# Patient Record
Sex: Female | Born: 1960 | Race: White | Hispanic: No | Marital: Married | Smoking: Never smoker
Health system: Southern US, Community
[De-identification: ages and names within clinical notes are randomized; demographics above are authoritative.]

## PROBLEM LIST (undated history)

## (undated) DIAGNOSIS — D329 Benign neoplasm of meninges, unspecified: Principal | ICD-10-CM

## (undated) DIAGNOSIS — C801 Malignant (primary) neoplasm, unspecified: Principal | ICD-10-CM

## (undated) DIAGNOSIS — E119 Type 2 diabetes mellitus without complications: Secondary | ICD-10-CM

## (undated) DIAGNOSIS — E785 Hyperlipidemia, unspecified: Secondary | ICD-10-CM

## (undated) DIAGNOSIS — K219 Gastro-esophageal reflux disease without esophagitis: Secondary | ICD-10-CM

## (undated) DIAGNOSIS — I10 Essential (primary) hypertension: Secondary | ICD-10-CM

## (undated) DIAGNOSIS — M7711 Lateral epicondylitis, right elbow: Secondary | ICD-10-CM

## (undated) HISTORY — PX: CHOLECYSTECTOMY: SHX55

## (undated) HISTORY — DX: Hyperlipidemia, unspecified: E78.5

## (undated) HISTORY — DX: Gastro-esophageal reflux disease without esophagitis: K21.9

## (undated) HISTORY — DX: Type 2 diabetes mellitus without complications: E11.9

## (undated) HISTORY — DX: Essential (primary) hypertension: I10

## (undated) HISTORY — PX: SHOULDER ARTHROSCOPY: SHX128

---

## 2003-03-15 ENCOUNTER — Other Ambulatory Visit: Payer: Self-pay

## 2004-06-22 ENCOUNTER — Ambulatory Visit: Payer: Self-pay

## 2004-06-26 ENCOUNTER — Ambulatory Visit: Payer: Self-pay

## 2005-07-25 ENCOUNTER — Emergency Department: Payer: Self-pay | Admitting: Emergency Medicine

## 2005-12-01 ENCOUNTER — Ambulatory Visit: Payer: Self-pay

## 2007-04-04 ENCOUNTER — Ambulatory Visit: Payer: Self-pay | Admitting: Internal Medicine

## 2007-05-30 ENCOUNTER — Ambulatory Visit: Payer: Self-pay | Admitting: Gastroenterology

## 2007-08-09 ENCOUNTER — Ambulatory Visit: Payer: Self-pay | Admitting: Internal Medicine

## 2007-09-14 ENCOUNTER — Ambulatory Visit: Payer: Self-pay | Admitting: Gastroenterology

## 2007-09-18 ENCOUNTER — Ambulatory Visit: Payer: Self-pay | Admitting: Gastroenterology

## 2008-02-12 ENCOUNTER — Ambulatory Visit: Payer: Self-pay | Admitting: Unknown Physician Specialty

## 2008-02-27 ENCOUNTER — Encounter: Payer: Self-pay | Admitting: Unknown Physician Specialty

## 2008-03-10 ENCOUNTER — Encounter: Payer: Self-pay | Admitting: Unknown Physician Specialty

## 2008-04-09 ENCOUNTER — Encounter: Payer: Self-pay | Admitting: Unknown Physician Specialty

## 2008-04-11 ENCOUNTER — Ambulatory Visit: Payer: Self-pay | Admitting: Unknown Physician Specialty

## 2008-04-19 ENCOUNTER — Ambulatory Visit: Payer: Self-pay | Admitting: Unknown Physician Specialty

## 2008-05-10 ENCOUNTER — Encounter: Payer: Self-pay | Admitting: Unknown Physician Specialty

## 2008-05-21 ENCOUNTER — Ambulatory Visit: Payer: Self-pay | Admitting: Internal Medicine

## 2008-06-10 ENCOUNTER — Encounter: Payer: Self-pay | Admitting: Unknown Physician Specialty

## 2008-09-30 ENCOUNTER — Ambulatory Visit: Payer: Self-pay | Admitting: Internal Medicine

## 2008-10-02 ENCOUNTER — Ambulatory Visit: Payer: Self-pay | Admitting: Internal Medicine

## 2008-10-23 ENCOUNTER — Ambulatory Visit: Payer: Self-pay | Admitting: Internal Medicine

## 2009-12-31 ENCOUNTER — Ambulatory Visit: Payer: Self-pay | Admitting: Internal Medicine

## 2010-05-10 HISTORY — PX: BREAST BIOPSY: SHX20

## 2010-05-13 ENCOUNTER — Ambulatory Visit: Payer: Self-pay | Admitting: Internal Medicine

## 2011-02-16 ENCOUNTER — Ambulatory Visit: Payer: Self-pay

## 2011-06-29 ENCOUNTER — Ambulatory Visit: Payer: Self-pay | Admitting: General Practice

## 2011-10-26 ENCOUNTER — Ambulatory Visit: Payer: Self-pay | Admitting: Gastroenterology

## 2012-03-03 ENCOUNTER — Ambulatory Visit: Payer: Self-pay

## 2012-04-11 ENCOUNTER — Ambulatory Visit: Payer: Self-pay | Admitting: Anesthesiology

## 2012-04-11 LAB — BASIC METABOLIC PANEL
Calcium, Total: 10.1 mg/dL (ref 8.5–10.1)
Chloride: 105 mmol/L (ref 98–107)
Co2: 26 mmol/L (ref 21–32)
Creatinine: 0.92 mg/dL (ref 0.60–1.30)
EGFR (African American): >60
EGFR (Non-African Amer.): >60
Glucose: 262 mg/dL — ABNORMAL HIGH (ref 65–99)
Potassium: 5.9 mmol/L — ABNORMAL HIGH (ref 3.5–5.1)
Sodium: 137 mmol/L (ref 136–145)

## 2012-04-11 LAB — HEMOGLOBIN: HGB: 12.4 g/dL (ref 12.0–16.0)

## 2012-04-12 ENCOUNTER — Ambulatory Visit: Payer: Self-pay | Admitting: Surgery

## 2012-05-29 ENCOUNTER — Ambulatory Visit: Payer: Self-pay

## 2013-08-09 DIAGNOSIS — E1122 Type 2 diabetes mellitus with diabetic chronic kidney disease: Secondary | ICD-10-CM | POA: Insufficient documentation

## 2013-08-09 DIAGNOSIS — E1165 Type 2 diabetes mellitus with hyperglycemia: Secondary | ICD-10-CM | POA: Insufficient documentation

## 2013-08-09 DIAGNOSIS — E875 Hyperkalemia: Secondary | ICD-10-CM | POA: Insufficient documentation

## 2014-04-17 ENCOUNTER — Encounter: Attending: Hematology & Oncology | Primary: Internal Medicine

## 2014-04-26 MED ORDER — TAMOXIFEN CITRATE 20 MG PO TABS
20 MG | ORAL_TABLET | Freq: Every day | ORAL | Status: DC
Start: 2014-04-26 — End: 2014-10-28

## 2014-05-01 ENCOUNTER — Encounter: Attending: Hematology & Oncology | Primary: Internal Medicine

## 2014-05-15 ENCOUNTER — Encounter: Attending: Hematology & Oncology | Primary: Internal Medicine

## 2014-07-17 ENCOUNTER — Ambulatory Visit
Admit: 2014-07-17 | Discharge: 2014-07-17 | Payer: MEDICAID | Attending: Hematology & Oncology | Primary: Internal Medicine

## 2014-07-17 DIAGNOSIS — C50912 Malignant neoplasm of unspecified site of left female breast: Secondary | ICD-10-CM

## 2014-07-17 NOTE — Progress Notes (Signed)
HPI  Kelly Miles is a 54 y.o.  female who comes in today for follow up. She feels well.  Apettite  and energy levels are good. No pain. No lumps.  No hot flashes. No vaginal dryness or hair thinning.    she has the following oncology history  1. Left breast cancer T2N1M0, ER/PR+, Her2 negative Kelly Miles lumpectomy.  2. Oncotype done and was low risk. She declined S1005 study and recived XRT subsequently.  3. On tamoxifen 87m daily since September 06, 2012      Review of Systems   Constitutional: Negative for fever, fatigue and unexpected weight change.   HENT: Negative for mouth sores and trouble swallowing.    Eyes: Negative for photophobia and visual disturbance.   Respiratory: Negative for cough and shortness of breath.    Cardiovascular: Negative for chest pain.   Gastrointestinal: Negative for nausea, vomiting, abdominal pain and diarrhea.   Genitourinary: Negative for dysuria and hematuria.   Musculoskeletal: Negative for myalgias and arthralgias.   Skin: Negative for rash.   Neurological: Negative for seizures, light-headedness and headaches.   Hematological: Negative for adenopathy. Does not bruise/bleed easily.   Psychiatric/Behavioral: Negative for sleep disturbance.         Family History   Problem Relation Age of Onset   . Cancer Brother      History   Substance Use Topics   . Smoking status: Former SResearch scientist (life sciences)  . Smokeless tobacco: Not on file   . Alcohol Use: Not on file       Filed Vitals:    07/17/14 1126   BP: 144/84   Pulse: 94   Temp: 98 F (36.7 C)   SpO2: 98%     Physical Exam   Constitutional: She is oriented to person, place, and time. No distress.   HENT:   Head: Normocephalic.   Mouth/Throat: No oropharyngeal exudate.   Eyes: EOM are normal. Pupils are equal, round, and reactive to light. No scleral icterus.   Neck: Neck supple.   Cardiovascular: Normal rate, regular rhythm and normal heart sounds.    Pulmonary/Chest: Breath sounds normal.   Abdominal: Soft. She exhibits no distension and no mass.  There is no tenderness. There is no rebound and no guarding.   Musculoskeletal: Normal range of motion.   Lymphadenopathy:     She has no cervical adenopathy.   Neurological: She is alert and oriented to person, place, and time. No cranial nerve deficit.   Skin: No rash noted.   Psychiatric: She has a normal mood and affect.   breast exam: no lumps.    Last mammogram 11/15: negative    Assessment/Plan:  SLiliaswas seen today for follow-up.    Diagnoses and associated orders for this visit:    Breast cancer, stage 2, left ([place]      1. she appears to be in complete remission. Will do only symptom guided imaging   2. Continue tamoxifen at above dose and schedule. Compliance assessed. Patient is taking medication at appropriate dose and schedule.  3. I have counseled her on diet and exercise.  Patient verbalizes understanding and agrees with the plan  4. Yearly mammograms. Survivorship discussed.

## 2014-08-07 ENCOUNTER — Ambulatory Visit
Admit: 2014-08-07 | Disposition: A | Discharge status: Home / Self Care | Payer: Self-pay | Attending: Internal Medicine | Admitting: Internal Medicine

## 2014-08-21 ENCOUNTER — Encounter: Attending: Surgery | Primary: Internal Medicine

## 2014-08-27 NOTE — Op Note (Signed)
PATIENT NAME:  Chelsea Rivera, Chelsea Rivera MR#:  [id number] DATE OF BIRTH:  05/24/1960  DATE OF PROCEDURE:  04/12/2012  PREOPERATIVE DIAGNOSIS: Chronic cholecystitis.   POSTOPERATIVE DIAGNOSIS: Chronic cholecystitis.   PROCEDURE: Laparoscopic cholecystectomy.   SURGEON: Rodena Goldmann, M.D.   ANESTHESIA: General.   OPERATIVE PROCEDURE: With the patient in the supine position after induction of appropriate general anesthesia, the patient's abdomen was prepped with ChloraPrep and draped with sterile towels. The patient was placed in head down, feet up position. A small infraumbilical incision was made in the standard fashion and carried down bluntly through the subcutaneous tissue. The Veress needle was used to cannulate the peritoneal cavity. CO2 was insufflated to appropriate pressure measurements. Approximately 2.5 liters of CO2 were instilled, the Veress needle was withdrawn, and an 11 mm [employer] port was inserted into the peritoneal cavity. Intraperitoneal position was confirmed. CO2 was reinsufflated. The patient was placed in the head up, feet down position and rotated slightly to the left side. A subxiphoid transverse incision was made and an 11 mm port was inserted under direct vision. Two lateral ports 5 mm in size were inserted under direct vision. The gallbladder had multiple adhesions to it. The gallbladder was retracted superiorly and laterally exposing the hepatoduodenal ligament. The cystic artery and cystic duct were identified. There was a very short cystic duct which was very small. The cystic duct was doubly clipped on both sides and divided. The cystic artery was doubly clipped and divided. The gallbladder was then dissected free from its bed and delivered using hook and cautery apparatus. Once the gallbladder was free, the gallbladder was removed through the upper abdominal port using an 11 mm grasping instrument. The area was then copiously suctioned and irrigated. The midline was  examined from the upper port in order to be sure there was no evidence of any bowel or vascular injuries; none were identified. The abdomen was then significantly irrigated and flushed. All gas and liquid was suctioned from the abdominal cavity. All ports were withdrawn without difficulty. Skin incisions were closed with 5-0 nylon     suture. The area was infiltrated with 0.25% Marcaine for postoperative pain control. Sterile dressings were applied. The patient was returned to the recovery room having tolerated the procedure well. Sponge, instrument, and needle counts were correct x2, in the Operating Room. ____________________________ Rodena Goldmann III, MD rle:slb D: 04/12/2012 08:41:11 ET T: 04/12/2012 09:15:08 ET JOB#: [id number]  cc: Micheline Maze, MD, <Dictator> Lavera Guise, MD Rodena Goldmann MD ELECTRONICALLY SIGNED 04/13/2012 17:10

## 2014-08-28 ENCOUNTER — Ambulatory Visit: Admit: 2014-08-28 | Discharge: 2014-08-28 | Payer: MEDICAID | Attending: Surgery | Primary: Internal Medicine

## 2014-08-28 DIAGNOSIS — Z853 Personal history of malignant neoplasm of breast: Secondary | ICD-10-CM

## 2014-08-28 NOTE — Progress Notes (Signed)
Subjective:     Patient: Kelly Miles is a 54 y.o. female     HPI Patient has had some pain which is unchanged,at the lumpectomy site in the UOQ of the left breast.She has had no nipple discharge and no change in self-exams.She has had no fatigue or weight loss but has had decreased ROM of both shoulders felt to be due to her fibromyalgia.She has had a benign appearing yearly mammogram in November of 2015.    Review of Systems   Constitutional: Negative for appetite change and fatigue.   HENT: Negative for voice change.    Respiratory: Negative for cough and chest tightness.    Gastrointestinal: Negative for nausea, vomiting, abdominal pain, diarrhea, constipation and abdominal distention.   Endocrine: Negative for heat intolerance.   Genitourinary: Negative for dysuria, frequency and vaginal bleeding.   Musculoskeletal: Negative for myalgias and arthralgias.   Neurological: Negative for weakness and headaches.   Hematological: Negative for adenopathy.   Psychiatric/Behavioral: Negative for suicidal ideas. The patient is not nervous/anxious.         Allergies   Allergen Reactions   ??? Influenza Vaccines      Current Outpatient Prescriptions on File Prior to Visit   Medication Sig Dispense Refill   ??? SYNTHROID 125 MCG tablet      ??? losartan (COZAAR) 100 MG tablet      ??? traMADol (ULTRAM) 50 MG tablet      ??? Multiple Vitamins-Iron (DAILY-VITE/IRON/BETA-CAROTENE) TABS      ??? vitamin D (CHOLECALCIFEROL) 1000 UNIT TABS tablet Take 1,000 Units by mouth daily     ??? tamoxifen (NOLVADEX) 20 MG tablet Take 1 tablet by mouth daily 30 tablet 5   ??? cetirizine (ZYRTEC) 10 MG tablet        No current facility-administered medications on file prior to visit.      Past Medical History   Diagnosis Date   ??? Cancer ([place])    ??? Chronic back pain    ??? Fibromyalgia    ??? Hypertension    ??? Allergic rhinitis    ??? Hypothyroidism    ??? Irritable bowel syndrome    ??? Restless legs syndrome    ??? Neuropathy       Past Surgical History   Procedure  Laterality Date   ??? Cholecystectomy     ??? Tonsillectomy and adenoidectomy     ??? Breast surgery     ??? Inner ear surgery       both sides   ??? Colonoscopy       Family History   Problem Relation Age of Onset   ??? Cancer Brother      History   Substance Use Topics   ??? Smoking status: Former Smoker   ??? Smokeless tobacco: Not on file   ??? Alcohol Use: Not on file          Objective:     BP 117/70 mmHg   Pulse 87   Ht 5\' 6"  (1.676 m)   Wt 167 lb 12.8 oz (76.114 kg)   BMI 27.10 kg/m2    Physical Exam   Constitutional: Vital signs are normal. She appears well-developed and well-nourished. She is cooperative.   HENT:   Head: Normocephalic.   Eyes: Pupils are equal, round, and reactive to light.   Neck: No tracheal deviation present. No thyromegaly present.   Cardiovascular: Normal rate and normal heart sounds.    No murmur heard.  Pulmonary/Chest: She has no wheezes. She  has no rales. She exhibits no tenderness.       The left breast is surgically smaller than the right and the retracted scar in the UOQ of the left breast is unchanged.There are no masses in either breast or axilla and no supraclavicular masses   Abdominal: She exhibits no distension, no fluid wave and no abdominal bruit. There is no hepatosplenomegaly. There is no guarding, no tenderness at McBurney's point and negative Murphy's sign. No hernia.   Musculoskeletal: She exhibits no edema.   Lymphadenopathy:     She has no cervical adenopathy.   Neurological: No cranial nerve deficit. Coordination normal.   Skin: No rash noted.   Psychiatric: She has a normal mood and affect.       Assessment      1. Personal history of breast cancer         Plan      Follow up in one year,continue with mammograms every November;continue self-exams    Roe Coombs, MD  08/28/14  11:18 AM      Health Maintenance Due   Topic Date Due   ??? HEPATITIS  C SCREENING  1960-11-21   ??? LIPIDS  09/16/1968   ??? TETANUS VACCINE ADULT (11 YEARS AND UP)  09/16/1969   ??? HIV SCREENING   09/16/1973   ??? CERVICAL CANCER SCREENING  09/17/1979   ??? DIABETES SCREENING  09/17/1998   ??? COLON CANCER SCREENING COLONOSCOPY  09/16/2008

## 2014-08-29 DIAGNOSIS — M7711 Lateral epicondylitis, right elbow: Secondary | ICD-10-CM | POA: Insufficient documentation

## 2014-08-29 DIAGNOSIS — M25521 Pain in right elbow: Secondary | ICD-10-CM | POA: Insufficient documentation

## 2014-10-28 MED ORDER — TAMOXIFEN CITRATE 20 MG PO TABS
20 | ORAL_TABLET | ORAL | 5 refills | Status: DC
Start: 2014-10-28 — End: 2015-04-28

## 2015-01-15 ENCOUNTER — Encounter: Attending: Hematology & Oncology | Primary: Internal Medicine

## 2015-03-07 ENCOUNTER — Ambulatory Visit
Admit: 2015-03-07 | Discharge: 2015-03-07 | Payer: MEDICAID | Attending: Hematology & Oncology | Primary: Internal Medicine

## 2015-03-07 DIAGNOSIS — C50912 Malignant neoplasm of unspecified site of left female breast: Secondary | ICD-10-CM

## 2015-03-07 NOTE — Progress Notes (Signed)
C/o pain in shoulders/back thinks it is worse since having radiation / not sleeping much / numb, tingling hands/legs charlie horse in legs at night

## 2015-03-07 NOTE — Progress Notes (Signed)
HPI  Kelly Miles is a 54 y.o.  female who comes in today for follow up. She feels well.  Apettite  and energy levels are good. No pain. No lumps.  No hot flashes. No vaginal dryness or hair thinning.  She is complaining of stiffness and achiness mainly in her left shoulder.    she has the following oncology history  1. Left breast cancer T2N1M0, ER/PR+, Her2 negative s/p lumpectomy.  2. Oncotype done and was low risk. She declined S1005 study and recived XRT subsequently.  3. On tamoxifen 28m daily since September 06, 2012      Review of Systems   Constitutional: Negative for fatigue, fever and unexpected weight change.   HENT: Negative for mouth sores and trouble swallowing.    Eyes: Negative for photophobia and visual disturbance.   Respiratory: Negative for cough and shortness of breath.    Cardiovascular: Negative for chest pain.   Gastrointestinal: Negative for abdominal pain, diarrhea, nausea and vomiting.   Genitourinary: Negative for dysuria and hematuria.   Musculoskeletal: Negative for arthralgias and myalgias.   Skin: Negative for rash.   Neurological: Negative for seizures, light-headedness and headaches.   Hematological: Negative for adenopathy. Does not bruise/bleed easily.   Psychiatric/Behavioral: Negative for sleep disturbance.         Family History   Problem Relation Age of Onset   . Cancer Brother      Social History   Substance Use Topics   . Smoking status: Former SResearch scientist (life sciences)  . Smokeless tobacco: Not on file   . Alcohol use Not on file       Vitals:    03/07/15 1134   BP: 134/83   Pulse: 96   Temp: 98.6 F (37 C)   SpO2: 98%     Physical Exam   Constitutional: She is oriented to person, place, and time. No distress.   HENT:   Head: Normocephalic.   Mouth/Throat: No oropharyngeal exudate.   Eyes: EOM are normal. Pupils are equal, round, and reactive to light. No scleral icterus.   Neck: Neck supple.   Cardiovascular: Normal rate, regular rhythm and normal heart sounds.    Pulmonary/Chest: Breath  sounds normal.   Abdominal: Soft. She exhibits no distension and no mass. There is no tenderness. There is no rebound and no guarding.   Musculoskeletal: Normal range of motion.   Lymphadenopathy:     She has no cervical adenopathy.   Neurological: She is alert and oriented to person, place, and time. No cranial nerve deficit.   Skin: No rash noted.   Psychiatric: She has a normal mood and affect.   breast exam: no lumps.    Last mammogram 11/15: negative    Assessment/Plan:  SJordanwas seen today for follow-up.    Diagnoses and all orders for this visit:    Breast cancer, stage 2, left ([place]  -     PT-[facility], [facility]   1. she appears to be in complete remission. Will do only symptom guided imaging     2.  Left breast cancer stage II ER/PR positive HER-2/neu negative.  Drug:  Tamoxifen 20 mg p.o. daily.  Treatment intent is curative.  Timing is adjuvant.  Planned duration is 5 for 10 years.  Response Rate: NA since adjuvant.  I have discussed some of the side efffects that include nausea, vomiting, alopecia, infection, myelosuppression,  Prognosis: Five-year disease-free survival is 80%.  NCCN guidelines were used.  She  consents to treatment. Shared decision making was performed.    3. I have counseled her on diet and exercise.  Physical therapy as above.  Patient verbalizes understanding and agrees with the plan    4. Yearly mammograms.  Next one due in November 2016.

## 2015-03-07 NOTE — Telephone Encounter (Signed)
Pt here and was given phone number to [facility] at [facility] for PT. Pt to call and set up appt. Faxed order//Form. No precert required [facility]. Lattie Haw

## 2015-04-22 ENCOUNTER — Other Ambulatory Visit: Payer: Self-pay | Admitting: Nurse Practitioner

## 2015-04-22 DIAGNOSIS — Z1231 Encounter for screening mammogram for malignant neoplasm of breast: Secondary | ICD-10-CM

## 2015-04-28 MED ORDER — TAMOXIFEN CITRATE 20 MG PO TABS
20 MG | ORAL_TABLET | ORAL | 3 refills | Status: DC
Start: 2015-04-28 — End: 2015-08-28

## 2015-08-08 ENCOUNTER — Ambulatory Visit
Admission: RE | Admit: 2015-08-08 | Discharge: 2015-08-08 | Disposition: A | Discharge status: Home / Self Care | Payer: Managed Care, Other (non HMO) | Source: Ambulatory Visit | Attending: Nurse Practitioner | Admitting: Nurse Practitioner

## 2015-08-08 DIAGNOSIS — Z1231 Encounter for screening mammogram for malignant neoplasm of breast: Secondary | ICD-10-CM | POA: Insufficient documentation

## 2015-08-28 MED ORDER — TAMOXIFEN CITRATE 20 MG PO TABS
20 | ORAL_TABLET | ORAL | 5 refills | Status: DC
Start: 2015-08-28 — End: 2016-02-26

## 2015-09-01 ENCOUNTER — Encounter: Attending: Hematology & Oncology | Primary: Internal Medicine

## 2015-09-05 ENCOUNTER — Encounter: Attending: Hematology & Oncology | Primary: Internal Medicine

## 2015-10-08 ENCOUNTER — Other Ambulatory Visit: Payer: Self-pay

## 2015-10-08 ENCOUNTER — Encounter (INDEPENDENT_AMBULATORY_CARE_PROVIDER_SITE_OTHER): Payer: Self-pay

## 2015-10-08 DIAGNOSIS — Z299 Encounter for prophylactic measures, unspecified: Secondary | ICD-10-CM

## 2015-10-08 NOTE — Progress Notes (Signed)
Patient came in to have blood drawn per Dr. Leretha Pol at [facility] orders.  Blood was drawn from the right arm without any incident.

## 2015-10-09 LAB — CMP12+LP+TP+TSH+6AC+CBC/D/PLT
A/G RATIO: 1.5 (ref 1.2–2.2)
ALT: 13 IU/L (ref 0–32)
AST: 14 IU/L (ref 0–40)
Albumin: 4.1 g/dL (ref 3.5–5.5)
Alkaline Phosphatase: 73 IU/L (ref 39–117)
BASOS ABS: 0 10*3/uL (ref 0.0–0.2)
BILIRUBIN TOTAL: 0.2 mg/dL (ref 0.0–1.2)
BUN / CREAT RATIO: 24 — AB (ref 9–23)
BUN: 24 mg/dL (ref 6–24)
Basos: 1 %
CREATININE: 0.99 mg/dL (ref 0.57–1.00)
Calcium: 9.7 mg/dL (ref 8.7–10.2)
Chloride: 100 mmol/L (ref 96–106)
Chol/HDL Ratio: 2.8 ratio units (ref 0.0–4.4)
Cholesterol, Total: 150 mg/dL (ref 100–199)
EOS (ABSOLUTE): 0.3 10*3/uL (ref 0.0–0.4)
EOS: 4 %
Estimated CHD Risk: <0.5 times avg. (ref 0.0–1.0)
Free Thyroxine Index: 2.2 (ref 1.2–4.9)
GFR calc Af Amer: 73 mL/min/{1.73_m2} (ref >59)
GFR, EST NON AFRICAN AMERICAN: 63 mL/min/{1.73_m2} (ref >59)
GGT: 8 IU/L (ref 0–60)
GLUCOSE: 184 mg/dL — AB (ref 65–99)
Globulin, Total: 2.7 g/dL (ref 1.5–4.5)
HDL: 54 mg/dL (ref >39)
HEMOGLOBIN: 11.3 g/dL (ref 11.1–15.9)
Hematocrit: 35.9 % (ref 34.0–46.6)
IMMATURE GRANS (ABS): 0 10*3/uL (ref 0.0–0.1)
IMMATURE GRANULOCYTES: 0 %
IRON: 42 ug/dL (ref 27–159)
LDH: 157 IU/L (ref 119–226)
LDL Calculated: 80 mg/dL (ref 0–99)
LYMPHS: 35 %
Lymphocytes Absolute: 2.2 10*3/uL (ref 0.7–3.1)
MCH: 27.4 pg (ref 26.6–33.0)
MCHC: 31.5 g/dL (ref 31.5–35.7)
MCV: 87 fL (ref 79–97)
MONOCYTES: 7 %
Monocytes Absolute: 0.4 10*3/uL (ref 0.1–0.9)
NEUTROS PCT: 53 %
Neutrophils Absolute: 3.3 10*3/uL (ref 1.4–7.0)
PLATELETS: 275 10*3/uL (ref 150–379)
Phosphorus: 4.1 mg/dL (ref 2.5–4.5)
Potassium: 5.2 mmol/L (ref 3.5–5.2)
RBC: 4.13 x10E6/uL (ref 3.77–5.28)
RDW: 15.1 % (ref 12.3–15.4)
Sodium: 139 mmol/L (ref 134–144)
T3 UPTAKE RATIO: 30 % (ref 24–39)
T4, Total: 7.4 ug/dL (ref 4.5–12.0)
TOTAL PROTEIN: 6.8 g/dL (ref 6.0–8.5)
TSH: 2.15 u[IU]/mL (ref 0.450–4.500)
Triglycerides: 80 mg/dL (ref 0–149)
URIC ACID: 6.6 mg/dL (ref 2.5–7.1)
VLDL CHOLESTEROL CAL: 16 mg/dL (ref 5–40)
WBC: 6.2 10*3/uL (ref 3.4–10.8)

## 2015-10-09 LAB — MICROALBUMIN / CREATININE URINE RATIO
Creatinine, Urine: 103.8 mg/dL
MICROALB/CREAT RATIO: 15.7 mg/g creat (ref 0.0–30.0)
Microalbumin, Urine: 16.3 ug/mL

## 2015-10-09 LAB — T4, FREE: Free T4: 1.32 ng/dL (ref 0.82–1.77)

## 2015-10-09 LAB — HGB A1C W/O EAG: Hgb A1c MFr Bld: 8.8 % — ABNORMAL HIGH (ref 4.8–5.6)

## 2015-10-09 LAB — VITAMIN D 25 HYDROXY (VIT D DEFICIENCY, FRACTURES): Vit D, 25-Hydroxy: 40.6 ng/mL (ref 30.0–100.0)

## 2016-02-26 MED ORDER — TAMOXIFEN CITRATE 20 MG PO TABS
20 MG | ORAL_TABLET | Freq: Every day | ORAL | 0 refills | Status: DC
Start: 2016-02-26 — End: 2016-04-05

## 2016-02-26 NOTE — Telephone Encounter (Signed)
Pharmacy called requesting refill on Tamoxifen 20 mg

## 2016-02-26 NOTE — Telephone Encounter (Signed)
Left message for pt to call and schedule folllow-up visit.

## 2016-03-15 ENCOUNTER — Ambulatory Visit
Admit: 2016-03-15 | Discharge: 2016-03-15 | Payer: MEDICAID | Attending: Hematology & Oncology | Primary: Internal Medicine

## 2016-03-15 DIAGNOSIS — C50912 Malignant neoplasm of unspecified site of left female breast: Secondary | ICD-10-CM

## 2016-03-15 NOTE — Progress Notes (Signed)
HPI  Kelly Miles is a 55 y.o.  female who comes in today for follow up. She feels well.  Apettite  and energy levels are good. No pain. No lumps.  No hot flashes. No vaginal dryness or hair thinning.      she has the following oncology history  1. Left breast cancer T2N1M0, ER/PR+, Her2 negative s/p lumpectomy.  2. Oncotype done and was low risk. She declined S1005 study and recived XRT subsequently.  3. On tamoxifen 55m daily since September 06, 2012      Review of Systems   Constitutional: Negative for fatigue, fever and unexpected weight change.   HENT: Negative for mouth sores and trouble swallowing.    Eyes: Negative for photophobia and visual disturbance.   Respiratory: Negative for cough and shortness of breath.    Cardiovascular: Negative for chest pain.   Gastrointestinal: Negative for abdominal pain, diarrhea, nausea and vomiting.   Genitourinary: Negative for dysuria and hematuria.   Musculoskeletal: Negative for arthralgias and myalgias.   Skin: Negative for rash.   Neurological: Negative for seizures, light-headedness and headaches.   Hematological: Negative for adenopathy. Does not bruise/bleed easily.   Psychiatric/Behavioral: Negative for sleep disturbance.         Family History   Problem Relation Age of Onset   . Cancer Brother      Social History   Substance Use Topics   . Smoking status: Former SResearch scientist (life sciences)  . Smokeless tobacco: Never Used   . Alcohol use Not on file       Vitals:    03/15/16 1110   BP: (!) 151/89   Pulse: 113   Temp: 97.8 F (36.6 C)   SpO2: 100%     Physical Exam   Constitutional: She is oriented to person, place, and time. No distress.   HENT:   Head: Normocephalic.   Mouth/Throat: No oropharyngeal exudate.   Eyes: EOM are normal. Pupils are equal, round, and reactive to light. No scleral icterus.   Neck: Neck supple.   Cardiovascular: Normal rate, regular rhythm and normal heart sounds.    Pulmonary/Chest: Breath sounds normal.   Abdominal: Soft. She exhibits no distension and  no mass. There is no tenderness. There is no rebound and no guarding.   Musculoskeletal: Normal range of motion.   Lymphadenopathy:     She has no cervical adenopathy.   Neurological: She is alert and oriented to person, place, and time. No cranial nerve deficit.   Skin: No rash noted.   Psychiatric: She has a normal mood and affect.   breast exam: no lumps.    Last mammogram 09/27/2015 was negative.    Assessment/Plan:  SSusywas seen today for follow-up.    Diagnoses and all orders for this visit:    Breast cancer, stage 2, left ([place]    1. she appears to be in complete remission. Will do only symptom guided imaging. Follow-up in 1 year for office visit. She will need distress and depression screening at that visit.    2.  Left breast cancer stage II ER/PR positive HER-2/neu negative.  Drug:  Tamoxifen 20 mg p.o. daily.  Treatment intent is curative.  Timing is adjuvant.  Planned duration is 5 for 10 years.  Response Rate: NA since adjuvant.  I have discussed some of the side efffects that include nausea, vomiting, alopecia, infection, myelosuppression,  Prognosis: Five-year disease-free survival is 80%.  NCCN guidelines were used.  She consents to treatment. Shared decision  making was performed.    3. I have counseled her on diet and exercise.  Patient verbalizes understanding and agrees with the plan    4. Yearly mammograms.  Next one due Feb 2018.

## 2016-04-05 MED ORDER — TAMOXIFEN CITRATE 20 MG PO TABS
20 MG | ORAL_TABLET | Freq: Every day | ORAL | 5 refills | Status: DC
Start: 2016-04-05 — End: 2016-09-23

## 2016-04-05 NOTE — Telephone Encounter (Signed)
Patient requesting a refill for Tamoxifen 20 MG to be sent to the [facility] on file.  She would like a call to let her know it was sent to the pharmacy.

## 2016-05-14 ENCOUNTER — Ambulatory Visit: Payer: Self-pay | Admitting: Physician Assistant

## 2016-05-14 VITALS — BP 101/60 | HR 86 | Temp 98.6°F

## 2016-05-14 DIAGNOSIS — J101 Influenza due to other identified influenza virus with other respiratory manifestations: Secondary | ICD-10-CM

## 2016-05-14 LAB — POCT INFLUENZA A/B
INFLUENZA A, POC: NEGATIVE
INFLUENZA B, POC: POSITIVE — AB

## 2016-05-14 NOTE — Progress Notes (Signed)
S: C/o runny nose and congestion with dry cough for 2 days, + fever, chills, denies cp/sob, v/d; mucus was green this am but clear throughout the day, cough is sporadic, friend that is staying with them is + flu  Using otc meds: robitussin  O: PE: vitals wnl, nad,  perrl eomi, normocephalic, tms dull, nasal mucosa red and swollen, throat injected, neck supple no lymph, lungs c t a, cv rrr, neuro intact, flu swab   A:  Acute influenza B   P: drink fluids, continue regular meds , use otc meds of choice, return if not improving in 5 days, return earlier if worsening , tamiflu, tussionex, omnicef if secondary infection, preventive tamiflu for her husband Asheley Tongate,

## 2016-05-17 ENCOUNTER — Ambulatory Visit: Payer: Self-pay | Admitting: Physician Assistant

## 2016-05-17 ENCOUNTER — Encounter: Payer: Self-pay | Admitting: Physician Assistant

## 2016-05-17 VITALS — BP 110/60 | HR 76 | Temp 97.8°F

## 2016-05-17 DIAGNOSIS — R0789 Other chest pain: Secondary | ICD-10-CM

## 2016-05-17 NOTE — Progress Notes (Signed)
S: here for recheck, had +flu B last week, taking tamiflu and also omnicef to prevent secondary infection, had some chest soreness and discomfort over the weekend, some in her back also, no radiation of pain, no sob or sweating but did get nauseated, no fever/chills, no v/d, sx are better but still really sore, ?if we could just listen to her chest to make sure its clear  O: vitals wnl, nad, tms clear, throat wnl, neck supple no lymph, lungs c t a, cv rrr, muscles in upper shoulders tight, chest and back nontender to palp, n/v intact, ekg wnl  A: chest discomfort secondary to flu B  P: return if worsening, call 911 if increased chest pain and go to ER, finish medications

## 2016-07-05 ENCOUNTER — Encounter: Payer: Self-pay | Admitting: Physician Assistant

## 2016-07-05 ENCOUNTER — Ambulatory Visit: Payer: Self-pay | Admitting: Physician Assistant

## 2016-07-05 VITALS — BP 100/60 | HR 80 | Temp 98.0°F

## 2016-07-05 DIAGNOSIS — N390 Urinary tract infection, site not specified: Secondary | ICD-10-CM

## 2016-07-05 LAB — POCT URINALYSIS DIPSTICK
Bilirubin, UA: NEGATIVE
NITRITE UA: POSITIVE
Spec Grav, UA: 1.025
Urobilinogen, UA: 0.2
pH, UA: 5

## 2016-07-05 MED ORDER — FLUCONAZOLE 150 MG PO TABS: 150.0000 mg | ORAL_TABLET | Freq: Once | ORAL | 0 refills | Status: AC

## 2016-07-05 MED ORDER — CIPROFLOXACIN HCL 250 MG PO TABS: 250.0000 mg | ORAL_TABLET | Freq: Two times a day (BID) | ORAL | 0 refills | Status: DC

## 2016-07-05 NOTE — Progress Notes (Signed)
S:  C/o uti sx for 2 weeks, burning, urgency, frequency, denies vaginal discharge, abdominal pain or flank pain: tried cranberry pills without relief;  Remainder ros neg  O:  Vitals wnl, nad, no cva tenderness, back nontender, lungs c t a,cv rrr, abd soft nontender, bs normal, n/v intact, ua 1+ leuks, +nitrites  A: uti  P: cipro 250mg  bid x 7d, diflucan if needed, increase water intake, add cranberry juice, return if not improving in 2 -3 days, return earlier if worsening, discussed pyelonephritis sx

## 2016-07-22 ENCOUNTER — Encounter: Primary: Internal Medicine

## 2016-08-16 ENCOUNTER — Other Ambulatory Visit: Payer: Self-pay | Admitting: Nurse Practitioner

## 2016-08-16 DIAGNOSIS — Z1231 Encounter for screening mammogram for malignant neoplasm of breast: Secondary | ICD-10-CM

## 2016-08-25 NOTE — Other (Unsigned)
Patient Acct Nbr: [id number]   Primary AUTH/CERT:   [place] Name: [place] name: [facility]  Primary Insurance Group Number:   Primary Insurance Plan Type: Health  Primary Insurance Policy Number: [id number]

## 2016-08-26 ENCOUNTER — Inpatient Hospital Stay: Attending: Surgery | Primary: Internal Medicine

## 2016-09-01 ENCOUNTER — Ambulatory Visit: Admit: 2016-09-01 | Discharge: 2016-09-01 | Payer: MEDICAID | Attending: Surgery | Primary: Internal Medicine

## 2016-09-01 DIAGNOSIS — C50912 Malignant neoplasm of unspecified site of left female breast: Secondary | ICD-10-CM

## 2016-09-01 NOTE — Progress Notes (Signed)
Subjective:     Patient: Kelly Miles is a 56 y.o. female     HPI patient has a greater than 10 year history of fibromyalgia and she has had chronic breast pain left greater than right since I have known her.  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  She has been on tamoxifen since 2014.  She has a low Oncotype score and no chemotherapy.  She has had no nipple discharge mastalgia or change in self exam.  She has had continued pain unchanged since her surgery and radiation therapy.  Unfortunately, the breast center would not do her mammogram in November and did not notify my office so that is over a year since her last mammogram    Review of Systems   Constitutional: Negative for appetite change and fatigue.   HENT: Negative for voice change.    Respiratory: Negative for cough and chest tightness.    Gastrointestinal: Negative for abdominal distention, abdominal pain, constipation, diarrhea, nausea and vomiting.   Endocrine: Negative for heat intolerance.   Genitourinary: Negative for dysuria, frequency and vaginal bleeding.   Musculoskeletal: Negative for arthralgias and myalgias.   Neurological: Negative for weakness and headaches.   Hematological: Negative for adenopathy.   Psychiatric/Behavioral: Negative for suicidal ideas. The patient is not nervous/anxious.         Allergies   Allergen Reactions   . Influenza Vaccines Nausea And Vomiting     Current Outpatient Prescriptions on File Prior to Visit   Medication Sig Dispense Refill   . tamoxifen (NOLVADEX) 20 MG tablet Take 1 tablet by mouth daily 30 tablet 5   . Multiple Vitamins-Minerals (THERAPEUTIC MULTIVITAMIN-MINERALS) tablet Take 1 tablet by mouth daily     . meloxicam (MOBIC) 7.5 MG tablet      . ibuprofen (ADVIL;MOTRIN) 800 MG tablet   2   . cyclobenzaprine (FLEXERIL) 10 MG tablet   5   .  SYNTHROID 125 MCG tablet      . losartan (COZAAR) 100 MG tablet      . traMADol (ULTRAM) 50 MG tablet      . cetirizine (ZYRTEC) 10 MG tablet      . vitamin D (CHOLECALCIFEROL) 1000 UNIT TABS tablet Take 1,000 Units by mouth daily       No current facility-administered medications on file prior to visit.       Past Medical History:   Diagnosis Date   . Allergic rhinitis    . Cancer ([place])    . Chronic back pain    . Fibromyalgia    . Hypertension    . Hypothyroidism    . Irritable bowel syndrome    . Neuropathy ([place])    . Osteoarthritis     back   . Restless legs syndrome       Past Surgical History:   Procedure Laterality Date   . BREAST SURGERY     . CHOLECYSTECTOMY     . COLONOSCOPY     . INNER EAR SURGERY      both sides   . TONSILLECTOMY AND ADENOIDECTOMY       Family History   Problem Relation Age of Onset   . Cancer Brother    . Other Brother      allergies   . High Blood Pressure Mother    . Stroke  Mother    . Heart Disease Father    . High Blood Pressure Sister    . Other Sister      allergies     Social History   Substance Use Topics   . Smoking status: Former Research scientist (life sciences)   . Smokeless tobacco: Never Used   . Alcohol use No          Objective:     BP (!) 187/98   Pulse 110   Ht 5' 6"  (1.676 m)   Wt 181 lb 9.6 oz (82.4 kg)   Breastfeeding? No   BMI 29.31 kg/m     Physical Exam   Constitutional: Vital signs are normal. She appears well-developed and well-nourished. She is cooperative.   HENT:   Head: Normocephalic.   Eyes: Pupils are equal, round, and reactive to light.   Neck: No tracheal deviation present. No thyromegaly present.   Cardiovascular: Normal rate and normal heart sounds.    No murmur heard.  Pulmonary/Chest: She has no wheezes. She has no rales. She exhibits no tenderness.       Patient's left breast is surgically smaller than the right and there are some retraction of the scar which is chronic in nature on the left side there is some firmness at the superior aspect of the midportion of the  scar in the left breast unchanged    There are no discrete masses in either breast or axilla no nipple discharge no nipple or skin retraction on the right side there is no axillary mass on either side no cervical or supra clavicular masses on either side   Abdominal: She exhibits no distension, no fluid wave and no abdominal bruit. There is no hepatosplenomegaly. There is no guarding, no tenderness at McBurney's point and negative Murphy's sign. No hernia.   Musculoskeletal: She exhibits no edema.   Lymphadenopathy:     She has no cervical adenopathy.   Neurological: No cranial nerve deficit. Coordination normal.   Skin: No rash noted.   Psychiatric: She has a normal mood and affect.       Assessment      1. Breast cancer, stage 2, left ([place])         Plan      1. Breast cancer, stage 2, left ([facility])  Obtain diagnostic bilateral mammogram and left breast ultrasound-patient's mastalgia is most probably chronic in nature and related both to her breast cancer treatment and her underlying fibromyalgia; we will call her with the imaging reports and I will see her again in 6 months and she will call 30 problems    Kelly Coombs, MD  09/01/16  9:43 AM      Health Maintenance Due   Topic Date Due   . Potassium monitoring  [birth year]   . Creatinine monitoring  1961-03-09   . Hepatitis C screen  1960/11/06   . HIV screen  09/16/1973   . DTaP/Tdap/Td vaccine (1 - Tdap) 09/16/1977   . Cervical cancer screen  09/17/1979   . Lipid screen  09/17/1998   . Diabetes screen  09/17/1998   . Shingles Vaccine (1 of 2 - 2 Dose Series) 09/16/2008   . Colon cancer screen colonoscopy  09/16/2008

## 2016-09-08 ENCOUNTER — Ambulatory Visit
Admission: RE | Admit: 2016-09-08 | Discharge: 2016-09-08 | Disposition: A | Discharge status: Home / Self Care | Payer: Managed Care, Other (non HMO) | Source: Ambulatory Visit | Attending: Nurse Practitioner | Admitting: Nurse Practitioner

## 2016-09-08 DIAGNOSIS — Z1231 Encounter for screening mammogram for malignant neoplasm of breast: Secondary | ICD-10-CM | POA: Diagnosis present

## 2016-09-24 ENCOUNTER — Inpatient Hospital Stay: Admit: 2016-09-24 | Attending: Surgery | Primary: Internal Medicine

## 2016-09-24 DIAGNOSIS — C50912 Malignant neoplasm of unspecified site of left female breast: Secondary | ICD-10-CM

## 2016-09-24 MED ORDER — TAMOXIFEN CITRATE 20 MG PO TABS
20 MG | ORAL_TABLET | ORAL | 3 refills | Status: DC
Start: 2016-09-24 — End: 2018-03-21

## 2016-09-24 NOTE — Other (Unsigned)
Patient Acct Nbr: [id number]   Primary AUTH/CERT:   [place] Name: [place] name: [facility]  Primary Insurance Group Number:   Primary Insurance Plan Type: Health  Primary Insurance Policy Number: [id number]

## 2016-09-27 NOTE — Telephone Encounter (Signed)
Name of Caller: Cadey Bazile phone number: [phone]    Relationship to Patient: patient    Chief Complaint/Reason for Call: Pt calling for mammogram results. Please call back to discuss.    Best time of day caller can be reached: Any      Patient advised that office/PCP has 24-48 business hours to return their call: Yes

## 2016-09-28 NOTE — Telephone Encounter (Signed)
Left message that mammogram was benign, recheck in one year.

## 2017-02-25 ENCOUNTER — Other Ambulatory Visit: Payer: Self-pay

## 2017-02-25 DIAGNOSIS — Z299 Encounter for prophylactic measures, unspecified: Secondary | ICD-10-CM

## 2017-02-25 NOTE — Progress Notes (Signed)
Patient came in to have blood drawn for testing per Dr. Etta Quill orders.

## 2017-02-26 LAB — CMP12+LP+TP+TSH+6AC+CBC/D/PLT
A/G RATIO: 1.6 (ref 1.2–2.2)
ALBUMIN: 4.1 g/dL (ref 3.5–5.5)
ALT: 15 IU/L (ref 0–32)
AST: 15 IU/L (ref 0–40)
Alkaline Phosphatase: 72 IU/L (ref 39–117)
BUN / CREAT RATIO: 25 — AB (ref 9–23)
BUN: 25 mg/dL — AB (ref 6–24)
Basophils Absolute: 0 10*3/uL (ref 0.0–0.2)
Basos: 1 %
Bilirubin Total: <0.2 mg/dL (ref 0.0–1.2)
CHOL/HDL RATIO: 2.8 ratio (ref 0.0–4.4)
CREATININE: 0.99 mg/dL (ref 0.57–1.00)
Calcium: 9.6 mg/dL (ref 8.7–10.2)
Chloride: 109 mmol/L — ABNORMAL HIGH (ref 96–106)
Cholesterol, Total: 149 mg/dL (ref 100–199)
EOS (ABSOLUTE): 0.2 10*3/uL (ref 0.0–0.4)
Eos: 4 %
Free Thyroxine Index: 2.2 (ref 1.2–4.9)
GFR calc Af Amer: 73 mL/min/{1.73_m2} (ref >59)
GFR, EST NON AFRICAN AMERICAN: 63 mL/min/{1.73_m2} (ref >59)
GGT: 8 IU/L (ref 0–60)
GLUCOSE: 97 mg/dL (ref 65–99)
Globulin, Total: 2.6 g/dL (ref 1.5–4.5)
HDL: 54 mg/dL (ref >39)
Hematocrit: 33.2 % — ABNORMAL LOW (ref 34.0–46.6)
Hemoglobin: 10.5 g/dL — ABNORMAL LOW (ref 11.1–15.9)
Immature Grans (Abs): 0 10*3/uL (ref 0.0–0.1)
Immature Granulocytes: 0 %
Iron: 46 ug/dL (ref 27–159)
LDH: 182 IU/L (ref 119–226)
LDL Calculated: 81 mg/dL (ref 0–99)
LYMPHS ABS: 2 10*3/uL (ref 0.7–3.1)
Lymphs: 33 %
MCH: 27.3 pg (ref 26.6–33.0)
MCHC: 31.6 g/dL (ref 31.5–35.7)
MCV: 86 fL (ref 79–97)
Monocytes Absolute: 0.4 10*3/uL (ref 0.1–0.9)
Monocytes: 7 %
NEUTROS ABS: 3.4 10*3/uL (ref 1.4–7.0)
Neutrophils: 55 %
PHOSPHORUS: 3.6 mg/dL (ref 2.5–4.5)
POTASSIUM: 6.2 mmol/L — AB (ref 3.5–5.2)
Platelets: 260 10*3/uL (ref 150–379)
RBC: 3.85 x10E6/uL (ref 3.77–5.28)
RDW: 14.5 % (ref 12.3–15.4)
Sodium: 145 mmol/L — ABNORMAL HIGH (ref 134–144)
T3 Uptake Ratio: 28 % (ref 24–39)
T4 TOTAL: 8 ug/dL (ref 4.5–12.0)
TOTAL PROTEIN: 6.7 g/dL (ref 6.0–8.5)
TSH: 2.62 u[IU]/mL (ref 0.450–4.500)
Triglycerides: 68 mg/dL (ref 0–149)
URIC ACID: 7.1 mg/dL (ref 2.5–7.1)
VLDL Cholesterol Cal: 14 mg/dL (ref 5–40)
WBC: 6.1 10*3/uL (ref 3.4–10.8)

## 2017-02-26 LAB — VITAMIN D 25 HYDROXY (VIT D DEFICIENCY, FRACTURES): Vit D, 25-Hydroxy: 29.5 ng/mL — ABNORMAL LOW (ref 30.0–100.0)

## 2017-02-26 LAB — HGB A1C W/O EAG: Hgb A1c MFr Bld: 7.9 % — ABNORMAL HIGH (ref 4.8–5.6)

## 2017-03-16 ENCOUNTER — Ambulatory Visit
Admit: 2017-03-16 | Discharge: 2017-03-16 | Payer: MEDICAID | Attending: Hematology & Oncology | Primary: Internal Medicine

## 2017-03-16 DIAGNOSIS — C50912 Malignant neoplasm of unspecified site of left female breast: Secondary | ICD-10-CM

## 2017-03-16 NOTE — Progress Notes (Signed)
HPI  Kelly Miles is a 56 y.o.  female who comes in today for follow up. She feels well.  Apettite  and energy levels are good. No pain. No lumps.  No hot flashes. No vaginal dryness or hair thinning.    She is struggling with fibromyalgia.    she has the following oncology history  1. Left breast cancer T2N1M0, ER/PR+, Her2 negative s/p lumpectomy.  2. Oncotype done and was low risk. She declined S1005 study and recived XRT subsequently.  3. On tamoxifen 32m daily since 2014      Review of Systems   Constitutional: Negative for fatigue, fever and unexpected weight change.   HENT: Negative for mouth sores and trouble swallowing.    Eyes: Negative for photophobia and visual disturbance.   Respiratory: Negative for cough and shortness of breath.    Cardiovascular: Negative for chest pain.   Gastrointestinal: Negative for abdominal pain, diarrhea, nausea and vomiting.   Genitourinary: Negative for dysuria and hematuria.   Musculoskeletal: Negative for arthralgias and myalgias.   Skin: Negative for rash.   Neurological: Negative for seizures, light-headedness and headaches.   Hematological: Negative for adenopathy. Does not bruise/bleed easily.   Psychiatric/Behavioral: Negative for sleep disturbance.         Family History   Problem Relation Age of Onset   . Cancer Brother    . Other Brother         allergies   . High Blood Pressure Mother    . Stroke Mother    . Heart Disease Father    . High Blood Pressure Sister    . Other Sister         allergies     Social History   Substance Use Topics   . Smoking status: Former SResearch scientist (life sciences)  . Smokeless tobacco: Never Used   . Alcohol use No       Vitals:    03/16/17 1118   BP: 110/68   Pulse: 75   Temp: 98 F (36.7 C)   SpO2: 98%     Physical Exam   Constitutional: She is oriented to person, place, and time. No distress.   HENT:   Head: Normocephalic.   Mouth/Throat: No oropharyngeal exudate.   Eyes: Pupils are equal, round, and reactive to light. EOM are normal. No scleral  icterus.   Neck: Neck supple.   Cardiovascular: Normal rate, regular rhythm and normal heart sounds.    Pulmonary/Chest: Breath sounds normal.   Abdominal: Soft. She exhibits no distension and no mass. There is no tenderness. There is no rebound and no guarding.   Musculoskeletal: Normal range of motion.   Lymphadenopathy:     She has no cervical adenopathy.   Neurological: She is alert and oriented to person, place, and time. No cranial nerve deficit.   Skin: No rash noted.   Psychiatric: She has a normal mood and affect.   breast exam: no lumps.    Last mammogram 09/27/2015 was negative.    Assessment/Plan:  SChelseiwas seen today for follow-up.    Diagnoses and all orders for this visit:    Breast cancer, stage 2, left ([facility]    1. she appears to be in complete remission. Will do only symptom guided imaging. Follow-up in 1 year for office visit.     2.  She is struggling with fibromyalgia.  She has comleted 5 years of tamoxifen.  We discussed further anti-estrogen treatment in the form of containing tamoxifen or  switching to AI.  Given her back problems and struggle with fibromyalgias we decided to stop anti-estrogen therapy since in her case risk-benefit ratio is more in favor of stopping it.  3.  Continue yearly mammograms.  Follow-up with me for office visit in one year.

## 2017-03-25 ENCOUNTER — Other Ambulatory Visit: Payer: Self-pay | Admitting: Physician Assistant

## 2017-03-25 ENCOUNTER — Other Ambulatory Visit: Payer: Self-pay

## 2017-03-25 DIAGNOSIS — Z299 Encounter for prophylactic measures, unspecified: Secondary | ICD-10-CM

## 2017-03-25 NOTE — Progress Notes (Signed)
Patient came in to have blood drawn for testing per Nira Conn Boscia's orders.

## 2017-03-26 LAB — BASIC METABOLIC PANEL
BUN/Creatinine Ratio: 21 (ref 9–23)
BUN: 24 mg/dL (ref 6–24)
CHLORIDE: 101 mmol/L (ref 96–106)
CO2: 23 mmol/L (ref 20–29)
Calcium: 10 mg/dL (ref 8.7–10.2)
Creatinine, Ser: 1.14 mg/dL — ABNORMAL HIGH (ref 0.57–1.00)
GFR, EST AFRICAN AMERICAN: 61 mL/min/{1.73_m2} (ref >59)
GFR, EST NON AFRICAN AMERICAN: 53 mL/min/{1.73_m2} — AB (ref >59)
Glucose: 162 mg/dL — ABNORMAL HIGH (ref 65–99)
POTASSIUM: 5.4 mmol/L — AB (ref 3.5–5.2)
SODIUM: 139 mmol/L (ref 134–144)

## 2017-03-28 NOTE — Progress Notes (Signed)
Here is a copy of your patient's labs that were drawn in the employee clinic

## 2017-06-01 ENCOUNTER — Other Ambulatory Visit: Payer: Self-pay | Admitting: Nurse Practitioner

## 2017-06-01 ENCOUNTER — Other Ambulatory Visit: Payer: Self-pay

## 2017-06-01 DIAGNOSIS — F5101 Primary insomnia: Secondary | ICD-10-CM

## 2017-06-01 MED ORDER — FUROSEMIDE 20 MG PO TABS: 20.0000 mg | ORAL_TABLET | Freq: Every day | ORAL | 3 refills | Status: DC | PRN

## 2017-06-01 MED ORDER — LINZESS 145 MCG PO CAPS: 145.0000 ug | ORAL_CAPSULE | Freq: Every day | ORAL | 3 refills | Status: DC

## 2017-06-01 MED ORDER — ATORVASTATIN CALCIUM 10 MG PO TABS: ORAL_TABLET | ORAL | 3 refills | Status: DC

## 2017-06-01 MED ORDER — ZOLPIDEM TARTRATE 10 MG PO TABS: 10.0000 mg | ORAL_TABLET | Freq: Every evening | ORAL | 3 refills | Status: DC | PRN

## 2017-06-01 NOTE — Progress Notes (Signed)
Renewed zolpidem 10mg  at bedtime as needed. #30 with 3 refills .

## 2017-06-10 ENCOUNTER — Other Ambulatory Visit: Payer: Self-pay

## 2017-06-10 ENCOUNTER — Ambulatory Visit: Payer: Managed Care, Other (non HMO) | Admitting: Nurse Practitioner

## 2017-06-10 ENCOUNTER — Encounter: Payer: Self-pay | Admitting: Nurse Practitioner

## 2017-06-10 VITALS — BP 140/80 | HR 78 | Resp 16 | Ht 63.0 in | Wt 176.0 lb

## 2017-06-10 DIAGNOSIS — I10 Essential (primary) hypertension: Secondary | ICD-10-CM | POA: Diagnosis not present

## 2017-06-10 DIAGNOSIS — K639 Disease of intestine, unspecified: Secondary | ICD-10-CM

## 2017-06-10 DIAGNOSIS — Z0001 Encounter for general adult medical examination with abnormal findings: Secondary | ICD-10-CM

## 2017-06-10 DIAGNOSIS — E1165 Type 2 diabetes mellitus with hyperglycemia: Secondary | ICD-10-CM | POA: Diagnosis not present

## 2017-06-10 DIAGNOSIS — E782 Mixed hyperlipidemia: Secondary | ICD-10-CM

## 2017-06-10 LAB — POCT GLYCOSYLATED HEMOGLOBIN (HGB A1C): HEMOGLOBIN A1C: 8.2

## 2017-06-10 MED ORDER — RIFAXIMIN 550 MG PO TABS: 550.0000 mg | ORAL_TABLET | Freq: Two times a day (BID) | ORAL | 0 refills | Status: DC

## 2017-06-10 MED ORDER — INSULIN DETEMIR 100 UNIT/ML FLEXPEN: PEN_INJECTOR | SUBCUTANEOUS | 11 refills | Status: DC

## 2017-06-10 MED ORDER — INSULIN DETEMIR 100 UNIT/ML ~~LOC~~ SOLN: 30.0000 [IU] | Freq: Every day | SUBCUTANEOUS | 11 refills | Status: DC

## 2017-06-10 NOTE — Progress Notes (Addendum)
[facility] [place], [place] [address]  Internal MEDICINE  Office Visit Note  Patient Name: Chelsea Rivera  [id number]  [id number]  Date of Service: 06/10/2017  No chief complaint on file.   Diabetes  She presents for her follow-up diabetic visit. She has type 2 diabetes mellitus. No MedicAlert identification noted. Her disease course has been worsening. Hypoglycemia symptoms include sleepiness and sweats. Pertinent negatives for hypoglycemia include no headaches, nervousness/anxiousness or tremors. Associated symptoms include fatigue. Pertinent negatives for diabetes include no chest pain. Symptoms are worsening. There are no diabetic complications. Risk factors for coronary artery disease include dyslipidemia, hypertension and stress. Current diabetic treatment includes insulin injections and oral agent (dual therapy). She is following a generally healthy diet. Meal planning includes ADA exchanges. She has not had a previous visit with a dietitian. She participates in exercise intermittently. Her home blood glucose trend is increasing steadily. An ACE inhibitor/angiotensin II receptor blocker is being taken. She does not see a podiatrist.Eye exam is current.  Abdominal Pain  This is a chronic problem. The current episode started more than 1 month ago. The onset quality is gradual. The problem occurs constantly. The pain is located in the epigastric region. The pain is moderate. The quality of the pain is colicky. The abdominal pain radiates to the epigastric region. Associated symptoms include belching, constipation, diarrhea and flatus. Pertinent negatives include no arthralgias, dysuria, frequency, headaches, nausea or vomiting. Nothing aggravates the pain. The pain is relieved by nothing. She has tried antibiotics for the symptoms. The treatment provided moderate relief. Prior diagnostic workup includes GI consult. Her past medical history is significant for abdominal  surgery.    Pt is here for routine follow up.    Current Medication: Outpatient Encounter Medications as of 06/10/2017  Medication Sig Note  . allopurinol (ZYLOPRIM) 100 MG tablet Take by mouth. 05/14/2016: Received from: [place]: Take 100 mg by mouth.  Marland Kitchen aspirin EC 81 MG tablet Take 81 mg by mouth daily.   Marland Kitchen atorvastatin (LIPITOR) 10 MG tablet Take 1/2 tab po  Daily for high cholesterol   . bisoprolol (ZEBETA) 5 MG tablet Take by mouth. 05/14/2016: Received from: [place]: Take 5 mg by mouth.  . conjugated estrogens (PREMARIN) vaginal cream Place vaginally. 05/14/2016: Received from: [place]: Place vaginally.  . Dulaglutide 1.5 MG/0.5ML SOPN Inject into the skin. 05/14/2016: Received from: [place]: Inject 1.5 mg subcutaneously every 7 (seven) days.  . Insulin Glargine (LANTUS) 100 UNIT/ML Solostar Pen Inject into the skin. 05/14/2016: Received from: [place]: Inject 30 Units subcutaneously nightly.  Marland Kitchen LINZESS 145 MCG CAPS capsule Take 1 capsule (145 mcg total) by mouth daily.   Marland Kitchen lisinopril (PRINIVIL,ZESTRIL) 2.5 MG tablet Take by mouth. 05/14/2016: Received from: [place]: Take 2.5 mg by mouth once daily.  . metFORMIN (GLUCOPHAGE-XR) 750 MG 24 hr tablet Take by mouth. 05/14/2016: Received from: [place]: Take 750 mg by mouth 2 (two) times daily.  Marland Kitchen omeprazole (PRILOSEC) 40 MG capsule Take by mouth. 05/14/2016: Received from: [place]: Take 1 capsule (40 mg total) by mouth once daily. One hour before a meal.  . ONE TOUCH ULTRA TEST test strip    . zolpidem (AMBIEN) 10 MG tablet Take 1 tablet (10 mg total) by mouth at bedtime as  needed for sleep.   . ciprofloxacin (CIPRO) 250 MG tablet Take 1 tablet (250 mg total) by mouth 2 (two) times  daily. (Patient not taking: Reported on 06/10/2017)   . furosemide (LASIX) 20 MG tablet Take 1 tablet (20 mg total) by mouth daily as needed. (Patient not taking: Reported on 06/10/2017)   . [DISCONTINUED] aspirin 81 MG chewable tablet Chew by mouth. 05/14/2016: Received from: [place]: Take 81 mg by mouth.   No facility-administered encounter medications on file as of 06/10/2017.     Surgical History: Past Surgical History:  Procedure Laterality Date  . BREAST BIOPSY Right 2012   cleaned out fistula, not a biopsy    Medical History: Past Medical History:  Diagnosis Date  . Diabetes mellitus without complication ([place])   . GERD (gastroesophageal reflux disease)   . Hyperlipidemia   . Hypertension     Family History: Family History  Problem Relation Age of Onset  . Lung cancer Mother   . Colon cancer Father   . Breast cancer Neg Hx     Social History   Socioeconomic History  . Marital status: Married    Spouse name: Not on file  . Number of children: Not on file  . Years of education: Not on file  . Highest education level: Not on file  Social Needs  . Financial resource strain: Not on file  . Food insecurity - worry: Not on file  . Food insecurity - inability: Not on file  . Transportation needs - medical: Not on file  . Transportation needs - non-medical: Not on file  Occupational History  . Not on file  Tobacco Use  . Smoking status: Never Smoker  . Smokeless tobacco: Never Used  Substance and Sexual Activity  . Alcohol use: Yes    Frequency: Never    Comment: social  . Drug use: No  . Sexual activity: Not on file  Other Topics Concern  . Not on file  Social History Narrative  . Not on file      Review of Systems  Constitutional: Positive for fatigue. Negative for activity change, chills and unexpected weight change.  HENT: Negative for congestion, postnasal drip, rhinorrhea, sneezing and sore throat.   Eyes: Negative for  discharge and redness.  Respiratory: Negative for cough, chest tightness and shortness of breath.   Cardiovascular: Negative for chest pain and palpitations.  Gastrointestinal: Positive for abdominal pain, constipation, diarrhea and flatus. Negative for nausea and vomiting.  Endocrine:       Overall, Blood sugars doing well   Genitourinary: Negative for dysuria and frequency.  Musculoskeletal: Negative for arthralgias, back pain, joint swelling and neck pain.  Skin: Negative for rash.  Neurological: Negative for tremors, numbness and headaches.  Hematological: Negative for adenopathy. Does not bruise/bleed easily.  Psychiatric/Behavioral: Negative for behavioral problems (Depression), sleep disturbance and suicidal ideas. The patient is not nervous/anxious.     Today's Vitals   06/10/17 0933  BP: 140/80  Pulse: 78  Resp: 16  SpO2: 98%  Weight: 176 lb (79.8 kg)  Height: 5\' 3"  (1.6 m)    Physical Exam  Constitutional: She is oriented to person, place, and time. She appears well-developed and well-nourished. No distress.  HENT:  Head: Normocephalic and atraumatic.  Mouth/Throat: Oropharynx is clear and moist. No oropharyngeal exudate.  Eyes: EOM are normal. Pupils are equal, round, and reactive to light.  Neck: Normal range of motion. Neck supple. No JVD present. No tracheal  deviation present. No thyromegaly present.  Cardiovascular: Normal rate, regular rhythm and normal heart sounds. Exam reveals no gallop and no friction rub.  No murmur heard. Pulmonary/Chest: Effort normal and breath sounds normal. No respiratory distress. She has no wheezes. She has no rales. She exhibits no tenderness. Right breast exhibits no inverted nipple, no mass, no nipple discharge, no skin change and no tenderness. Left breast exhibits no inverted nipple, no mass, no nipple discharge, no skin change and no tenderness.  Abdominal: Soft. Bowel sounds are normal. There is tenderness.  Musculoskeletal:  Normal range of motion.  Lymphadenopathy:    She has no cervical adenopathy.  Neurological: She is alert and oriented to person, place, and time. No cranial nerve deficit.  Skin: Skin is warm and dry. She is not diaphoretic.  Psychiatric: She has a normal mood and affect. Her behavior is normal. Judgment and thought content normal.  Nursing note and vitals reviewed.  Assessment/Plan: 1. Encounter for general adult medical examination with abnormal findings Annual health maintenance exam today  2. Uncontrolled type 2 diabetes mellitus with hyperglycemia (HCC) - POCT HgB A1C 8.2 today. No changes made in diabetic medications today. Did revview ADA diet and importance of decreasing carbs and sugars and increasing protein.  3. Small bowel disease - rifaximin (XIFAXAN) 550 MG TABS tablet; Take 1 tablet (550 mg total) by mouth 2 (two) times daily.  Dispense: 42 tablet; Refill: 0  4. Benign hypertension Stable BP. Continue bp mediicatioins as prescribed   5. Mixed hyperlipidemia Cholesterol panel stable. contineu statin as prescribed.   General Counseling: zaliah wissner understanding of the findings of todays visit and agrees with plan of treatment. I have discussed any further diagnostic evaluation that may be needed or ordered today. We also reviewed her medications today. she has been encouraged to call the office with any questions or concerns that should arise related to todays visit.   Diabetes Counseling:  1. Addition of ACE inh/ ARB'S for nephroprotection. 2. Diabetic foot care, prevention of complications.  3.Exercise and lose weight.  4. Diabetic eye examination, 5. Monitor blood sugar closlely. nutrition counseling.  6.Sign and symptoms of hypoglycemia including shaking sweating,confusion and headaches.   This patient was seen by Leretha Pol, FNP- C in Collaboration with Dr Lavera Guise as a part of collaborative care agreement  Orders Placed This Encounter   Procedures  . POCT HgB A1C      Time spent: 30 Minutes   Dr Lavera Guise Internal medicine

## 2017-06-19 DIAGNOSIS — E1165 Type 2 diabetes mellitus with hyperglycemia: Secondary | ICD-10-CM | POA: Insufficient documentation

## 2017-06-19 DIAGNOSIS — I10 Essential (primary) hypertension: Secondary | ICD-10-CM | POA: Insufficient documentation

## 2017-06-19 DIAGNOSIS — I152 Hypertension secondary to endocrine disorders: Secondary | ICD-10-CM | POA: Insufficient documentation

## 2017-06-19 DIAGNOSIS — E782 Mixed hyperlipidemia: Secondary | ICD-10-CM | POA: Insufficient documentation

## 2017-06-19 DIAGNOSIS — K639 Disease of intestine, unspecified: Secondary | ICD-10-CM | POA: Insufficient documentation

## 2017-06-30 ENCOUNTER — Other Ambulatory Visit: Payer: Self-pay

## 2017-06-30 MED ORDER — OMEPRAZOLE 40 MG PO CPDR: 40.0000 mg | DELAYED_RELEASE_CAPSULE | Freq: Every day | ORAL | 5 refills | Status: DC

## 2017-07-04 ENCOUNTER — Other Ambulatory Visit: Payer: Self-pay

## 2017-07-05 ENCOUNTER — Other Ambulatory Visit: Payer: Self-pay

## 2017-07-05 MED ORDER — ONDANSETRON HCL 8 MG PO TABS: ORAL_TABLET | ORAL | 2 refills | Status: DC

## 2017-07-28 ENCOUNTER — Other Ambulatory Visit: Payer: Self-pay

## 2017-07-28 MED ORDER — LISINOPRIL 2.5 MG PO TABS: ORAL_TABLET | ORAL | 5 refills | Status: DC

## 2017-08-26 ENCOUNTER — Other Ambulatory Visit: Payer: Self-pay | Admitting: Internal Medicine

## 2017-08-29 ENCOUNTER — Other Ambulatory Visit: Payer: Self-pay

## 2017-08-29 MED ORDER — ATORVASTATIN CALCIUM 10 MG PO TABS: ORAL_TABLET | ORAL | 5 refills | Status: DC

## 2017-09-12 ENCOUNTER — Ambulatory Visit: Payer: Self-pay | Admitting: Nurse Practitioner

## 2017-09-26 ENCOUNTER — Inpatient Hospital Stay: Admit: 2017-09-26 | Attending: Surgery | Primary: Internal Medicine

## 2017-09-26 ENCOUNTER — Encounter: Primary: Internal Medicine

## 2017-09-26 NOTE — Other (Unsigned)
Patient Acct Nbr: [id number]   Primary AUTH/CERT:   Primary Insurance Company Name: [employer]  Primary Insurance Plan name: [facility]  Primary Insurance Group Number:   Primary Insurance Plan Type: Health  Primary Insurance Policy Number: [id number]

## 2017-10-05 ENCOUNTER — Ambulatory Visit: Admit: 2017-10-05 | Discharge: 2017-10-05 | Payer: MEDICAID | Attending: Surgery | Primary: Internal Medicine

## 2017-10-05 DIAGNOSIS — C50912 Malignant neoplasm of unspecified site of left female breast: Secondary | ICD-10-CM

## 2017-10-05 NOTE — Progress Notes (Signed)
Subjective:     Patient: Kelly Miles is a 57 y.o. 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  Patient underwent a lumpectomy with sentinel lymph node biopsy and radiation therapy.  Her Oncotype score was low and did not have chemotherapy.  Since I saw her last, she is developed right rotator cuff injury-she is seeing a shoulder specialist and she is right-hand dominant.  Patient has fibromyalgia was also diagnosed with collagenous colitis since her last visit by colonoscopy and is followed by gastroenterology.  Patient's mammogram and May 2019 showed no change and no sign of malignancy.  Patient denies nipple discharge, change in self exam, skin change or mastitis on either side.  Patient denies systemic symptoms and has had no bony type pain other than her baseline fibromyalgia, no jaundice and no weight loss    Review of Systems   Constitutional: Negative for appetite change and fatigue.   HENT: Negative for voice change.    Respiratory: Negative for cough and chest tightness.    Gastrointestinal: Negative for abdominal distention, abdominal pain, constipation, diarrhea, nausea and vomiting.   Endocrine: Negative for heat intolerance.   Genitourinary: Negative for dysuria, frequency and vaginal bleeding.   Musculoskeletal: Negative for arthralgias and myalgias.   Neurological: Negative for weakness and headaches.   Hematological: Negative for adenopathy.   Psychiatric/Behavioral: Negative for suicidal ideas. The patient is not nervous/anxious.         Allergies   Allergen Reactions   ??? Ciprofloxacin Itching   ??? Influenza Vaccines Nausea And Vomiting        Current Outpatient Medications on File Prior to Visit   Medication Sig Dispense Refill   ??? hydrochlorothiazide (HYDRODIURIL) 25 MG tablet Take 25  mg by mouth daily     ??? budesonide (ENTOCORT EC) 3 MG extended release capsule Take 6 mg by mouth every morning     ??? cholestyramine (QUESTRAN) 4 g packet Take 1 packet by mouth 3 times daily (with meals)     ??? Menthol, Topical Analgesic, (BIOFREEZE COLORLESS EX) Apply topically     ??? tiZANidine (ZANAFLEX) 4 MG tablet      ??? Multiple Vitamins-Iron (TAB-A-VITE/IRON) TABS      ??? amitriptyline (ELAVIL) 10 MG tablet      ??? SYNTHROID 125 MCG tablet Take 112 mcg by mouth daily      ??? losartan (COZAAR) 100 MG tablet Take 100 mg by mouth daily      ??? cetirizine (ZYRTEC) 10 MG tablet Take 10 mg by mouth daily      ??? vitamin D (CHOLECALCIFEROL) 1000 UNIT TABS tablet Take 2,000 Units by mouth daily      ??? gabapentin (NEURONTIN) 300 MG capsule Take 300 mg by mouth.Marland Kitchen     ??? tamoxifen (NOLVADEX) 20 MG tablet TAKE ONE TABLET BY MOUTH ONCE DAILY 90 tablet 3   ??? Multiple Vitamins-Minerals (THERAPEUTIC MULTIVITAMIN-MINERALS) tablet Take 1 tablet by mouth daily     ??? ibuprofen (ADVIL;MOTRIN) 800 MG tablet Take 800 mg by mouth 3 times daily   2   ??? cyclobenzaprine (FLEXERIL) 10 MG tablet Take 10 mg by mouth 3 times daily as needed   5     No current facility-administered medications on file prior to visit.  Past Medical History:   Diagnosis Date   ??? Allergic rhinitis    ??? Cancer ([place])    ??? Chronic back pain    ??? Colitis    ??? Fibromyalgia    ??? Hypertension    ??? Hypothyroidism    ??? Irritable bowel syndrome    ??? Neuropathy    ??? Osteoarthritis     back   ??? Restless legs syndrome       Past Surgical History:   Procedure Laterality Date   ??? BREAST SURGERY     ??? CHOLECYSTECTOMY     ??? COLONOSCOPY     ??? INNER EAR SURGERY      both sides   ??? TONSILLECTOMY AND ADENOIDECTOMY       Family History   Problem Relation Age of Onset   ??? Cancer Brother    ??? Other Brother         allergies   ??? High Blood Pressure Mother    ??? Stroke Mother    ??? Heart Disease Father    ??? High Blood Pressure Sister    ??? Other Sister         allergies     Social History      Tobacco Use   ??? Smoking status: Former Smoker   ??? Smokeless tobacco: Never Used   Substance Use Topics   ??? Alcohol use: No          Objective:     BP (!) 158/95    Ht 5' 6"  (1.676 m)    Wt 174 lb 9.6 oz (79.2 kg)    BMI 28.18 kg/m??     Physical Exam   Pulmonary/Chest:   Patient has chronic retraction at the left lumpectomy site no change    The left breast is surgically smaller than the right    There are no palpable masses in either breast or axilla    There is no nipple discharge in either side    There are no cervical masses           Assessment      1. Malignant neoplasm of left breast in female, estrogen receptor positive, unspecified site of breast ([place])         Plan      1. Malignant neoplasm of left breast in female, estrogen receptor positive, unspecified site of breast ([place])  No sign of local or distant recurrence; patient is off tamoxifen-she had 5 years of tamoxifen but because of her fibromyalgia decided to stop upon consultation with her oncologist Dr. Lyda Kalata.  She will continue her self exams and I will see her after her next mammogram in [place], [place]  10/05/17  9:46 AM      Health Maintenance Due   Topic Date Due   ??? Potassium monitoring  04-21-1959   ??? Creatinine monitoring  [birth year]   ??? Hepatitis C screen  May 13, 1960   ??? HIV screen  09/16/1973   ??? DTaP/Tdap/Td vaccine (1 - Tdap) 09/16/1977   ??? Cervical cancer screen  09/17/1979   ??? Lipid screen  09/17/1998   ??? Diabetes screen  09/17/1998   ??? Shingles Vaccine (1 of 2) 09/16/2008   ??? Colon cancer screen colonoscopy  09/16/2008

## 2017-10-27 ENCOUNTER — Other Ambulatory Visit: Payer: Self-pay

## 2017-10-27 ENCOUNTER — Encounter: Payer: Self-pay | Admitting: Nurse Practitioner

## 2017-10-27 MED ORDER — LINZESS 145 MCG PO CAPS: 145.0000 ug | ORAL_CAPSULE | Freq: Every day | ORAL | 3 refills | Status: DC

## 2017-11-11 ENCOUNTER — Other Ambulatory Visit: Payer: Self-pay

## 2017-11-11 DIAGNOSIS — F5101 Primary insomnia: Secondary | ICD-10-CM

## 2017-11-11 MED ORDER — ZOLPIDEM TARTRATE 10 MG PO TABS: 10.0000 mg | ORAL_TABLET | Freq: Every evening | ORAL | 3 refills | Status: DC | PRN

## 2017-11-18 ENCOUNTER — Ambulatory Visit (INDEPENDENT_AMBULATORY_CARE_PROVIDER_SITE_OTHER): Payer: Managed Care, Other (non HMO) | Admitting: Nurse Practitioner

## 2017-11-18 ENCOUNTER — Encounter: Payer: Self-pay | Admitting: Nurse Practitioner

## 2017-11-18 VITALS — BP 140/62 | HR 77 | Resp 16 | Ht 64.0 in | Wt 171.6 lb

## 2017-11-18 DIAGNOSIS — Z1239 Encounter for other screening for malignant neoplasm of breast: Secondary | ICD-10-CM

## 2017-11-18 DIAGNOSIS — I10 Essential (primary) hypertension: Secondary | ICD-10-CM | POA: Diagnosis not present

## 2017-11-18 DIAGNOSIS — K639 Disease of intestine, unspecified: Secondary | ICD-10-CM | POA: Diagnosis not present

## 2017-11-18 DIAGNOSIS — E1165 Type 2 diabetes mellitus with hyperglycemia: Secondary | ICD-10-CM

## 2017-11-18 DIAGNOSIS — Z1231 Encounter for screening mammogram for malignant neoplasm of breast: Secondary | ICD-10-CM | POA: Diagnosis not present

## 2017-11-18 LAB — POCT GLYCOSYLATED HEMOGLOBIN (HGB A1C): Hemoglobin A1C: 9.2 % — AB (ref 4.0–5.6)

## 2017-11-18 NOTE — Progress Notes (Signed)
[facility] [place], [place] [address]  Internal MEDICINE  Office Visit Note  Patient Name: Chelsea Rivera  [id number]  [id number]  Date of Service: 12/14/2017   Pt is here for routine follow up.    Chief Complaint  Patient presents with  . Diabetes    3 month follow up  . Medication Management    pt on new insurance wants to switch back to lantus    Diabetes  She presents for her follow-up diabetic visit. She has type 2 diabetes mellitus. No MedicAlert identification noted. Her disease course has been worsening. There are no hypoglycemic associated symptoms. Pertinent negatives for hypoglycemia include no headaches, nervousness/anxiousness or tremors. There are no diabetic associated symptoms. Pertinent negatives for diabetes include no chest pain. There are no hypoglycemic complications. Symptoms are worsening. There are no diabetic complications. Risk factors for coronary artery disease include diabetes mellitus, dyslipidemia and hypertension. Current diabetic treatment includes insulin injections and oral agent (dual therapy). She is compliant with treatment most of the time. Her weight is stable. She is following a generally healthy diet. Meal planning includes ADA exchanges. She has not had a previous visit with a dietitian. She participates in exercise three times a week. There is no change in her home blood glucose trend. An ACE inhibitor/angiotensin II receptor blocker is being taken. She does not see a podiatrist.Eye exam is current.       Current Medication: Outpatient Encounter Medications as of 11/18/2017  Medication Sig Note  . allopurinol (ZYLOPRIM) 100 MG tablet Take by mouth. 05/14/2016: Received from: [place]: Take 100 mg by mouth.  Marland Kitchen aspirin EC 81 MG tablet Take 81 mg by mouth daily.   Marland Kitchen atorvastatin (LIPITOR) 10 MG tablet Take 1/2 tab po  Daily for high cholesterol   . bisoprolol (ZEBETA) 5 MG tablet TAKE 1  TABLET BY MOUTH AT BEDTIME   . conjugated estrogens (PREMARIN) vaginal cream Place vaginally. 05/14/2016: Received from: [place]: Place vaginally.  . furosemide (LASIX) 20 MG tablet Take 1 tablet (20 mg total) by mouth daily as needed.   . Insulin Detemir (LEVEMIR FLEXTOUCH) 100 UNIT/ML Pen 30 units at bedtime   . LINZESS 145 MCG CAPS capsule Take 1 capsule (145 mcg total) by mouth daily.   Marland Kitchen lisinopril (PRINIVIL,ZESTRIL) 2.5 MG tablet TAKE ONE TAB EVERY DAY   . metFORMIN (GLUCOPHAGE-XR) 750 MG 24 hr tablet Take by mouth. 05/14/2016: Received from: [place]: Take 750 mg by mouth 2 (two) times daily.  Marland Kitchen omeprazole (PRILOSEC) 40 MG capsule Take 1 capsule (40 mg total) by mouth daily.   . ondansetron (ZOFRAN) 8 MG tablet Take 1 tab po twice a daily as needed for nausea   . ONE TOUCH ULTRA TEST test strip    . rifaximin (XIFAXAN) 550 MG TABS tablet Take 1 tablet (550 mg total) by mouth 2 (two) times daily.   Marland Kitchen zolpidem (AMBIEN) 10 MG tablet Take 1 tablet (10 mg total) by mouth at bedtime as needed for sleep.   . [DISCONTINUED] Dulaglutide 1.5 MG/0.5ML SOPN Inject into the skin. 05/14/2016: Received from: [place]: Inject 1.5 mg subcutaneously every 7 (seven) days.  . ciprofloxacin (CIPRO) 250 MG tablet Take 1 tablet (250 mg total) by mouth 2 (two) times daily. (Patient not taking: Reported on 06/10/2017)    No facility-administered encounter medications on file as of 11/18/2017.  Surgical History: Past Surgical History:  Procedure Laterality Date  . BREAST BIOPSY Right 2012   cleaned out fistula, not a biopsy    Medical History: Past Medical History:  Diagnosis Date  . Diabetes mellitus without complication ([place])   . GERD (gastroesophageal reflux disease)   . Hyperlipidemia   . Hypertension     Family History: Family History  Problem Relation Age of Onset  . Lung cancer Mother   . Colon  cancer Father   . Breast cancer Neg Hx     Social History   Socioeconomic History  . Marital status: Married    Spouse name: Not on file  . Number of children: Not on file  . Years of education: Not on file  . Highest education level: Not on file  Occupational History  . Not on file  Social Needs  . Financial resource strain: Not on file  . Food insecurity:    Worry: Not on file    Inability: Not on file  . Transportation needs:    Medical: Not on file    Non-medical: Not on file  Tobacco Use  . Smoking status: Never Smoker  . Smokeless tobacco: Never Used  Substance and Sexual Activity  . Alcohol use: Yes    Frequency: Never    Comment: social  . Drug use: No  . Sexual activity: Not on file  Lifestyle  . Physical activity:    Days per week: Not on file    Minutes per session: Not on file  . Stress: Not on file  Relationships  . Social connections:    Talks on phone: Not on file    Gets together: Not on file    Attends religious service: Not on file    Active member of club or organization: Not on file    Attends meetings of clubs or organizations: Not on file    Relationship status: Not on file  . Intimate partner violence:    Fear of current or ex partner: Not on file    Emotionally abused: Not on file    Physically abused: Not on file    Forced sexual activity: Not on file  Other Topics Concern  . Not on file  Social History Narrative  . Not on file      Review of Systems  Constitutional: Negative for activity change, chills and unexpected weight change.  HENT: Negative for congestion, postnasal drip, rhinorrhea, sneezing and sore throat.   Eyes: Negative for discharge and redness.  Respiratory: Negative for cough, chest tightness and shortness of breath.   Cardiovascular: Negative for chest pain and palpitations.  Gastrointestinal: Positive for abdominal pain, constipation and diarrhea. Negative for nausea and vomiting.  Endocrine:       Blood  sugars elevated recently. Admits she went out of town and forgot insulin and testing kit with her.   Genitourinary: Negative for dysuria and frequency.  Musculoskeletal: Negative for arthralgias, back pain, joint swelling and neck pain.  Skin: Negative for rash.  Neurological: Negative for tremors, numbness and headaches.  Hematological: Negative for adenopathy. Does not bruise/bleed easily.  Psychiatric/Behavioral: Negative for behavioral problems (Depression), sleep disturbance and suicidal ideas. The patient is not nervous/anxious.     Today's Vitals   11/18/17 0959  BP: 140/62  Pulse: 77  Resp: 16  SpO2: 97%  Weight: 171 lb 9.6 oz (77.8 kg)  Height: 5' 4"  (1.626 m)    Physical Exam  Constitutional: She is oriented to person, place,  and time. She appears well-developed and well-nourished. No distress.  HENT:  Head: Normocephalic and atraumatic.  Nose: Nose normal.  Mouth/Throat: Oropharynx is clear and moist. No oropharyngeal exudate.  Eyes: Pupils are equal, round, and reactive to light. Conjunctivae and EOM are normal.  Neck: Normal range of motion. Neck supple. No JVD present. No tracheal deviation present. No thyromegaly present.  Cardiovascular: Normal rate, regular rhythm and normal heart sounds. Exam reveals no gallop and no friction rub.  No murmur heard. Pulmonary/Chest: Effort normal and breath sounds normal. No respiratory distress. She has no wheezes. She has no rales. She exhibits no tenderness. Right breast exhibits no inverted nipple, no mass, no nipple discharge, no skin change and no tenderness. Left breast exhibits no inverted nipple, no mass, no nipple discharge, no skin change and no tenderness.  Abdominal: Soft. Bowel sounds are normal. There is tenderness.  Musculoskeletal: Normal range of motion.  Lymphadenopathy:    She has no cervical adenopathy.  Neurological: She is alert and oriented to person, place, and time. No cranial nerve deficit.  Skin: Skin is  warm and dry. She is not diaphoretic.  Psychiatric: She has a normal mood and affect. Her behavior is normal. Judgment and thought content normal.  Nursing note and vitals reviewed.  Assessment/Plan: 1. Uncontrolled type 2 diabetes mellitus with hyperglycemia (HCC) - POCT HgB A1C 9.2 today. Likely elevated as she has been off insulin for some time. Has restarted her meds as prescribed and has noted blood sugars coming back down. No DM med changes today, but reviewed importance of taking all medications as prescribed .  2. Benign hypertension Stable.   3. Small bowel disease Stable.   4. Screening for breast cancer - MM 3D SCREEN BREAST BILATERAL; Future  General Counseling: marilou barnfield understanding of the findings of todays visit and agrees with plan of treatment. I have discussed any further diagnostic evaluation that may be needed or ordered today. We also reviewed her medications today. she has been encouraged to call the office with any questions or concerns that should arise related to todays visit.    Counseling:  Diabetes Counseling:  1. Addition of ACE inh/ ARB'S for nephroprotection. Microalbumin is updated  2. Diabetic foot care, prevention of complications. Podiatry consult 3. Exercise and lose weight.  4. Diabetic eye examination, Diabetic eye exam is updated  5. Monitor blood sugar closlely. nutrition counseling.  6. Sign and symptoms of hypoglycemia including shaking sweating,confusion and headaches.   This patient was seen by Leretha Pol FNP Collaboration with Dr Lavera Guise as a part of collaborative care agreement  Orders Placed This Encounter  Procedures  . MM 3D SCREEN BREAST BILATERAL  . POCT HgB A1C      Time spent: 15 Minutes      Dr Lavera Guise Internal medicine

## 2017-11-25 ENCOUNTER — Other Ambulatory Visit: Payer: Self-pay | Admitting: Internal Medicine

## 2017-12-14 DIAGNOSIS — Z1211 Encounter for screening for malignant neoplasm of colon: Secondary | ICD-10-CM | POA: Insufficient documentation

## 2017-12-17 ENCOUNTER — Encounter: Payer: Self-pay | Admitting: Nurse Practitioner

## 2017-12-19 ENCOUNTER — Other Ambulatory Visit: Payer: Self-pay | Admitting: Nurse Practitioner

## 2017-12-19 DIAGNOSIS — R635 Abnormal weight gain: Secondary | ICD-10-CM

## 2017-12-19 MED ORDER — PHENTERMINE HCL 37.5 MG PO TABS
37.5000 mg | ORAL_TABLET | Freq: Every day | ORAL | 2 refills | Status: DC
Start: 2017-12-19 — End: 2018-04-03

## 2017-12-19 NOTE — Progress Notes (Signed)
Added phentermine 37.5mg  tablets daily. Put refills to last until next visit 03/2018. Will discuss in more detail at her next visit.

## 2017-12-28 ENCOUNTER — Other Ambulatory Visit: Payer: Self-pay

## 2017-12-28 MED ORDER — OMEPRAZOLE 40 MG PO CPDR: 40.0000 mg | DELAYED_RELEASE_CAPSULE | Freq: Every day | ORAL | 5 refills | Status: DC

## 2018-01-25 ENCOUNTER — Ambulatory Visit
Admit: 2018-01-25 | Discharge: 2018-01-25 | Payer: MEDICAID | Attending: Neurological Surgery | Primary: Internal Medicine

## 2018-01-25 DIAGNOSIS — G9389 Other specified disorders of brain: Secondary | ICD-10-CM

## 2018-01-25 NOTE — Progress Notes (Signed)
NEUROSURGERY CONSULT NOTE      Patient Name: Kelly Miles  Patient DOB: October 26, 1960  MRN: [id number]       PCP: Jeanett Schlein, MD      History of Present Ilness: 57 y.o. presents with HA's. HA's started 3 months ago, whole head. Also c/o double vision, blurry vision. Also pain in neck and shoulders and back of neck. Also numbness and weakness both arms 5-6 months.      Chief Complaint   Patient presents with   ??? New Patient     Cerebral Meningioma refer Dr. Anitra Lauth          Conservative Treatments:  Physical Therapy: Yes  NSAID's: Yes  Narcotics: No  Muscle relaxants: Yes    Past Medical History:        Diagnosis Date   ??? Allergic rhinitis    ??? Breast cancer ([place])    ??? Chronic back pain    ??? Colitis    ??? Fibromyalgia    ??? Hypertension    ??? Hypothyroidism    ??? Irritable bowel syndrome    ??? Neuropathy    ??? Osteoarthritis     back   ??? Restless legs syndrome        Past Surgical History:        Procedure Laterality Date   ??? BREAST SURGERY     ??? CHOLECYSTECTOMY     ??? COLONOSCOPY     ??? INNER EAR SURGERY      both sides   ??? TONSILLECTOMY AND ADENOIDECTOMY         Home Medications:   Prior to Admission medications    Medication Sig Start Date End Date Taking? Authorizing Provider   Doxylamine Succinate, Sleep, (SLEEP AID PO) Take by mouth   Yes Historical Provider, MD   irbesartan (AVAPRO) 300 MG tablet Take 300 mg by mouth nightly   Yes Historical Provider, MD   dexamethasone (DECADRON) 2 MG tablet Take 2 mg by mouth 2 times daily (with meals)   Yes Historical Provider, MD   aspirin 81 MG chewable tablet Take 81 mg by mouth daily   Yes Historical Provider, MD   Menthol, Topical Analgesic, (BIOFREEZE COLORLESS EX) Apply topically   Yes Historical Provider, MD   tiZANidine (ZANAFLEX) 4 MG tablet  03/11/17  Yes Historical Provider, MD   Multiple Vitamins-Iron (TAB-A-VITE/IRON) TABS  03/01/17  Yes Historical Provider, MD   amitriptyline (ELAVIL) 10 MG tablet  02/16/17  Yes Historical Provider, MD   tamoxifen (NOLVADEX) 20  MG tablet TAKE ONE TABLET BY MOUTH ONCE DAILY 09/24/16  Yes Sameer A Mahesh, MD   Multiple Vitamins-Minerals (THERAPEUTIC MULTIVITAMIN-MINERALS) tablet Take 1 tablet by mouth daily   Yes Historical Provider, MD   cyclobenzaprine (FLEXERIL) 10 MG tablet Take 10 mg by mouth 3 times daily as needed  08/07/14  Yes Historical Provider, MD   SYNTHROID 125 MCG tablet Take 112 mcg by mouth daily  06/24/14  Yes Historical Provider, MD   losartan (COZAAR) 100 MG tablet Take 100 mg by mouth daily  06/24/14  Yes Historical Provider, MD   cetirizine (ZYRTEC) 10 MG tablet Take 10 mg by mouth daily  06/24/14  Yes Historical Provider, MD   vitamin D (CHOLECALCIFEROL) 1000 UNIT TABS tablet Take 2,000 Units by mouth daily    Yes Historical Provider, MD   hydrochlorothiazide (HYDRODIURIL) 25 MG tablet Take 25 mg by mouth daily    Historical Provider, MD   budesonide (ENTOCORT EC)  3 MG extended release capsule Take 6 mg by mouth every morning    Historical Provider, MD   cholestyramine (QUESTRAN) 4 g packet Take 1 packet by mouth 3 times daily (with meals)    Historical Provider, MD   gabapentin (NEURONTIN) 300 MG capsule Take 300 mg by mouth.. 02/17/17 03/19/17  Historical Provider, MD   ibuprofen (ADVIL;MOTRIN) 800 MG tablet Take 800 mg by mouth 3 times daily  06/24/14   Historical Provider, MD           Allergies: Ciprofloxacin and Influenza vaccines    Social History:      TOBACCO:   reports that she quit smoking about 40 years ago. Her smoking use included cigarettes. She has never used smokeless tobacco.  ETOH:   reports that she drinks alcohol.  RECREATIONAL DRUG USE:   Social History     Substance and Sexual Activity   Drug Use No       Family History:           Problem Relation Age of Onset   ??? Other Brother         allergies   ??? Brain Cancer Brother    ??? High Blood Pressure Mother    ??? Stroke Mother    ??? Heart Disease Father    ??? High Blood Pressure Sister    ??? Other Sister         allergies   ??? Mult Sclerosis Sister               Review of Systems   Constitutional: Positive for activity change. Negative for unexpected weight change.   HENT: Negative for hearing loss and trouble swallowing.    Eyes: Negative for pain and visual disturbance.   Respiratory: Positive for shortness of breath. Negative for cough.    Cardiovascular: Positive for chest pain. Negative for leg swelling.   Gastrointestinal: Positive for abdominal pain. Negative for abdominal distention.   Genitourinary: Negative for difficulty urinating and urgency.   Musculoskeletal: Positive for back pain and neck pain.   Neurological: Negative for weakness and numbness.   Hematological: Negative for adenopathy. Does not bruise/bleed easily.   Psychiatric/Behavioral: Negative for behavioral problems and confusion.         Physical Examination:    Vitals:    01/25/18 1504   BP: (!) 152/100   Pulse: 92   Resp: 18       Physical Exam   Constitutional: She appears well-developed and well-nourished.   HENT:   Head: Normocephalic.   Neck: Normal range of motion. Neck supple.   Pulmonary/Chest: Effort normal. No respiratory distress.   Neurological: She is alert. She has normal strength. Coordination normal.   Reflex Scores:       Bicep reflexes are 2+ on the right side and 2+ on the left side.       Patellar reflexes are 2+ on the right side and 2+ on the left side.       Achilles reflexes are 2+ on the right side and 2+ on the left side.  Motor 5/5 UE and LE  Sensation intact LT  DTR's 2+ biceps, achilles and patella   Skin: Skin is warm, dry and intact.   Psychiatric: She has a normal mood and affect. Her speech is normal. Thought content normal.        Neurologic Exam     Mental Status   Speech: speech is normal     Motor Exam  Strength   Strength 5/5 throughout.     Gait, Coordination, and Reflexes     Reflexes   Right biceps: 2+  Left biceps: 2+  Right patellar: 2+  Left patellar: 2+  Right achilles: 2+  Left achilles: 2+      Gait  normal      Results  Labs:  Last  24hrs  No results found for this or any previous visit (from the past 24 hour(s)).    Radiology Personal review:   CT of head reveals an approximately 2.5 cm enhancing left frontal extra-axial mass    ASSESSMENT / PLAN :      This is a 57 year old with multiple complaints including headache, visual complaints as well as weakness and numbness in her arms, she also has history of breast cancer.  She has a left frontal extra-axial mass most consistent with meningioma although I explained to her that I cannot rule out metastasis given her history of breast cancer.  We need to obtain an magnetic resonance imaging of the brain with and without contrast to further evaluate this.  I'm also going to order a cervical magnetic resonance imaging with and without contrast given her weakness and numbness complaints in the arms and her history of cancer.   She will return to see me after the new imaging.  She has also been recommended to see ophthalmology which I agree with.      1. [facility])            Electronically signed by Clayborn Bigness, MD on 01/25/2018 at 3:36 PM

## 2018-01-27 NOTE — Telephone Encounter (Signed)
Patient called asking if she can go off her dexamethasone and go back on Budesonide. Her PCP told her not to take the Budesonide and start dexamethasone  but wanted your opinion.

## 2018-01-31 NOTE — Telephone Encounter (Signed)
She does not need steroids from my standpoint. Whatever her PCP wants.

## 2018-01-31 NOTE — Telephone Encounter (Signed)
Spoke with patient she verbalized understanding. 

## 2018-02-08 ENCOUNTER — Encounter: Attending: Neurological Surgery | Primary: Internal Medicine

## 2018-02-17 ENCOUNTER — Encounter

## 2018-02-18 LAB — CREATININE
Creatinine: 1 mg/dL (ref 0.52–1.25)
EGFR IF NonAfrican American: 56.7 mL/min (ref >60)
eGFR African American: >60 mL/min (ref >60)

## 2018-02-23 ENCOUNTER — Other Ambulatory Visit: Payer: Self-pay

## 2018-02-23 MED ORDER — LISINOPRIL 2.5 MG PO TABS: ORAL_TABLET | ORAL | 5 refills | Status: DC

## 2018-02-23 MED ORDER — BISOPROLOL FUMARATE 5 MG PO TABS: 5.0000 mg | ORAL_TABLET | Freq: Every day | ORAL | 5 refills | Status: DC

## 2018-02-23 MED ORDER — LINZESS 145 MCG PO CAPS: 145.0000 ug | ORAL_CAPSULE | Freq: Every day | ORAL | 3 refills | Status: DC

## 2018-02-23 MED ORDER — ATORVASTATIN CALCIUM 10 MG PO TABS: ORAL_TABLET | ORAL | 5 refills | Status: DC

## 2018-02-24 ENCOUNTER — Inpatient Hospital Stay: Admit: 2018-02-24 | Attending: Neurological Surgery | Primary: Internal Medicine

## 2018-02-24 ENCOUNTER — Ambulatory Visit: Primary: Internal Medicine

## 2018-02-24 DIAGNOSIS — C801 Malignant (primary) neoplasm, unspecified: Secondary | ICD-10-CM

## 2018-02-24 NOTE — Other (Unsigned)
Patient Acct Nbr: [id number]   Primary AUTH/CERT: [id number]  [place] Name: [place] name: [facility]  Primary Insurance Group Number: [id number]  Primary Insurance Plan Type: Health  Primary Insurance Policy Number: [id number]

## 2018-02-28 ENCOUNTER — Ambulatory Visit
Admit: 2018-02-28 | Discharge: 2018-02-28 | Payer: MEDICAID | Attending: Neurological Surgery | Primary: Internal Medicine

## 2018-02-28 DIAGNOSIS — D329 Benign neoplasm of meninges, unspecified: Secondary | ICD-10-CM

## 2018-02-28 NOTE — Patient Instructions (Signed)
If you have any questions regarding today's visit please call the office at [phone]

## 2018-02-28 NOTE — Progress Notes (Signed)
NEUROSURGERY and SPINE FOLLOW-UP NOTE      Patient Name: Kelly Miles  Patient DOB: 11-26-1960  MRN: [id number]       PCP: Jeanett Schlein, MD      History of Present Ilness: 57 y.o. presents with similar sx. HA's over whole head for last 6 months, worsening. Also tingling pain down arms and legs also, numbness and weakness. Also visual c/o. Sx go all the way down to the hands and feet.       Chief Complaint   Patient presents with   ??? Follow-up     F/U MRI            Past Medical History:        Diagnosis Date   ??? Allergic rhinitis    ??? Breast cancer ([place])    ??? Chronic back pain    ??? Colitis    ??? Fibromyalgia    ??? Hypertension    ??? Hypothyroidism    ??? Irritable bowel syndrome    ??? Neuropathy    ??? Osteoarthritis     back   ??? Restless legs syndrome        Past Surgical History:        Procedure Laterality Date   ??? BREAST SURGERY     ??? CHOLECYSTECTOMY     ??? COLONOSCOPY     ??? INNER EAR SURGERY      both sides   ??? TONSILLECTOMY AND ADENOIDECTOMY         Home Medications:   Prior to Admission medications    Medication Sig Start Date End Date Taking? Authorizing Provider   Doxylamine Succinate, Sleep, (SLEEP AID PO) Take by mouth   Yes Historical Provider, MD   irbesartan (AVAPRO) 300 MG tablet Take 300 mg by mouth nightly   Yes Historical Provider, MD   dexamethasone (DECADRON) 2 MG tablet Take 2 mg by mouth 2 times daily (with meals)   Yes Historical Provider, MD   aspirin 81 MG chewable tablet Take 81 mg by mouth daily   Yes Historical Provider, MD   hydrochlorothiazide (HYDRODIURIL) 25 MG tablet Take 25 mg by mouth daily   Yes Historical Provider, MD   budesonide (ENTOCORT EC) 3 MG extended release capsule Take 6 mg by mouth every morning   Yes Historical Provider, MD   cholestyramine (QUESTRAN) 4 g packet Take 1 packet by mouth 3 times daily (with meals)   Yes Historical Provider, MD   Menthol, Topical Analgesic, (BIOFREEZE COLORLESS EX) Apply topically   Yes Historical Provider, MD   tiZANidine (ZANAFLEX) 4 MG  tablet  03/11/17  Yes Historical Provider, MD   Multiple Vitamins-Iron (TAB-A-VITE/IRON) TABS  03/01/17  Yes Historical Provider, MD   amitriptyline (ELAVIL) 10 MG tablet  02/16/17  Yes Historical Provider, MD   tamoxifen (NOLVADEX) 20 MG tablet TAKE ONE TABLET BY MOUTH ONCE DAILY 09/24/16  Yes Sameer A Mahesh, MD   Multiple Vitamins-Minerals (THERAPEUTIC MULTIVITAMIN-MINERALS) tablet Take 1 tablet by mouth daily   Yes Historical Provider, MD   ibuprofen (ADVIL;MOTRIN) 800 MG tablet Take 800 mg by mouth 3 times daily  06/24/14  Yes Historical Provider, MD   cyclobenzaprine (FLEXERIL) 10 MG tablet Take 10 mg by mouth 3 times daily as needed  08/07/14  Yes Historical Provider, MD   SYNTHROID 125 MCG tablet Take 112 mcg by mouth daily  06/24/14  Yes Historical Provider, MD   losartan (COZAAR) 100 MG tablet Take 100 mg by mouth daily  06/24/14  Yes Historical Provider, MD   cetirizine (ZYRTEC) 10 MG tablet Take 10 mg by mouth daily  06/24/14  Yes Historical Provider, MD   vitamin D (CHOLECALCIFEROL) 1000 UNIT TABS tablet Take 2,000 Units by mouth daily    Yes Historical Provider, MD   gabapentin (NEURONTIN) 300 MG capsule Take 300 mg by mouth.. 02/17/17 03/19/17  Historical Provider, MD           Allergies: Ciprofloxacin and Influenza vaccines    Social History:      TOBACCO:   reports that she quit smoking about 40 years ago. Her smoking use included cigarettes. She has never used smokeless tobacco.  ETOH:   reports that she drinks alcohol.  RECREATIONAL DRUG USE:   Social History     Substance and Sexual Activity   Drug Use No       Family History:           Problem Relation Age of Onset   ??? Other Brother         allergies   ??? Brain Cancer Brother    ??? High Blood Pressure Mother    ??? Stroke Mother    ??? Heart Disease Father    ??? High Blood Pressure Sister    ??? Other Sister         allergies   ??? Mult Sclerosis Sister              Review of Systems:    Review of Systems   Constitutional: Negative for activity change and  unexpected weight change.   HENT: Negative for hearing loss and trouble swallowing.    Eyes: Positive for visual disturbance. Negative for pain.   Respiratory: Negative for cough and shortness of breath.    Cardiovascular: Negative for chest pain and leg swelling.   Gastrointestinal: Negative for abdominal distention and abdominal pain.   Genitourinary: Negative for difficulty urinating and urgency.   Musculoskeletal: Negative for back pain and neck pain.   Neurological: Positive for weakness and numbness. Negative for seizures.   Hematological: Negative for adenopathy. Does not bruise/bleed easily.   Psychiatric/Behavioral: Negative for behavioral problems and confusion.         Physical Examination:    Vitals:    02/28/18 1014   BP: 110/78   Pulse: 82   Resp: 18       Physical Exam   Constitutional: She is oriented to person, place, and time. She appears well-developed and well-nourished.   HENT:   Head: Normocephalic.   Eyes: Conjunctivae are normal.   Pupils equal     Neck: Normal range of motion. Neck supple.   Cardiovascular: Intact distal pulses.   No peripheral edema     Pulmonary/Chest: Effort normal. No respiratory distress. She exhibits tenderness.   Has intermittent chest pain and racing heart-Stress test was negative   Abdominal: Soft. There is no tenderness.   Neurological: She is alert and oriented to person, place, and time. She has normal strength. Coordination normal.   Reflex Scores:       Bicep reflexes are 2+ on the right side and 2+ on the left side.       Patellar reflexes are 2+ on the right side and 2+ on the left side.       Achilles reflexes are 2+ on the right side and 2+ on the left side.  Naming and repetition intact  Motor 5/5 UE and LE  Sensation intact LT  DTR's 2+  biceps, achilles and patella   Skin: Skin is warm, dry and intact.   Psychiatric: She has a normal mood and affect. Her speech is normal. Thought content normal.          Gait  normal      Results  Labs:  Last 24hrs  No  results found for this or any previous visit (from the past 24 hour(s)).    Radiology Personal review:  MRI brain w/w/o reveals left frontal homogenously enhancing extra-axial mass 2.5 cm  MRI cervical reveals moderate bilateral foraminal stenosis at C5-6 and right-sided at C6-7    ASSESSMENT / PLAN :     This a 57 year old with multiple complaints including headache as well as vision complaints and numbness tingling and weakness in her arms and legs.  She tells me that the headaches started 6 months ago and have been worsening.  She has magnetic resonance imaging cervical spine which does reveal some moderate foraminal stenosis from C5-C7.  Her magnetic resonance imaging of the brain reveals a left parasagittal homogeneously enhancing dural-based mass which is most consistent with meningioma.  We have discussed options including serial observation versus radiation versus surgery.  I have discussed the risks, benefits, alternatives, complications and personnel of left  craniotomy for resection of tumor using Stealth navigation and she wishes to think about it.  She will let me know if she decides to proceed with surgery.  She understands that surgery will not likely result all of her symptoms but may help with the headaches.  She also may require cervical surgery down the line for her cervical stenosis.      There are no diagnoses linked to this encounter.          Electronically signed by Clayborn Bigness, MD on 02/28/2018 at 10:31 AM

## 2018-03-09 ENCOUNTER — Ambulatory Visit
Admit: 2018-03-09 | Discharge: 2018-03-09 | Payer: MEDICAID | Attending: Neurological Surgery | Primary: Internal Medicine

## 2018-03-09 DIAGNOSIS — D329 Benign neoplasm of meninges, unspecified: Secondary | ICD-10-CM

## 2018-03-09 NOTE — Progress Notes (Signed)
NEUROSURGERY and SPINE FOLLOW-UP NOTE      Patient Name: Kelly Miles  Patient DOB: [birth year]  MRN: [id number]       PCP: Jeanett Schlein, MD      History of Present Ilness: 57 y.o. presents with f/u for questions about surgery. Continues having daily HA's. Generalized weakness for 6 months. No seizures.  On further questioning she feels that the right arm is weaker than the left and has been for several months as well.    Chief Complaint   Patient presents with   ??? Follow-up     Brain mass          Past Medical History:        Diagnosis Date   ??? Allergic rhinitis    ??? Breast cancer ([place])    ??? Chronic back pain    ??? Colitis    ??? Fibromyalgia    ??? Hypertension    ??? Hypothyroidism    ??? Irritable bowel syndrome    ??? Neuropathy    ??? Osteoarthritis     back   ??? Restless legs syndrome        Past Surgical History:        Procedure Laterality Date   ??? BREAST SURGERY     ??? CHOLECYSTECTOMY     ??? COLONOSCOPY     ??? INNER EAR SURGERY      both sides   ??? TONSILLECTOMY AND ADENOIDECTOMY         Home Medications:   Prior to Admission medications    Medication Sig Start Date End Date Taking? Authorizing Provider   Doxylamine Succinate, Sleep, (SLEEP AID PO) Take by mouth   Yes Historical Provider, MD   irbesartan (AVAPRO) 300 MG tablet Take 300 mg by mouth nightly   Yes Historical Provider, MD   dexamethasone (DECADRON) 2 MG tablet Take 2 mg by mouth 2 times daily (with meals)   Yes Historical Provider, MD   aspirin 81 MG chewable tablet Take 81 mg by mouth daily   Yes Historical Provider, MD   hydrochlorothiazide (HYDRODIURIL) 25 MG tablet Take 25 mg by mouth daily   Yes Historical Provider, MD   cholestyramine (QUESTRAN) 4 g packet Take 1 packet by mouth 3 times daily (with meals)   Yes Historical Provider, MD   Menthol, Topical Analgesic, (BIOFREEZE COLORLESS EX) Apply topically   Yes Historical Provider, MD   Multiple Vitamins-Iron (TAB-A-VITE/IRON) TABS  03/01/17  Yes Historical Provider, MD   amitriptyline (ELAVIL) 10 MG  tablet  02/16/17  Yes Historical Provider, MD   SYNTHROID 125 MCG tablet Take 112 mcg by mouth daily  06/24/14  Yes Historical Provider, MD   cetirizine (ZYRTEC) 10 MG tablet Take 10 mg by mouth daily  06/24/14  Yes Historical Provider, MD   vitamin D (CHOLECALCIFEROL) 1000 UNIT TABS tablet Take 2,000 Units by mouth daily    Yes Historical Provider, MD   budesonide (ENTOCORT EC) 3 MG extended release capsule Take 6 mg by mouth every morning    Historical Provider, MD   tiZANidine (ZANAFLEX) 4 MG tablet  03/11/17   Historical Provider, MD   gabapentin (NEURONTIN) 300 MG capsule Take 300 mg by mouth.. 02/17/17 03/19/17  Historical Provider, MD   tamoxifen (NOLVADEX) 20 MG tablet TAKE ONE TABLET BY MOUTH ONCE DAILY  Patient not taking: Reported on 03/09/2018 09/24/16   Sameer A Mahesh, MD   Multiple Vitamins-Minerals (THERAPEUTIC MULTIVITAMIN-MINERALS) tablet Take 1 tablet by mouth daily  Historical Provider, MD   ibuprofen (ADVIL;MOTRIN) 800 MG tablet Take 800 mg by mouth 3 times daily  06/24/14   Historical Provider, MD   cyclobenzaprine (FLEXERIL) 10 MG tablet Take 10 mg by mouth 3 times daily as needed  08/07/14   Historical Provider, MD   losartan (COZAAR) 100 MG tablet Take 100 mg by mouth daily  06/24/14   Historical Provider, MD           Allergies: Ciprofloxacin and Influenza vaccines    Social History:      TOBACCO:   reports that she quit smoking about 40 years ago. Her smoking use included cigarettes. She has never used smokeless tobacco.  ETOH:   reports that she drinks alcohol.  RECREATIONAL DRUG USE:   Social History     Substance and Sexual Activity   Drug Use No       Family History:           Problem Relation Age of Onset   ??? Other Brother         allergies   ??? Brain Cancer Brother    ??? High Blood Pressure Mother    ??? Stroke Mother    ??? Heart Disease Father    ??? High Blood Pressure Sister    ??? Other Sister         allergies   ??? Mult Sclerosis Sister              Review of Systems:    Review of  Systems      Physical Examination:    Vitals:    03/09/18 1122   BP: 109/70   Pulse: 76   Resp: 14       Physical Exam   Constitutional: She appears well-developed and well-nourished.   HENT:   Head: Normocephalic and atraumatic.   Eyes: Conjunctivae are normal.   Pupils equal     Neck: Normal range of motion. Neck supple.   Cardiovascular: Intact distal pulses.   No peripheral edema   Pulmonary/Chest: Effort normal. No respiratory distress.   Abdominal: Soft. There is no tenderness.   Musculoskeletal: Normal range of motion. She exhibits no deformity.   Neurological: She is alert. She has normal strength. Coordination normal.   Reflex Scores:       Bicep reflexes are 2+ on the right side and 2+ on the left side.       Patellar reflexes are 2+ on the right side and 2+ on the left side.       Achilles reflexes are 2+ on the right side and 2+ on the left side.  Motor 5/5 UE and LE except right upper extremity 4+ out of 5 throughout  Sensation intact LT  DTR's 2+ biceps, achilles and patella   Skin: Skin is warm, dry and intact.   Psychiatric: She has a normal mood and affect. Her speech is normal. Thought content normal.          Gait  normal      Results  Labs:  Last 24hrs  No results found for this or any previous visit (from the past 24 hour(s)).        ASSESSMENT / PLAN :     Is a 57 year old with complaints of headaches as well as visual complaints and weakness more in her right arm, all the symptoms have worsened over the last 6 months.  Her imaging reveals a left frontal extra-axial mass most consistent with meningioma although metastasis  cannot be completely ruled out.  Given her severe headaches and her weakness in the right arm, I have offered her again a left-sided craniotomy for resection of tumor using Stealth navigation and she agrees to proceed.  I would like to have medical clearance before proceeding.  We will start scheduling her for surgery.  I've asked her to stop aspirin at least 7 days before  surgery.      There are no diagnoses linked to this encounter.          Electronically signed by Clayborn Bigness, MD on 03/09/2018 at 11:35 AM

## 2018-03-12 ENCOUNTER — Encounter: Payer: Self-pay | Admitting: Nurse Practitioner

## 2018-03-13 ENCOUNTER — Other Ambulatory Visit: Payer: Self-pay

## 2018-03-13 MED ORDER — LINZESS 145 MCG PO CAPS: 145.0000 ug | ORAL_CAPSULE | Freq: Every day | ORAL | 3 refills | Status: DC

## 2018-03-13 MED ORDER — LISINOPRIL 2.5 MG PO TABS: ORAL_TABLET | ORAL | 5 refills | Status: DC

## 2018-03-15 ENCOUNTER — Encounter: Attending: Hematology & Oncology | Primary: Internal Medicine

## 2018-03-16 ENCOUNTER — Other Ambulatory Visit: Payer: Self-pay

## 2018-03-16 DIAGNOSIS — F5101 Primary insomnia: Secondary | ICD-10-CM

## 2018-03-16 MED ORDER — ZOLPIDEM TARTRATE 10 MG PO TABS: 10.0000 mg | ORAL_TABLET | Freq: Every evening | ORAL | 0 refills | Status: DC | PRN

## 2018-03-16 MED ORDER — DULAGLUTIDE 1.5 MG/0.5ML ~~LOC~~ SOAJ: SUBCUTANEOUS | 3 refills | Status: DC

## 2018-03-16 NOTE — Telephone Encounter (Signed)
As per dr Humphrey Rolls called in [facility]  for zolpidem 10 mg one tab po at bedtime 20 tabs until next appt

## 2018-03-21 ENCOUNTER — Inpatient Hospital Stay: Admit: 2018-03-21 | Attending: Neurological Surgery | Primary: Internal Medicine

## 2018-03-21 LAB — CBC
Hematocrit: 36.1 % (ref 35.0–47.0)
Hemoglobin: 12.1 g/dL (ref 11.7–16.0)
MCH: 30.9 pg (ref 26.0–34.0)
MCHC: 33.6 % (ref 32.0–36.0)
MCV: 91.9 fL (ref 79.0–98.0)
MPV: 8 fL (ref 7.4–10.4)
Platelets: 386 10*3/uL (ref 140–440)
RBC: 3.93 10*6/uL (ref 3.80–5.20)
RDW: 13.7 % (ref 11.5–14.5)
WBC: 7.9 10*3/uL (ref 3.6–10.7)

## 2018-03-21 LAB — BASIC METABOLIC PANEL
Anion Gap: 10 NA
BUN: 12 mg/dL (ref 7–20)
CO2: 25 mmol/L (ref 22–30)
Calcium: 9.5 mg/dL (ref 8.4–10.4)
Chloride: 104 mmol/L (ref 98–107)
Creatinine: 0.82 mg/dL (ref 0.52–1.25)
Glucose: 88 mg/dL (ref 70–100)
Potassium: 2.7 mmol/L — ABNORMAL LOW (ref 3.5–5.1)
Sodium: 139 mmol/L (ref 135–145)
eGFR AA: >60 mL/min (ref >60)
eGFR NON-AA: >60 mL/min (ref >60)

## 2018-03-21 NOTE — H&P (View-Only) (Signed)
Comprehensive PreSurgical History and Physical      Name: Kelly Miles MRN: [id number] DOB: 01-20-1961    (Age-57 y.o.)     Date of Service: Pt seen/examined on 03/22/2018     Chief Complaint:  Reports headaches with associated blurred vision and balance trouble for several months. Found to have a probable meningioma. Following with Dr. Christiana Fuchs. Denies CP, SOB, or recent illness. Does report she had a recent flare of colitis. Following with GI.      History Of Present Illness:    57 y.o. female who we are asked to see/evaluate by Clayborn Bigness, MD for pre-operative evaluation prior to Procedure Notes:  [place].    Past Medical History:        Diagnosis Date   ??? Allergic rhinitis    ??? Breast cancer ([place])    ??? Chronic back pain    ??? Colitis    ??? Fibromyalgia    ??? Hypertension    ??? Hypothyroidism    ??? Irritable bowel syndrome    ??? Neuropathy    ??? Osteoarthritis     back   ??? Restless legs syndrome        Past Surgical History:        Procedure Laterality Date   ??? BREAST SURGERY     ??? CHOLECYSTECTOMY     ??? COLONOSCOPY     ??? INNER EAR SURGERY      both sides   ??? TONSILLECTOMY AND ADENOIDECTOMY         Medications Prior to Admission:    Prior to Admission medications    Medication Sig Start Date End Date Taking? Authorizing Provider   potassium chloride (KLOR-CON M) 20 MEQ extended release tablet Take 2 tablets by mouth daily for 5 days 03/22/18 03/27/18 Yes Dayna Barker, APRN - CNP   topiramate (TOPAMAX) 100 MG tablet Take 100 mg by mouth 2 times daily   Yes Historical Provider, MD   DIPHENOXYLATE-ATROPINE PO Take 2.5 mg by mouth 4 times daily For Diarrhea/Colitis   Yes Historical Provider, MD   Doxylamine Succinate, Sleep, (SLEEP AID PO) Take 1 capsule by mouth nightly    Yes Historical Provider, MD   irbesartan (AVAPRO) 300 MG tablet Take 300 mg by mouth daily    Yes Historical Provider, MD   hydrochlorothiazide (HYDRODIURIL) 25 MG tablet Take 25  mg by mouth daily   Yes Historical Provider, MD   cholestyramine (QUESTRAN) 4 g packet Take 1 packet by mouth 2 times daily    Yes Historical Provider, MD   Menthol, Topical Analgesic, (BIOFREEZE COLORLESS EX) Apply topically as needed    Yes Historical Provider, MD   tiZANidine (ZANAFLEX) 4 MG tablet Take 4 mg by mouth 2 times daily  03/11/17  Yes Historical Provider, MD   Multiple Vitamins-Iron (TAB-A-VITE/IRON) TABS Take 1 tablet by mouth daily  03/01/17  Yes Historical Provider, MD   amitriptyline (ELAVIL) 10 MG tablet Take 30 mg by mouth nightly  02/16/17  Yes Historical Provider, MD   SYNTHROID 125 MCG tablet Take 112 mcg by mouth daily  06/24/14  Yes Historical Provider, MD   cetirizine (ZYRTEC) 10 MG tablet Take 10 mg by mouth daily  06/24/14  Yes Historical Provider, MD   vitamin D (CHOLECALCIFEROL) 1000 UNIT TABS tablet Take 2,000 Units by mouth daily    Yes Historical Provider, MD   aspirin 81 MG chewable tablet Take 81 mg by mouth daily Patient stopped  per Dr. Christiana Fuchs 10 days prior to surgery    Historical Provider, MD   budesonide (ENTOCORT EC) 3 MG extended release capsule Take 6 mg by mouth every morning    Historical Provider, MD       ANTICOAGULATION: Yes, ASA  CHRONIC STEROID USE:  No  CHRONIC NARCOTIC USE: No    Allergies:  Adhesive tape; Ciprofloxacin; and Influenza vaccines    If patient has opioid allergy, is it okay to take Acetaminophen: N/A      Social History:   TOBACCO:   reports that she quit smoking about 40 years ago. Her smoking use included cigarettes. She has never used smokeless tobacco.  ETOH:   reports current alcohol use. Social   Social History     Substance and Sexual Activity   Drug Use No        Family History:      Problem Relation Age of Onset   ??? Other Brother         allergies   ??? Brain Cancer Brother    ??? High Blood Pressure Mother    ??? Stroke Mother    ??? Heart Disease Father    ??? High Blood Pressure Sister    ??? Other Sister         allergies   ??? Mult Sclerosis Sister         REVIEW OF SYSTEMS:   Pertinent positives as noted in the HPI. All other systems reviewed and negative.    PHYSICAL EXAM:  Vitals:  BP 116/73    Pulse 88    Temp 97.6 ??F (36.4 ??C) (Temporal)    Resp 18    Ht 5\' 6"  (1.676 m)    Wt 162 lb 12.8 oz (73.8 kg)    SpO2 98%    BMI 26.28 kg/m??   BMI Classification:  Overweight (BMI 25.0-29.9)  General appearance: No apparent distress, appears stated age and cooperative.  HEENT: Normal cephalic, atraumatic without obvious deformity. Pupils equal, round, and reactive to light.  Extra ocular muscles intact. Conjunctivae/corneas clear.  Neck: No jugular venous distention. Trachea midline.  Respiratory:  Normal respiratory effort. Clear to auscultation, bilaterally without Rales/Wheezes/Rhonchi.  Cardiovascular: Regular rate and rhythm with normal S1/S2 without murmurs, rubs or gallops.  Abdomen: Soft, non-tender, non-distended with normal bowel sounds.  Musculoskeletal: No clubbing, cyanosis or edema bilaterally. Full ROM of all extremities.   Skin: Skin color, texture, turgor normal.  No rashes or lesions.  Neurologic:  Neurovascularly intact without any focal sensory/motor deficits. Cranial nerves: II-XII intact, grossly non-focal.  Psychiatric: Alert and oriented, anxious    Labs:   Lab Results   Component Value Date    WBC 7.9 03/21/2018    HGB 12.1 03/21/2018    HCT 36.1 03/21/2018    MCV 91.9 03/21/2018    PLT 386 03/21/2018     Lab Results   Component Value Date    NA 139 03/21/2018    K 2.7 (L) 03/21/2018    CL 104 03/21/2018    CO2 25 03/21/2018    BUN 12 03/21/2018    CREATININE 0.82 03/21/2018    GLUCOSE 88 03/21/2018    CALCIUM 9.5 03/21/2018       Lee's Simple Cardiac Risk Index:  High-risk surgery (intraperitoneal, intrathoracic or suprainguinal vascular surgery): no  Coronary artery disease: no  Congestive heart failure: no  History of cerebrovascular disease: no  Insulin treatment for diabetes mellitus: no  Preoperative serum creatinine > 2.0mg /dL:  no    Total:   Interpretation:  0 Points Class I   1 Point Class II   2 Points Class III   >=3 Points Class IV        OSA Screening (STOP-Bang)  NO NOT COMPLETE IF ALREADY DIAGNOSED WITH OSA  Do you snore loudly (loud enough to be heard through closed doors, or your bed partner elbows you for snoring at night)?no    Do you often feel tired, fatigued, or sleepy during the daytime (such as falling asleep during driving)?no    Has anyone observed you stop breathing or choking/gasping during your sleep?no    Do you have or are being treated for high blood pressure? yes - 1 Point    Body mass index more than 35 kg/m2?  no    Age older than 59 years old? yes - 1 Point    Neck size large? (measured around Adam's apple)  For female, is your shirt collar 17 inches or larger?  no  For female, is your shirt collar 16 inches or larger?no    Gender = Female? no    STOP BANG SCORE = 2    Information from PAT significant to anesthetic history as recorded by Dayna Barker, APRN - CNP  on 03/22/2018        Anesthesia Component:    Any problems with anesthesia?: Past General Anesthetic without complications  History of difficult intubation?: None reported  History of difficult IV access?: None reported  Family History of problems with anesthesia?: None noted   Risk Factors PONV:    Female: Yes (+1)  Motion sick or prior PONV: no  NonSmoker: Nonsmoker - (+1)  Opiods for Procedure: Yes (+1)            Airway and Dentition Patient PAT Education - Risk Assesment      ASA Score:ASA 3 - other Hx of breast CA  Mallampati  PAT assesment: 3        TMD: >4cm  Mouth Size: WNL  Neck: WNL  Dentures: None  Partials: None  Overall Dentition: Teeth intact with none loose  Patient informed of dental injury risk      GERD:None      Frailty Risk Assessment: Not Applicable               No Risk Factors unless listed   Patient received procedure specific education prior to surgery per nursing     Tobacco:Patient not a current smoker    ETOH:  reports  current alcohol use.  Drug:   Social History     Substance and Sexual Activity   Drug Use No        Functional Capacity: (>4 METS implies low cardiovascular risk from surgery):  Climb a flight of stairs or walk up a hill (5.50 METs)    PAT Pain Score:8     Postop Pain Management Plan (Pain consult ordered?): Pain consult not indicated at this time               EKG:  I have reviewed the EKG if indicated and there is no concern for ischemic changes or significant heart block.    ECHO and ZD:GLOV on file   No results found for: LVEF, LVEFMODE    ASSESSMENT/PLAN:  Patient is considered low/intermediate  risk for this intermediate risk procedure/surgery (Procedure Notes:  CRANIOTOMY USING STEALTH NAVIGATION FOR TUMOR RESECTION) with no reducible risk factors. Based on the above evaluation, the benefits of  the planned procedure likely exceed the risks.  The patient is medically optimized to proceed with the planned procedure without any further cardiopulmonary testing.    1)  Meningioma   Managed per surgery   2)  HTN  Stable on home medication   BP Readings from Last 3 Encounters:   03/21/18 116/73   03/09/18 109/70   02/28/18 110/78     3)  Hypothyroid  On Synthroid   4)  Hx of breast CA  Follow up with oncology   5) Chronic pain  In pain management, pain consult placed   6) Colitis  Stable, following with GI, Not yet taking Budesonide   7) Hypokalemia  K+ 2.7, 2/2 to diarrhea vs. HCTZ, PO replacement sent to patient's pharmacy, patient aware, all questions answered, will recheck DOS      Labs Ordered:  YES - PER PAT PROTOCOL  ECG Ordered:  YES - PER PAT PROTOCOL  Sleep Referral Ordered:  NO - NEGATIVE SCREEN PER SLEEP REFERRAL PROTOCOL          Electronically signed by: Dayna Barker, APRN - CNP     Date: 03/22/2018 at 12:35 PM               PAT Protocol referenced includes:  1) Anesthesia Lab Protocol Orders  2) Perioperative Cardiovascular Risk Assessment  3) Anesthesia Assessment   4) Pain Assessment and Acute  Pain Service Consult (if appropriate)  5) Medical Clearance/Consult from Internal Medicine ([place])  6) Shower/Wash Order (for designated surgeries)  7) OSA Screen and Sleep Clinic Referral (if appropriate)

## 2018-03-21 NOTE — Other (Unsigned)
Patient Acct Nbr: [id number]   Primary AUTH/CERT:   [place] Name: [place] name: [facility]  Primary Insurance Group Number: [facility]  Primary Insurance Plan Type: Health  Primary Insurance Policy Number: [id number]

## 2018-03-21 NOTE — Telephone Encounter (Signed)
Patient called asking if she can start taking her colitis medication now or wait until after surgery. It is Budesonide 3mg . Please advise. Thank you.

## 2018-03-21 NOTE — Discharge Instructions (Signed)
Please bring your [facility] Surgical Information folder on the day of surgery.    Please mark the last dose taken (date and time ) on your Daily Medications List provided in your After Visit Summary.     Please bring a photo ID and insurance information     TAKE the following medications the morning of your surgery SYNTHROID,  TOPIRAMATE (TOPAMAX)    You may take your prescription pain medications.  You may take Tylenol (Acetaminophen) if needed for pain.    No Motrin, Ibuprofen, or Advil 24 hours prior to surgery, or longer if instructed by your surgeon.     No Aleve or Naprosyn 3 days prior to surgery, or longer if instructed by your surgeon.    NO ASPIRIN OR ASPIRIN CONTAINING PRODUCTS 5 DAYS PRIOR TO SURGERY OR LONGER IF INSTRUCTED BY DR CLAPPER    Additional instructions***    Please shower with an antibacterial soap( example  DIAL OR SAFEGUARD)    FOLLOW ANY OTHER INSTRUCTIONS THAT DR Christiana Fuchs MAY HAVE GIVEN YOU    You will receive a reminder call the day before surgery with your Same Day Surgery arrival time.    If you have specific questions, please call your surgeon.

## 2018-03-21 NOTE — H&P (Signed)
Comprehensive PreSurgical History and Physical      Name: Kelly Miles MRN: [id number] DOB: 28-Mar-1961    (Age-57 y.o.)     Date of Service: Pt seen/examined on 03/22/2018     Chief Complaint:  Reports headaches with associated blurred vision and balance trouble for several months. Found to have a probable meningioma. Following with Dr. Christiana Fuchs. Denies CP, SOB, or recent illness. Does report she had a recent flare of colitis. Following with GI.      History Of Present Illness:    56 y.o. female who we are asked to see/evaluate by Clayborn Bigness, MD for pre-operative evaluation prior to Procedure Notes:  [place].    Past Medical History:        Diagnosis Date   ??? Allergic rhinitis    ??? Breast cancer ([place])    ??? Chronic back pain    ??? Colitis    ??? Fibromyalgia    ??? Hypertension    ??? Hypothyroidism    ??? Irritable bowel syndrome    ??? Neuropathy    ??? Osteoarthritis     back   ??? Restless legs syndrome        Past Surgical History:        Procedure Laterality Date   ??? BREAST SURGERY     ??? CHOLECYSTECTOMY     ??? COLONOSCOPY     ??? INNER EAR SURGERY      both sides   ??? TONSILLECTOMY AND ADENOIDECTOMY         Medications Prior to Admission:    Prior to Admission medications    Medication Sig Start Date End Date Taking? Authorizing Provider   potassium chloride (KLOR-CON M) 20 MEQ extended release tablet Take 2 tablets by mouth daily for 5 days 03/22/18 03/27/18 Yes Dayna Barker, APRN - CNP   topiramate (TOPAMAX) 100 MG tablet Take 100 mg by mouth 2 times daily   Yes Historical Provider, MD   DIPHENOXYLATE-ATROPINE PO Take 2.5 mg by mouth 4 times daily For Diarrhea/Colitis   Yes Historical Provider, MD   Doxylamine Succinate, Sleep, (SLEEP AID PO) Take 1 capsule by mouth nightly    Yes Historical Provider, MD   irbesartan (AVAPRO) 300 MG tablet Take 300 mg by mouth daily    Yes Historical Provider, MD   hydrochlorothiazide (HYDRODIURIL) 25 MG tablet Take 25  mg by mouth daily   Yes Historical Provider, MD   cholestyramine (QUESTRAN) 4 g packet Take 1 packet by mouth 2 times daily    Yes Historical Provider, MD   Menthol, Topical Analgesic, (BIOFREEZE COLORLESS EX) Apply topically as needed    Yes Historical Provider, MD   tiZANidine (ZANAFLEX) 4 MG tablet Take 4 mg by mouth 2 times daily  03/11/17  Yes Historical Provider, MD   Multiple Vitamins-Iron (TAB-A-VITE/IRON) TABS Take 1 tablet by mouth daily  03/01/17  Yes Historical Provider, MD   amitriptyline (ELAVIL) 10 MG tablet Take 30 mg by mouth nightly  02/16/17  Yes Historical Provider, MD   SYNTHROID 125 MCG tablet Take 112 mcg by mouth daily  06/24/14  Yes Historical Provider, MD   cetirizine (ZYRTEC) 10 MG tablet Take 10 mg by mouth daily  06/24/14  Yes Historical Provider, MD   vitamin D (CHOLECALCIFEROL) 1000 UNIT TABS tablet Take 2,000 Units by mouth daily    Yes Historical Provider, MD   aspirin 81 MG chewable tablet Take 81 mg by mouth daily Patient stopped  per Dr. Christiana Fuchs 10 days prior to surgery    Historical Provider, MD   budesonide (ENTOCORT EC) 3 MG extended release capsule Take 6 mg by mouth every morning    Historical Provider, MD       ANTICOAGULATION: Yes, ASA  CHRONIC STEROID USE:  No  CHRONIC NARCOTIC USE: No    Allergies:  Adhesive tape; Ciprofloxacin; and Influenza vaccines    If patient has opioid allergy, is it okay to take Acetaminophen: N/A      Social History:   TOBACCO:   reports that she quit smoking about 40 years ago. Her smoking use included cigarettes. She has never used smokeless tobacco.  ETOH:   reports current alcohol use. Social   Social History     Substance and Sexual Activity   Drug Use No        Family History:      Problem Relation Age of Onset   ??? Other Brother         allergies   ??? Brain Cancer Brother    ??? High Blood Pressure Mother    ??? Stroke Mother    ??? Heart Disease Father    ??? High Blood Pressure Sister    ??? Other Sister         allergies   ??? Mult Sclerosis Sister         REVIEW OF SYSTEMS:   Pertinent positives as noted in the HPI. All other systems reviewed and negative.    PHYSICAL EXAM:  Vitals:  BP 116/73    Pulse 88    Temp 97.6 ??F (36.4 ??C) (Temporal)    Resp 18    Ht 5\' 6"  (1.676 m)    Wt 162 lb 12.8 oz (73.8 kg)    SpO2 98%    BMI 26.28 kg/m??   BMI Classification:  Overweight (BMI 25.0-29.9)  General appearance: No apparent distress, appears stated age and cooperative.  HEENT: Normal cephalic, atraumatic without obvious deformity. Pupils equal, round, and reactive to light.  Extra ocular muscles intact. Conjunctivae/corneas clear.  Neck: No jugular venous distention. Trachea midline.  Respiratory:  Normal respiratory effort. Clear to auscultation, bilaterally without Rales/Wheezes/Rhonchi.  Cardiovascular: Regular rate and rhythm with normal S1/S2 without murmurs, rubs or gallops.  Abdomen: Soft, non-tender, non-distended with normal bowel sounds.  Musculoskeletal: No clubbing, cyanosis or edema bilaterally. Full ROM of all extremities.   Skin: Skin color, texture, turgor normal.  No rashes or lesions.  Neurologic:  Neurovascularly intact without any focal sensory/motor deficits. Cranial nerves: II-XII intact, grossly non-focal.  Psychiatric: Alert and oriented, anxious    Labs:   Lab Results   Component Value Date    WBC 7.9 03/21/2018    HGB 12.1 03/21/2018    HCT 36.1 03/21/2018    MCV 91.9 03/21/2018    PLT 386 03/21/2018     Lab Results   Component Value Date    NA 139 03/21/2018    K 2.7 (L) 03/21/2018    CL 104 03/21/2018    CO2 25 03/21/2018    BUN 12 03/21/2018    CREATININE 0.82 03/21/2018    GLUCOSE 88 03/21/2018    CALCIUM 9.5 03/21/2018       Lee's Simple Cardiac Risk Index:  High-risk surgery (intraperitoneal, intrathoracic or suprainguinal vascular surgery): no  Coronary artery disease: no  Congestive heart failure: no  History of cerebrovascular disease: no  Insulin treatment for diabetes mellitus: no  Preoperative serum creatinine > 2.0mg /dL:  no    Total:   Interpretation:  0 Points Class I   1 Point Class II   2 Points Class III   >=3 Points Class IV        OSA Screening (STOP-Bang)  NO NOT COMPLETE IF ALREADY DIAGNOSED WITH OSA  Do you snore loudly (loud enough to be heard through closed doors, or your bed partner elbows you for snoring at night)?no    Do you often feel tired, fatigued, or sleepy during the daytime (such as falling asleep during driving)?no    Has anyone observed you stop breathing or choking/gasping during your sleep?no    Do you have or are being treated for high blood pressure? yes - 1 Point    Body mass index more than 35 kg/m2?  no    Age older than 59 years old? yes - 1 Point    Neck size large? (measured around Adam's apple)  For female, is your shirt collar 17 inches or larger?  no  For female, is your shirt collar 16 inches or larger?no    Gender = Female? no    STOP BANG SCORE = 2    Information from PAT significant to anesthetic history as recorded by Dayna Barker, APRN - CNP  on 03/22/2018        Anesthesia Component:    Any problems with anesthesia?: Past General Anesthetic without complications  History of difficult intubation?: None reported  History of difficult IV access?: None reported  Family History of problems with anesthesia?: None noted   Risk Factors PONV:    Female: Yes (+1)  Motion sick or prior PONV: no  NonSmoker: Nonsmoker - (+1)  Opiods for Procedure: Yes (+1)            Airway and Dentition Patient PAT Education - Risk Assesment      ASA Score:ASA 3 - other Hx of breast CA  Mallampati  PAT assesment: 3        TMD: >4cm  Mouth Size: WNL  Neck: WNL  Dentures: None  Partials: None  Overall Dentition: Teeth intact with none loose  Patient informed of dental injury risk      GERD:None      Frailty Risk Assessment: Not Applicable               No Risk Factors unless listed   Patient received procedure specific education prior to surgery per nursing     Tobacco:Patient not a current smoker    ETOH:  reports  current alcohol use.  Drug:   Social History     Substance and Sexual Activity   Drug Use No        Functional Capacity: (>4 METS implies low cardiovascular risk from surgery):  Climb a flight of stairs or walk up a hill (5.50 METs)    PAT Pain Score:8     Postop Pain Management Plan (Pain consult ordered?): Pain consult not indicated at this time               EKG:  I have reviewed the EKG if indicated and there is no concern for ischemic changes or significant heart block.    ECHO and SW:NIOE on file   No results found for: LVEF, LVEFMODE    ASSESSMENT/PLAN:  Patient is considered low/intermediate  risk for this intermediate risk procedure/surgery (Procedure Notes:  CRANIOTOMY USING STEALTH NAVIGATION FOR TUMOR RESECTION) with no reducible risk factors. Based on the above evaluation, the benefits of  the planned procedure likely exceed the risks.  The patient is medically optimized to proceed with the planned procedure without any further cardiopulmonary testing.    1)  Meningioma   Managed per surgery   2)  HTN  Stable on home medication   BP Readings from Last 3 Encounters:   03/21/18 116/73   03/09/18 109/70   02/28/18 110/78     3)  Hypothyroid  On Synthroid   4)  Hx of breast CA  Follow up with oncology   5) Chronic pain  In pain management, pain consult placed   6) Colitis  Stable, following with GI, Not yet taking Budesonide   7) Hypokalemia  K+ 2.7, 2/2 to diarrhea vs. HCTZ, PO replacement sent to patient's pharmacy, patient aware, all questions answered, will recheck DOS      Labs Ordered:  YES - PER PAT PROTOCOL  ECG Ordered:  YES - PER PAT PROTOCOL  Sleep Referral Ordered:  NO - NEGATIVE SCREEN PER SLEEP REFERRAL PROTOCOL          Electronically signed by: Dayna Barker, APRN - CNP     Date: 03/22/2018 at 12:35 PM               PAT Protocol referenced includes:  1) Anesthesia Lab Protocol Orders  2) Perioperative Cardiovascular Risk Assessment  3) Anesthesia Assessment   4) Pain Assessment and Acute  Pain Service Consult (if appropriate)  5) Medical Clearance/Consult from Internal Medicine ([place])  6) Shower/Wash Order (for designated surgeries)  7) OSA Screen and Sleep Clinic Referral (if appropriate)

## 2018-03-22 MED ORDER — POTASSIUM CHLORIDE CRYS ER 20 MEQ PO TBCR
20 MEQ | ORAL_TABLET | Freq: Every day | ORAL | 0 refills | Status: DC
Start: 2018-03-22 — End: 2018-03-28

## 2018-03-27 ENCOUNTER — Inpatient Hospital Stay
Admit: 2018-03-27 | Discharge: 2018-03-28 | Disposition: A | Discharge status: Home / Self Care | Payer: MEDICAID | Source: Ambulatory Visit | Attending: Neurological Surgery | Admitting: Neurological Surgery

## 2018-03-27 DIAGNOSIS — D329 Benign neoplasm of meninges, unspecified: Principal | ICD-10-CM

## 2018-03-27 LAB — BASIC METABOLIC PANEL
Anion Gap: 8 NA
BUN: 12 mg/dL (ref 7–20)
CO2: 21 mmol/L — ABNORMAL LOW (ref 22–30)
Calcium: 9.4 mg/dL (ref 8.4–10.4)
Chloride: 109 mmol/L — ABNORMAL HIGH (ref 98–107)
Creatinine: 0.78 mg/dL (ref 0.52–1.25)
Glucose: 160 mg/dL — ABNORMAL HIGH (ref 70–100)
Potassium: 4.2 mmol/L (ref 3.5–5.1)
Sodium: 137 mmol/L (ref 135–145)
eGFR AA: >60 mL/min (ref >60)
eGFR NON-AA: >60 mL/min (ref >60)

## 2018-03-27 LAB — TYPE AND SCREEN
Antibody Screen: NEGATIVE NA
Rh Type: POSITIVE NA

## 2018-03-27 LAB — HEPATIC FUNCTION PANEL
ALT: 25 U/L (ref 13–69)
AST: 34 U/L (ref 15–46)
Albumin,Serum: 4 g/dL (ref 3.5–5.0)
Alkaline Phosphatase: 79 U/L (ref 38–126)
Bilirubin, Direct: 0 mg/dL (ref 0.0–0.3)
Total Bilirubin: 0.2 mg/dL (ref 0.2–1.3)
Total Protein: 6.7 g/dL (ref 6.3–8.2)

## 2018-03-27 LAB — CBC WITH AUTO DIFFERENTIAL
Basophils %: 0.4 % (ref 0.0–2.0)
Basophils Absolute: 0.1 10*3/uL (ref 0.0–0.2)
Eosinophils Absolute: 0 10*3/uL (ref 0.0–0.5)
Eosinophils: 0 % — ABNORMAL LOW (ref 1.0–6.0)
Granulocytes %: 93.8 % — ABNORMAL HIGH (ref 40.0–80.0)
Hematocrit: 34.8 % — ABNORMAL LOW (ref 35.0–47.0)
Hemoglobin: 11.4 g/dL — ABNORMAL LOW (ref 11.7–16.0)
Lymphocyte %: 4.7 % — ABNORMAL LOW (ref 20.0–40.0)
Lymphocytes Absolute: 0.6 10*3/uL — ABNORMAL LOW (ref 1.0–4.3)
MCH: 30.1 pg (ref 26.0–34.0)
MCHC: 32.8 % (ref 32.0–36.0)
MCV: 91.9 fL (ref 79.0–98.0)
MPV: 7.6 fL (ref 7.4–10.4)
Monocytes %: 1.1 % — ABNORMAL LOW (ref 2.0–10.0)
Monocytes Absolute: 0.1 10*3/uL (ref 0.0–0.8)
Neutrophils Absolute: 11.2 10*3/uL — ABNORMAL HIGH (ref 1.8–7.0)
Platelets: 392 10*3/uL (ref 140–440)
RBC: 3.79 10*6/uL — ABNORMAL LOW (ref 3.80–5.20)
RDW: 13.5 % (ref 11.5–14.5)
WBC: 12 10*3/uL — ABNORMAL HIGH (ref 3.6–10.7)

## 2018-03-27 LAB — CALCIUM, IONIZED
Calcium, Ionized: 4.5 mg/dL (ref 4.30–5.20)
pH, Bld: 7.37 NA (ref 7.31–7.46)

## 2018-03-27 LAB — MAGNESIUM: Magnesium: 1.8 mg/dL (ref 1.6–2.3)

## 2018-03-27 LAB — PHOSPHORUS: Phosphorus: 3.6 mg/dL (ref 2.5–4.5)

## 2018-03-27 MED ORDER — LABETALOL HCL 5 MG/ML IV SOLN
5 MG/ML | INTRAVENOUS | Status: DC | PRN
Start: 2018-03-27 — End: 2018-03-27

## 2018-03-27 MED ORDER — ESMOLOL HCL 100 MG/10ML IV SOLN
100 | INTRAVENOUS | Status: AC
Start: 2018-03-27 — End: 2018-03-27

## 2018-03-27 MED ORDER — SODIUM CHLORIDE 0.9 % IV SOLN
0.9 % | INTRAVENOUS | Status: DC
Start: 2018-03-27 — End: 2018-03-27
  Administered 2018-03-27: 16:00:00 via INTRAVENOUS

## 2018-03-27 MED ORDER — HYDROMORPHONE HCL 1 MG/ML IJ SOLN
1 MG/ML | INTRAMUSCULAR | Status: DC | PRN
Start: 2018-03-27 — End: 2018-03-27

## 2018-03-27 MED ORDER — GLYCOPYRROLATE 0.2 MG/ML IJ SOLN
0.2 | INTRAMUSCULAR | Status: AC
Start: 2018-03-27 — End: 2018-03-27

## 2018-03-27 MED ORDER — AMITRIPTYLINE HCL 10 MG PO TABS
10 MG | Freq: Every evening | ORAL | Status: DC
Start: 2018-03-27 — End: 2018-03-28
  Administered 2018-03-28: 03:00:00 30 mg via ORAL

## 2018-03-27 MED ORDER — LIDOCAINE HCL (PF) 2 % IJ SOLN
2 | INTRAMUSCULAR | Status: AC
Start: 2018-03-27 — End: 2018-03-27

## 2018-03-27 MED ORDER — MEPERIDINE HCL 25 MG/ML IJ SOLN
25 MG/ML | INTRAMUSCULAR | Status: DC | PRN
Start: 2018-03-27 — End: 2018-03-27

## 2018-03-27 MED ORDER — MORPHINE SULFATE (PF) 4 MG/ML IV SOLN
4 MG/ML | INTRAVENOUS | Status: DC | PRN
Start: 2018-03-27 — End: 2018-03-28

## 2018-03-27 MED ORDER — DIPHENHYDRAMINE HCL 50 MG/ML IJ SOLN
50 MG/ML | Freq: Once | INTRAMUSCULAR | Status: DC | PRN
Start: 2018-03-27 — End: 2018-03-27

## 2018-03-27 MED ORDER — NORMAL SALINE FLUSH 0.9 % IV SOLN
0.9 % | Freq: Two times a day (BID) | INTRAVENOUS | Status: DC
Start: 2018-03-27 — End: 2018-03-27

## 2018-03-27 MED ORDER — ONDANSETRON HCL 4 MG/2ML IJ SOLN
4 MG/2ML | Freq: Four times a day (QID) | INTRAMUSCULAR | Status: DC | PRN
Start: 2018-03-27 — End: 2018-03-28

## 2018-03-27 MED ORDER — NORMAL SALINE FLUSH 0.9 % IV SOLN
0.9 % | INTRAVENOUS | Status: DC | PRN
Start: 2018-03-27 — End: 2018-03-28

## 2018-03-27 MED ORDER — MAGNESIUM HYDROXIDE 400 MG/5ML PO SUSP
400 MG/5ML | Freq: Every day | ORAL | Status: DC | PRN
Start: 2018-03-27 — End: 2018-03-28

## 2018-03-27 MED ORDER — PROPOFOL 200 MG/20ML IV EMUL
200 | INTRAVENOUS | Status: AC
Start: 2018-03-27 — End: 2018-03-27

## 2018-03-27 MED ORDER — BACITRACIN ZINC 500 UNIT/GM EX OINT
500 UNIT/GM | CUTANEOUS | Status: AC
Start: 2018-03-27 — End: 2018-03-27

## 2018-03-27 MED ORDER — PROMETHAZINE HCL 25 MG/ML IJ SOLN
25 MG/ML | Freq: Once | INTRAMUSCULAR | Status: DC | PRN
Start: 2018-03-27 — End: 2018-03-27

## 2018-03-27 MED ORDER — OXYCODONE HCL 5 MG PO TABS
5 MG | ORAL | Status: DC | PRN
Start: 2018-03-27 — End: 2018-03-28
  Administered 2018-03-28: 03:00:00 5 mg via ORAL

## 2018-03-27 MED ORDER — DEXAMETHASONE 4 MG PO TABS
4 MG | Freq: Two times a day (BID) | ORAL | Status: DC
Start: 2018-03-27 — End: 2018-03-27

## 2018-03-27 MED ORDER — LACTATED RINGERS IV SOLN
INTRAVENOUS | Status: DC
Start: 2018-03-27 — End: 2018-03-27
  Administered 2018-03-27: 13:00:00 via INTRAVENOUS

## 2018-03-27 MED ORDER — NORMAL SALINE FLUSH 0.9 % IV SOLN
0.9 % | INTRAVENOUS | Status: DC | PRN
Start: 2018-03-27 — End: 2018-03-27

## 2018-03-27 MED ORDER — DOCUSATE SODIUM 100 MG PO CAPS
100 MG | Freq: Every day | ORAL | Status: DC
Start: 2018-03-27 — End: 2018-03-28
  Administered 2018-03-28: 14:00:00 100 mg via ORAL

## 2018-03-27 MED ORDER — ALPRAZOLAM 0.25 MG PO TBDP
0.25 MG | ORAL | Status: DC | PRN
Start: 2018-03-27 — End: 2018-03-27
  Administered 2018-03-27: 13:00:00 0.25 mg via ORAL

## 2018-03-27 MED ORDER — GABAPENTIN 300 MG PO CAPS
300 MG | Freq: Once | ORAL | Status: AC
Start: 2018-03-27 — End: 2018-03-27
  Administered 2018-03-27: 13:00:00 300 mg via ORAL

## 2018-03-27 MED ORDER — SUGAMMADEX SODIUM 200 MG/2ML IV SOLN
200 | INTRAVENOUS | Status: AC
Start: 2018-03-27 — End: 2018-03-27

## 2018-03-27 MED ORDER — HYDRALAZINE HCL 20 MG/ML IJ SOLN
20 MG/ML | INTRAMUSCULAR | Status: DC | PRN
Start: 2018-03-27 — End: 2018-03-27

## 2018-03-27 MED ORDER — DEXMEDETOMIDINE HCL 200 MCG/2ML IV SOLN
200 | INTRAVENOUS | Status: AC
Start: 2018-03-27 — End: 2018-03-27

## 2018-03-27 MED ORDER — CEFAZOLIN SODIUM-DEXTROSE 2-4 GM/100ML-% IV SOLN
2-4 GM/100ML-% | Freq: Three times a day (TID) | INTRAVENOUS | Status: AC
Start: 2018-03-27 — End: 2018-03-28
  Administered 2018-03-27 – 2018-03-28 (×2): 2 g via INTRAVENOUS

## 2018-03-27 MED ORDER — CHOLESTYRAMINE 4 G PO PACK
4 g | Freq: Two times a day (BID) | ORAL | Status: DC
Start: 2018-03-27 — End: 2018-03-28
  Administered 2018-03-27 – 2018-03-28 (×3): 4 via ORAL

## 2018-03-27 MED ORDER — ACETAMINOPHEN 325 MG PO TABS
325 MG | ORAL | Status: DC | PRN
Start: 2018-03-27 — End: 2018-03-28

## 2018-03-27 MED ORDER — FAMOTIDINE 20 MG PO TABS
20 MG | Freq: Once | ORAL | Status: AC
Start: 2018-03-27 — End: 2018-03-27
  Administered 2018-03-27: 13:00:00 20 mg via ORAL

## 2018-03-27 MED ORDER — LEVOTHYROXINE SODIUM 112 MCG PO TABS
112 MCG | Freq: Every day | ORAL | Status: DC
Start: 2018-03-27 — End: 2018-03-28
  Administered 2018-03-27 – 2018-03-28 (×2): 112 ug via ORAL

## 2018-03-27 MED ORDER — OXYCODONE HCL 5 MG PO TABS
5 MG | ORAL | Status: DC | PRN
Start: 2018-03-27 — End: 2018-03-28
  Administered 2018-03-27 – 2018-03-28 (×3): 10 mg via ORAL

## 2018-03-27 MED ORDER — MIDAZOLAM HCL 2 MG/2ML IJ SOLN
2 MG/ML | INTRAMUSCULAR | Status: AC
Start: 2018-03-27 — End: 2018-03-27

## 2018-03-27 MED ORDER — OXYCODONE HCL 5 MG PO TABS
5 MG | ORAL | Status: DC | PRN
Start: 2018-03-27 — End: 2018-03-27

## 2018-03-27 MED ORDER — THERAPEUTIC MULTIVIT/MINERAL PO TABS
Freq: Every day | ORAL | Status: DC
Start: 2018-03-27 — End: 2018-03-28
  Administered 2018-03-27 – 2018-03-28 (×2): 1 via ORAL

## 2018-03-27 MED ORDER — MICROFIBRILLAR COLL HEMOSTAT EX POWD
CUTANEOUS | Status: AC
Start: 2018-03-27 — End: 2018-03-27

## 2018-03-27 MED ORDER — REMIFENTANIL HCL 1 MG IV SOLR
1 | INTRAVENOUS | Status: AC
Start: 2018-03-27 — End: 2018-03-27

## 2018-03-27 MED ORDER — NORMAL SALINE FLUSH 0.9 % IV SOLN
0.9 % | Freq: Two times a day (BID) | INTRAVENOUS | Status: DC
Start: 2018-03-27 — End: 2018-03-28

## 2018-03-27 MED ORDER — ROCURONIUM BROMIDE 50 MG/5ML IV SOLN
50 | INTRAVENOUS | Status: AC
Start: 2018-03-27 — End: 2018-03-27

## 2018-03-27 MED ORDER — ONDANSETRON HCL 4 MG/2ML IJ SOLN
4 | INTRAMUSCULAR | Status: AC
Start: 2018-03-27 — End: 2018-03-27

## 2018-03-27 MED ORDER — GELATIN ABSORBABLE 100 EX MISC
100 | CUTANEOUS | Status: AC
Start: 2018-03-27 — End: 2018-03-27

## 2018-03-27 MED ORDER — TIZANIDINE HCL 4 MG PO TABS
4 MG | Freq: Two times a day (BID) | ORAL | Status: DC
Start: 2018-03-27 — End: 2018-03-28
  Administered 2018-03-28 (×2): 4 mg via ORAL

## 2018-03-27 MED ORDER — HYDRALAZINE HCL 20 MG/ML IJ SOLN
20 MG/ML | INTRAMUSCULAR | Status: DC | PRN
Start: 2018-03-27 — End: 2018-03-28

## 2018-03-27 MED ORDER — POTASSIUM CHLORIDE CRYS ER 10 MEQ PO TBCR
10 MEQ | Freq: Every day | ORAL | Status: DC
Start: 2018-03-27 — End: 2018-03-28

## 2018-03-27 MED ORDER — IPRATROPIUM-ALBUTEROL 0.5-2.5 (3) MG/3ML IN SOLN
RESPIRATORY_TRACT | Status: DC | PRN
Start: 2018-03-27 — End: 2018-03-28

## 2018-03-27 MED ORDER — TOPIRAMATE 100 MG PO TABS
100 MG | Freq: Two times a day (BID) | ORAL | Status: DC
Start: 2018-03-27 — End: 2018-03-28
  Administered 2018-03-27 – 2018-03-28 (×3): 100 mg via ORAL

## 2018-03-27 MED ORDER — BUDESONIDE 3 MG PO CPEP
3 MG | Freq: Every morning | ORAL | Status: DC
Start: 2018-03-27 — End: 2018-03-28
  Administered 2018-03-28: 14:00:00 6 mg via ORAL

## 2018-03-27 MED ORDER — ONDANSETRON HCL 4 MG/2ML IJ SOLN
4 MG/2ML | Freq: Once | INTRAMUSCULAR | Status: DC | PRN
Start: 2018-03-27 — End: 2018-03-27

## 2018-03-27 MED ORDER — PHENOL 1.4 % MT LIQD
1.4 % | OROMUCOSAL | Status: DC | PRN
Start: 2018-03-27 — End: 2018-03-28
  Administered 2018-03-27 – 2018-03-28 (×2): 1 via OROMUCOSAL

## 2018-03-27 MED ORDER — LACTATED RINGERS IV SOLN
INTRAVENOUS | Status: DC
Start: 2018-03-27 — End: 2018-03-28
  Administered 2018-03-27: 22:00:00 via INTRAVENOUS

## 2018-03-27 MED ORDER — LIDOCAINE-EPINEPHRINE 1 %-1:100000 IJ SOLN
1 %-:00000 | INTRAMUSCULAR | Status: AC
Start: 2018-03-27 — End: 2018-03-27

## 2018-03-27 MED ORDER — DEXAMETHASONE SODIUM PHOSPHATE 10 MG/ML IJ SOLN
10 | INTRAMUSCULAR | Status: AC
Start: 2018-03-27 — End: 2018-03-27

## 2018-03-27 MED ORDER — LIDOCAINE HCL (PF) 1 % IJ SOLN
1 % | Freq: Once | INTRAMUSCULAR | Status: DC | PRN
Start: 2018-03-27 — End: 2018-03-27

## 2018-03-27 MED ORDER — VITAMIN D 25 MCG (1000 UT) PO TABS
25 MCG (1000 UT) | Freq: Every day | ORAL | Status: DC
Start: 2018-03-27 — End: 2018-03-28
  Administered 2018-03-27 – 2018-03-28 (×2): 2000 [IU] via ORAL

## 2018-03-27 MED ORDER — CEFAZOLIN SODIUM-DEXTROSE 2-4 GM/100ML-% IV SOLN
2-4 GM/100ML-% | INTRAVENOUS | Status: DC
Start: 2018-03-27 — End: 2018-03-27

## 2018-03-27 MED ORDER — LORAZEPAM 0.5 MG PO TABS
0.5 MG | Freq: Every day | ORAL | Status: DC
Start: 2018-03-27 — End: 2018-03-27

## 2018-03-27 MED ORDER — HYDROCHLOROTHIAZIDE 25 MG PO TABS
25 MG | Freq: Every day | ORAL | Status: DC
Start: 2018-03-27 — End: 2018-03-28

## 2018-03-27 MED ORDER — MORPHINE SULFATE (PF) 4 MG/ML IV SOLN
4 MG/ML | INTRAVENOUS | Status: DC | PRN
Start: 2018-03-27 — End: 2018-03-28
  Administered 2018-03-27 – 2018-03-28 (×2): 4 mg via INTRAVENOUS

## 2018-03-27 MED ORDER — THROMBIN 5000 UNITS EX SOLR
5000 units | CUTANEOUS | Status: AC
Start: 2018-03-27 — End: 2018-03-27

## 2018-03-27 MED ORDER — DEXAMETHASONE 4 MG PO TABS
4 MG | Freq: Two times a day (BID) | ORAL | Status: DC
Start: 2018-03-27 — End: 2018-03-28
  Administered 2018-03-28 (×2): 4 mg via ORAL

## 2018-03-27 MED ORDER — HYDROMORPHONE HCL 2 MG/ML IJ SOLN
2 | INTRAMUSCULAR | Status: AC
Start: 2018-03-27 — End: 2018-03-27

## 2018-03-27 MED ORDER — LOSARTAN POTASSIUM 25 MG PO TABS
25 MG | Freq: Every day | ORAL | Status: DC
Start: 2018-03-27 — End: 2018-03-28
  Administered 2018-03-28: 14:00:00 25 mg via ORAL

## 2018-03-27 MED ORDER — CETIRIZINE HCL 10 MG PO TABS
10 MG | Freq: Every day | ORAL | Status: DC
Start: 2018-03-27 — End: 2018-03-28
  Administered 2018-03-27 – 2018-03-28 (×2): 10 mg via ORAL

## 2018-03-27 MED ORDER — ACETAMINOPHEN 500 MG PO TABS
500 MG | Freq: Once | ORAL | Status: AC
Start: 2018-03-27 — End: 2018-03-27
  Administered 2018-03-27: 13:00:00 1000 mg via ORAL

## 2018-03-27 MED ORDER — POLYETHYLENE GLYCOL 3350 17 G PO PACK
17 g | Freq: Every day | ORAL | Status: DC
Start: 2018-03-27 — End: 2018-03-28
  Administered 2018-03-28: 14:00:00 17 g via ORAL

## 2018-03-27 MED FILL — GLYCOPYRROLATE 0.2 MG/ML IJ SOLN: 0.2 mg/mL | INTRAMUSCULAR | Qty: 1

## 2018-03-27 MED FILL — QUESTRAN 4 G PO PACK: 4 g | ORAL | Qty: 1

## 2018-03-27 MED FILL — OXYCODONE HCL 5 MG PO TABS: 5 mg | ORAL | Qty: 2

## 2018-03-27 MED FILL — MIDAZOLAM HCL 2 MG/2ML IJ SOLN: 2 mg/mL | INTRAMUSCULAR | Qty: 2

## 2018-03-27 MED FILL — DOK 100 MG PO CAPS: 100 mg | ORAL | Qty: 1

## 2018-03-27 MED FILL — THERA-M PO TABS: ORAL | Qty: 1

## 2018-03-27 MED FILL — CEFAZOLIN SODIUM-DEXTROSE 2-4 GM/100ML-% IV SOLN: 2-4 GM/100ML-% | INTRAVENOUS | Qty: 100

## 2018-03-27 MED FILL — HYDROCHLOROTHIAZIDE 25 MG PO TABS: 25 mg | ORAL | Qty: 1

## 2018-03-27 MED FILL — TIZANIDINE HCL 4 MG PO TABS: 4 mg | ORAL | Qty: 1

## 2018-03-27 MED FILL — POLYETHYLENE GLYCOL 3350 17 G PO PACK: 17 g | ORAL | Qty: 1

## 2018-03-27 MED FILL — DEXAMETHASONE 4 MG PO TABS: 4 mg | ORAL | Qty: 1

## 2018-03-27 MED FILL — MAPAP 500 MG PO TABS: 500 mg | ORAL | Qty: 2

## 2018-03-27 MED FILL — VITAMIN D 25 MCG (1000 UT) PO TABS: 25 MCG (1000 UT) | ORAL | Qty: 2

## 2018-03-27 MED FILL — DEXAMETHASONE SODIUM PHOSPHATE 10 MG/ML IJ SOLN: 10 mg/mL | INTRAMUSCULAR | Qty: 1

## 2018-03-27 MED FILL — CETIRIZINE HCL 10 MG PO TABS: 10 mg | ORAL | Qty: 1

## 2018-03-27 MED FILL — LOSARTAN POTASSIUM 25 MG PO TABS: 25 mg | ORAL | Qty: 1

## 2018-03-27 MED FILL — AMITRIPTYLINE HCL 10 MG PO TABS: 10 mg | ORAL | Qty: 3

## 2018-03-27 MED FILL — ULTIVA 1 MG IV SOLR: 1 mg | INTRAVENOUS | Qty: 1000

## 2018-03-27 MED FILL — TOPIRAMATE 100 MG PO TABS: 100 mg | ORAL | Qty: 1

## 2018-03-27 MED FILL — XYLOCAINE/EPINEPHRINE 1 %-1:100000 IJ SOLN: 1 %-:00000 | INTRAMUSCULAR | Qty: 20

## 2018-03-27 MED FILL — AVITENE FLOUR EX POWD: CUTANEOUS | Qty: 1

## 2018-03-27 MED FILL — THROMBIN-JMI 5000 UNITS EX SOLR: 5000 [IU] | CUTANEOUS | Qty: 10000

## 2018-03-27 MED FILL — ALPRAZOLAM 0.25 MG PO TBDP: 0.25 mg | ORAL | Qty: 1

## 2018-03-27 MED FILL — ONDANSETRON HCL 4 MG/2ML IJ SOLN: 4 MG/2ML | INTRAMUSCULAR | Qty: 2

## 2018-03-27 MED FILL — GABAPENTIN 300 MG PO CAPS: 300 mg | ORAL | Qty: 1

## 2018-03-27 MED FILL — PROPOFOL 200 MG/20ML IV EMUL: 200 MG/20ML | INTRAVENOUS | Qty: 40

## 2018-03-27 MED FILL — DEXMEDETOMIDINE HCL 200 MCG/2ML IV SOLN: 200 MCG/2ML | INTRAVENOUS | Qty: 2

## 2018-03-27 MED FILL — LEVOTHYROXINE SODIUM 112 MCG PO TABS: 112 ug | ORAL | Qty: 1

## 2018-03-27 MED FILL — ESMOLOL HCL 100 MG/10ML IV SOLN: 100 MG/10ML | INTRAVENOUS | Qty: 10

## 2018-03-27 MED FILL — ROCURONIUM BROMIDE 50 MG/5ML IV SOLN: 50 MG/5ML | INTRAVENOUS | Qty: 5

## 2018-03-27 MED FILL — BRIDION 200 MG/2ML IV SOLN: 200 MG/2ML | INTRAVENOUS | Qty: 2

## 2018-03-27 MED FILL — CHLORASEPTIC 1.4 % MT LIQD: 1.4 % | OROMUCOSAL | Qty: 20

## 2018-03-27 MED FILL — BUDESONIDE 3 MG PO CPEP: 3 mg | ORAL | Qty: 2

## 2018-03-27 MED FILL — DILAUDID 2 MG/ML IJ SOLN: 2 mg/mL | INTRAMUSCULAR | Qty: 1

## 2018-03-27 MED FILL — SURGIFOAM 100 EX MISC: 100 | CUTANEOUS | Qty: 1

## 2018-03-27 MED FILL — BACITRACIN ZINC 500 UNIT/GM EX OINT: 500 UNIT/GM | CUTANEOUS | Qty: 28

## 2018-03-27 MED FILL — LIDOCAINE HCL (PF) 2 % IJ SOLN: 2 % | INTRAMUSCULAR | Qty: 5

## 2018-03-27 MED FILL — FAMOTIDINE 20 MG PO TABS: 20 mg | ORAL | Qty: 1

## 2018-03-27 MED FILL — MORPHINE SULFATE (PF) 4 MG/ML IV SOLN: 4 mg/mL | INTRAVENOUS | Qty: 1

## 2018-03-27 NOTE — Interval H&P Note (Signed)
No changes  Neuro no change  To OR  Clayborn Bigness

## 2018-03-27 NOTE — Op Note (Signed)
Department of Neurosurgery and  Spine Service  Operative Note          Name: Kelly Miles  MRN: [id number]  Date of Birth:  1960/09/18   Age:  57 y.o.  Primary Care Physician: Jeanett Schlein, MD  Admission Date/Time: 03/27/2018  6:53 AM    Date of Procedure:  03/27/2018     Pre Op Diagnosis:  Meningioma    Post Op Diagnosis:  SAME    Procedure:  Left craniotomy for resection of tumor using Stealth stereotactic navigation, supratentorial    Attending Surgeon: Clayborn Bigness, M.D.    Date of Procedure:  03/27/2018   ??  Pre Op Diagnosis:  Left frontal meningioma  ??  Post Op Diagnosis:  SAME  ??  Procedure:  Left craniotomy for resection of tumor using Stealth stereotactic navigation, supratentorial  ??  Attending Surgeon: Clayborn Bigness, M.D.  ??  ??  ??  ??  Operative Procedure:    The patient was brought to the operating room, and after successful induction of general endotracheal anesthesia, the patient was positioned supine on the OR table with all pressure points adequately padded.  The patient was placed in [place] 3 point head fixation device with the head neutral.  Hair was clipped with a clipper on the left side of the head.  A lazy S  incision was marked out.  Patient was prepped and draped the usual sterile fashion.  Time out was done.  Patient was given preoperative IV antibiotics.  Skin was incised and the dissection was carried down to the bone.  Stealth navigation was used to localize the craniotomy.  The flap was elevated. Several burr holes were placed and a craniotomy flap was turned.  The bone flap was removed. The dura was then opened and a tumor was encountered which invaded the dura.  The tumor was resected along with the attached dura and sent to pathology for frozen and permanent.  This came back consistent with meningioma.   After gross total resection, hemostasis was obtained, duraplasty was performed by placing DuraMatrix over the brain and the bone flap was replaced   using titanium  mini plates and screws. The wound was copiously irrigated with antibiotic irrigation and then the scalp was closed using Vicryl and staples.  Sterile dressings were applied after patient was removed from [place] head holder.  Head wrap was placed.  Patient was then transferred to the Intensive Care Unit.  ??  EBL: 50  ??  Anesthesia:  GETA  ??  Specimens:  Was Obtained: Frozen consistent with meningioma  ??  Findings:  Same as Pre Op  ??  Complications:  NONE              Electronically signed by Clayborn Bigness, MD on 03/27/18 at 10:25 AM

## 2018-03-27 NOTE — Consults (Signed)
Critical Care Consult Note     Admit Date: 03/27/2018    PCP:  Jeanett Schlein, MD    Reason for ICU Consult: ICU management s/p craniotomy     Subjective:     CC: HA, vision changes    HPI: 65yoF admitted to T2 ICU 03/27/18 POD#0 s/p L frontal craniotomy for resection of L frontal meningioma. 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 Initially presented to PCP for headaches and vision changes as well as progressive RUE weakness. Outpatient MRI w/ L parasagittal homogeneously enhancing dural-based mass c/w meningioma vs metastasis. Referred to Neurosurgery for further evaluation, subsequently scheduled for surgery w/ Dr. Christiana Fuchs.       Past Medical History:      Diagnosis Date   ??? Allergic rhinitis    ??? Breast cancer ([place])    ??? Chronic back pain    ??? Colitis    ??? Fibromyalgia    ??? Hypertension    ??? Hypothyroidism    ??? Irritable bowel syndrome    ??? Neuropathy    ??? Osteoarthritis     back   ??? Restless legs syndrome          Past Surgical History:        Procedure Laterality Date   ??? BREAST SURGERY     ??? CHOLECYSTECTOMY     ??? COLONOSCOPY     ??? INNER EAR SURGERY      both sides   ??? TONSILLECTOMY AND ADENOIDECTOMY          Social History:    Social History     Socioeconomic History   ??? Marital status: Married     Spouse name: Not on file   ??? Number of children: Not on file   ??? Years of education: Not on file   ??? Highest education level: Not on file   Occupational History   ??? Not on file   Social Needs   ??? Financial resource strain: Not on file   ??? Food insecurity:     Worry: Not on file     Inability: Not on file   ??? Transportation needs:     Medical: Not on file     Non-medical: Not on file   Tobacco Use   ??? Smoking status: Former Smoker     Types: Cigarettes     Last attempt to quit: 01/25/1978     Years since quitting: 40.1   ??? Smokeless tobacco: Never Used   Substance  and Sexual Activity   ??? Alcohol use: Yes     Frequency: Monthly or less     Comment: Socially   ??? Drug use: No   ??? Sexual activity: Not on file   Lifestyle   ??? Physical activity:     Days per week: Not on file     Minutes per session: Not on file   ??? Stress: Not on file   Relationships   ??? Social connections:     Talks on phone: Not on file     Gets together: Not on file     Attends religious service: Not on file     Active member of club or organization: Not on file     Attends meetings of clubs or organizations: Not on file     Relationship status: Not on file   ??? Intimate partner violence:  Fear of current or ex partner: Not on file     Emotionally abused: Not on file     Physically abused: Not on file     Forced sexual activity: Not on file   Other Topics Concern   ??? Not on file   Social History Narrative   ??? Not on file       Family History:   Family History   Problem Relation Age of Onset   ??? Other Brother         allergies   ??? Brain Cancer Brother    ??? High Blood Pressure Mother    ??? Stroke Mother    ??? Heart Disease Father    ??? High Blood Pressure Sister    ??? Other Sister         allergies   ??? Mult Sclerosis Sister        Allergies:  Adhesive tape; Ciprofloxacin; and Influenza vaccines    MEDICATIONS  Scheduled Meds:  ??? gelatin adsorbable       ??? bacitracin zinc       ??? microfibrillar collagen       ??? lidocaine-EPINEPHrine       ??? thrombin       ??? midazolam       ??? levothyroxine  112 mcg Oral Daily   ??? cetirizine  10 mg Oral Daily   ??? Vitamin D  2,000 Units Oral Daily   ??? tiZANidine  4 mg Oral BID   ??? therapeutic multivitamin-minerals  1 tablet Oral Daily   ??? amitriptyline  30 mg Oral Nightly   ??? [Held by provider] hydrochlorothiazide  25 mg Oral Daily   ??? [START ON 03/28/2018] budesonide  6 mg Oral QAM   ??? cholestyramine  1 packet Oral BID   ??? [Held by provider] losartan  25 mg Oral Daily   ??? topiramate  100 mg Oral BID   ??? potassium chloride  40 mEq Oral Daily   ??? sodium chloride flush  10 mL Intravenous  2 times per day   ??? ceFAZolin (ANCEF) IVPB  2 g Intravenous Q8H   ??? dexamethasone  4 mg Oral 2 times per day   ??? sodium chloride flush  10 mL Intravenous 2 times per day   ??? docusate sodium  100 mg Oral Daily   ??? polyethylene glycol  17 g Oral Daily       PRN meds:  sodium chloride flush, acetaminophen, oxyCODONE **OR** oxyCODONE, morphine **OR** morphine, magnesium hydroxide, ondansetron, hydrALAZINE, sodium chloride flush, magnesium hydroxide, ondansetron, ipratropium-albuterol    Diet: DIET GENERAL;    IV:  ??? sodium chloride 75 mL/hr at 03/27/18 1115         REVIEW OF SYSTEMS:    Review of Systems   Constitutional: Negative for chills and fever.   HENT: Positive for hearing loss (chronic), sore throat and trouble swallowing. Negative for congestion, rhinorrhea and sneezing.    Eyes: Negative for photophobia, redness and visual disturbance.   Respiratory: Negative for cough, chest tightness and shortness of breath.    Cardiovascular: Negative for chest pain and palpitations.   Gastrointestinal: Negative for abdominal pain, constipation, diarrhea, nausea and vomiting.   Endocrine: Negative for polydipsia and polyuria.   Genitourinary: Negative for difficulty urinating, dysuria and frequency.   Musculoskeletal: Negative for arthralgias and myalgias.   Skin: Negative for color change and rash.   Neurological: Positive for dizziness and headaches. Negative for weakness and light-headedness.  Psychiatric/Behavioral: Negative for agitation and confusion.        Objective:       VITALS:  BP 138/84    Pulse 68    Temp 98.2 ??F (36.8 ??C) (Temporal)    Resp 18    Ht 5\' 6"  (1.676 m)    Wt 162 lb (73.5 kg)    SpO2 100%    BMI 26.15 kg/m??   Temp (24hrs), Avg:98.4 ??F (36.9 ??C), Min:98.2 ??F (36.8 ??C), Max:98.6 ??F (37 ??C)    I/O: No intake/output data recorded.      PHYSICAL EXAM:    Physical Exam  Vitals signs reviewed.   Constitutional:       General: She is awake. She is not in acute distress.     Appearance: She is  well-developed. She is not diaphoretic.   HENT:      Head: Normocephalic.      Comments: Bandage covering L crani site, dressing c/d/i     Nose: Nose normal. No congestion.      Mouth/Throat:      Mouth: Mucous membranes are moist.      Pharynx: Oropharynx is clear. No oropharyngeal exudate or posterior oropharyngeal erythema.   Eyes:      General: No scleral icterus.        Right eye: No discharge.         Left eye: No discharge.      Extraocular Movements: Extraocular movements intact.      Conjunctiva/sclera: Conjunctivae normal.      Pupils: Pupils are equal, round, and reactive to light.   Neck:      Musculoskeletal: Neck supple. No neck rigidity.      Thyroid: No thyromegaly.      Trachea: No tracheal deviation.   Cardiovascular:      Rate and Rhythm: Normal rate and regular rhythm.      Pulses: Normal pulses.      Heart sounds: Normal heart sounds. No murmur. No friction rub. No gallop.    Pulmonary:      Effort: Pulmonary effort is normal. No tachypnea, accessory muscle usage or respiratory distress.      Breath sounds: No stridor. No wheezing, rhonchi or rales.   Abdominal:      General: Bowel sounds are decreased. There is no distension.      Palpations: Abdomen is soft. There is no mass.      Tenderness: There is no tenderness. There is no guarding or rebound.   Genitourinary:     Comments: Foley in place, moderate amount clear light yellow urine in bag  Musculoskeletal:         General: Tenderness (chronic RUE) present. No deformity.      Right lower leg: Edema (slight) present.      Left lower leg: Edema (slight) present.   Skin:     General: Skin is warm and dry.      Capillary Refill: Capillary refill takes less than 2 seconds.      Coloration: Skin is not cyanotic or pale.      Findings: No erythema or rash.      Nails: There is no clubbing.     Neurological:      Mental Status: She is alert.      GCS: GCS eye subscore is 4. GCS verbal subscore is 5. GCS motor subscore is 6.      Cranial Nerves: No  dysarthria or facial asymmetry.      Sensory: No  sensory deficit.      Comments: MAEx4, following commands    Psychiatric:         Mood and Affect: Mood normal.         Speech: Speech normal.         Behavior: Behavior is cooperative.           LABS:  BMP:  No results for input(s): NA, K, CL, CO2, BUN, CREATININE, GLUCOSE in the last 72 hours.    Invalid input(s):  PHOS,  MG .  CBC: No results for input(s): WBC, HGB, PLT in the last 72 hours.  Ionized Calcium: No results found for: IONCA   Hepatic:No results for input(s): AST, ALT, ALB, BILITOT, ALKPHOS in the last 72 hours.  Troponin: No results for input(s): TROPONINI in the last 72 hours.  Lactate:  No results found for: LACTA  ABG: No results found for: PHART, PCO2ART, PO2ART  CK: No results for input(s): CKTOTAL in the last 72 hours.  BNP: No results for input(s): BNP in the last 72 hours.  INR: No results for input(s): INR in the last 72 hours.  PT/INR: No results for input(s): PROTIME, INR in the last 72 hours.  APTT: No results for input(s): APTT in the last 72 hours.  CORTISOL: No results for input(s): CORTISOL in the last 72 hours.  TSH: No results for input(s): TSH in the last 72 hours.  PROCALCITONIN: No results for input(s): PROCAL in the last 72 hours.  LIPIDS: No results for input(s): CHOL, HDL in the last 72 hours.    Invalid input(s): LDLCALCU  UA: @LABRCNT (NITRITE,COLORU,PHUR,LABCAST,WBCUA,RBCUA,MUCUS,TRICHOMONAS,YEAST,BACTERIA,CLARITYU,SPECGRAV,LEUKOCYTESUR,UROBILINOGEN,BILIRUBINUR,BLOODU,GLUCOSEU,KETONESU,AMORPHOUS)@     Cultures:       No results for input(s): LABURIN in the last 72 hours.  No results for input(s): BC in the last 72 hours.  @LABRCNT (BLOODCULT2)@  No results for input(s): CULTRESP in the last 72 hours.  No results for input(s): CXCATHTIP in the last 72 hours.  No results for input(s): LEGUR in the last 72 hours.  Invalid input(s): STREPPNEUMAGU  No results for input(s): LABGRAM in the last 72 hours.    RADIOLOGY:  10/18 MRI  Brain:  IMPRESSION: ??   1. Homogeneous intermediate signal intensity and homogeneously    enhancing extra-axial high medial left frontoparietal convexity mass    invaginating the adjacent cortex with effacement of the adjacent sulci    without other mass effect or edema or adjacent osseous destruction    most likely a meningioma.    2. No evidence for other intracranial mass lesion or mass effect or    other areas of abnormal contrast enhancement or other enhancing    lesions.    3. Mild nonspecific nonenhancing white matter hyperintensities most    likely related to mild chronic ischemia and/or small vessel disease.    4. No evidence of other acute or significant intracranial abnormality.      10/18 MRI C-spine:  IMPRESSION:    1. Multilevel degenerative disc disease greatest at C5-C6 and C6-C7.    2. Posterior endplate osteophytes and associated disc protrusion at    C5-C6 with minimal flattening of the ventral aspect of the spinal    cord. Mild right and moderate left neural foraminal narrowing.    3. Midline and right-sided C6-C7 disc protrusion flattening the right    side of the ventral aspect of the thecal sac. At least moderate right    neural foraminal narrowing.    4. No evidence of epidural, spinal canal or paraspinal  mass or other    areas of abnormal spinal cord, spinal canal or paraspinal contrast    enhancement or other enhancing lesions.    5. See above comments for detailed description of findings.          Assessment/Plan       1. POD#0 s/p L frontal craniotomy for resection of meningioma:    - MRI w/ L frontotemporal homogenous dural-based mass c/w meningioma r/o mets    - S/p L frontal craniotomy for mass resection, preliminary frozen path c/w meningioma     - Primary management per Neurosurgery    - Neurovascular checks per protocol, monitor on tele, strict bedrest, PT/OT    - Maintain goal SBP<150 and goal MAP>90, euthermia, euglycemia, euvolemia     - Pain control w/ PRN percocet and morphine      - Continue decadron 4mg  PO q12hrs x4 doses, periop ancef    2. Leukocytosis - likely reactive:    - WBC 12 from 7.9, currently afebrile    - Continue periop ancef    - Monitor daily CBC, trend temp    3. Anemia (nc/nc) likely 2/2 acute blood loss:    - Hgb 11.4 from 12.1, Plt WNL, no s/s active bleeding    - Monitor daily/PRN CBC, consider transfusion for Hgb<7.0 or s/s acute bleed    4. Chronic pain, cervical spondylosis and stenosis, fibromyalgia, neuropathy, restless legs:    - Continue home topamax, zanaflex, and elavil to avoid w/d    - PRN percocet and morphine ordered for acute pain    - Monitor closely for oversedation, may need to decrease dosage of home meds    5. HTN:    - On HCTZ and irbesartan PTA    - Check BMP -- if SCr and K+ WNL will continue    - Continue PRN IV hydralazine     - Maintain goal SBP<150    6. Hypothyroidism:    - Continue home synthroid    7. Collagenous colitis, IBS:    - Continue home PO budesonide and cholestyramine     8. 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 Former tobacco use:    - Quit 01/25/1978    - Add PRN duonebs    10. Debility:    - PT/OT    11. Code status:    - Full code    12. FEN:    - C/o sensation of throat tightness w/ solid foods    - No issues w/ liquids, no increased cough after eating    - Place speech eval, ok to continue diet as tolerated pending     - MIVF w/ NS @ 75cc/hr    - Add bowel regimen -- daily colace + miralax     - Check electrolytes, monitor daily, replace as appropriate      Prophylaxis:  Stress ulcer: []  PPI Agent [x]  H2RA []  Sucralfate []  Other:   VTE: []  Enoxaparin  []  SC Heparin  [x]  SCD    Plan of carediscussed with and reviewed by Dr. Cooper Render.    Electronically signed by Colletta [place], PA-C on 03/27/2018 at 3:12 PM

## 2018-03-28 LAB — CALCIUM, IONIZED
Calcium, Ionized: 4.3 mg/dL (ref 4.30–5.20)
pH, Bld: 7.45 NA (ref 7.31–7.46)

## 2018-03-28 LAB — BASIC METABOLIC PANEL
Anion Gap: 9 NA
BUN: 10 mg/dL (ref 7–20)
CO2: 21 mmol/L — ABNORMAL LOW (ref 22–30)
Calcium: 9 mg/dL (ref 8.4–10.4)
Chloride: 108 mmol/L — ABNORMAL HIGH (ref 98–107)
Creatinine: 0.76 mg/dL (ref 0.52–1.25)
Glucose: 129 mg/dL — ABNORMAL HIGH (ref 70–100)
Potassium: 3.7 mmol/L (ref 3.5–5.1)
Sodium: 139 mmol/L (ref 135–145)
eGFR AA: >60 mL/min (ref >60)
eGFR NON-AA: >60 mL/min (ref >60)

## 2018-03-28 LAB — HEPATIC FUNCTION PANEL
ALT: 23 U/L (ref 13–69)
AST: 19 U/L (ref 15–46)
Albumin,Serum: 3.7 g/dL (ref 3.5–5.0)
Alkaline Phosphatase: 71 U/L (ref 38–126)
Bilirubin, Direct: 0 mg/dL (ref 0.0–0.3)
Total Bilirubin: 0.2 mg/dL (ref 0.2–1.3)
Total Protein: 6.2 g/dL — ABNORMAL LOW (ref 6.3–8.2)

## 2018-03-28 LAB — MANUAL DIFFERENTIAL
Bands: 1 % (ref 0–3)
Basophils %: 2 % (ref 0–2)
Basophils Absolute: 0.3 10*3/uL — ABNORMAL HIGH (ref 0.0–0.2)
Eosinophils Absolute: 0 10*3/uL (ref 0.0–0.5)
Eosinophils: 0 % — ABNORMAL LOW (ref 1–6)
Lymphocytes Absolute: 1.6 10*3/uL (ref 1.1–4.5)
Lymphocytes: 9 % — ABNORMAL LOW (ref 20–40)
Monocytes %: 8 % (ref 2–10)
Monocytes Absolute: 1.4 10*3/uL — ABNORMAL HIGH (ref 0.2–1.1)
Neutrophils Absolute: 14 10*3/uL — ABNORMAL HIGH (ref 2.2–8.2)
RBC Morphology: NORMAL NA
Seg Neutrophils: 80 % (ref 40–80)
Total Cells Counted: 100 NA

## 2018-03-28 LAB — CBC WITH AUTO DIFFERENTIAL
Hematocrit: 31.7 % — ABNORMAL LOW (ref 35.0–47.0)
Hemoglobin: 10.5 g/dL — ABNORMAL LOW (ref 11.7–16.0)
MCH: 31 pg (ref 26.0–34.0)
MCHC: 33.2 % (ref 32.0–36.0)
MCV: 93.1 fL (ref 79.0–98.0)
MPV: 7.4 fL (ref 7.4–10.4)
Platelets: 336 10*3/uL (ref 140–440)
RBC: 3.4 10*6/uL — ABNORMAL LOW (ref 3.80–5.20)
RDW: 13.8 % (ref 11.5–14.5)
WBC: 17.3 10*3/uL — ABNORMAL HIGH (ref 3.6–10.7)

## 2018-03-28 LAB — MAGNESIUM: Magnesium: 1.7 mg/dL (ref 1.6–2.3)

## 2018-03-28 LAB — PHOSPHORUS: Phosphorus: 3.3 mg/dL (ref 2.5–4.5)

## 2018-03-28 MED ORDER — HYDROCODONE-ACETAMINOPHEN 5-325 MG PO TABS
5-325 MG | ORAL_TABLET | Freq: Four times a day (QID) | ORAL | 0 refills | Status: AC | PRN
Start: 2018-03-28 — End: 2018-04-02

## 2018-03-28 MED FILL — MORPHINE SULFATE (PF) 4 MG/ML IV SOLN: 4 mg/mL | INTRAVENOUS | Qty: 1

## 2018-03-28 MED FILL — POLYETHYLENE GLYCOL 3350 17 G PO PACK: 17 g | ORAL | Qty: 1

## 2018-03-28 MED FILL — OXYCODONE HCL 5 MG PO TABS: 5 mg | ORAL | Qty: 1

## 2018-03-28 MED FILL — DOK 100 MG PO CAPS: 100 mg | ORAL | Qty: 1

## 2018-03-28 MED FILL — OXYCODONE HCL 5 MG PO TABS: 5 mg | ORAL | Qty: 2

## 2018-03-28 NOTE — Plan of Care (Signed)
Problem: Pain:  Goal: Pain level will decrease  Description  Pain level will decrease  Outcome: Ongoing  Goal: Control of acute pain  Description  Control of acute pain  Outcome: Ongoing  Goal: Control of chronic pain  Description  Control of chronic pain  Outcome: Ongoing

## 2018-03-28 NOTE — Progress Notes (Signed)
Speech Language Pathology  Facility/Department: [facility] ICU T2   CLINICAL BEDSIDE SWALLOW EVALUATION    NAME: Kelly Miles  DOB: 1960-12-14  MRN: [id number]    ADMISSION DATE: 03/27/2018  ADMITTING DIAGNOSIS: has Breast cancer, stage 2, left ([place]); Cancer ([place]); and Meningioma ([place]) on their problem list.  ONSET DATE: 03/27/2018    Recent Chest Xray/CT of Chest:   None this admission    Date of Eval: 03/28/2018  Evaluating Therapist: Ansel Bong    Current Diet level:  Current Diet : Regular  Current Liquid Diet : Thin      Primary Complaint  Patient Complaint: None    Pain:  None reported    Reason for Referral: s/p L frontal craniotomy for resection of meningioma:     C/o sensation of throat tightness w/ solid foods    - No issues w/ liquids, no increased cough after eating    ED NOTES:     CC: HA, vision changes     HPI: 57yoF admitted to T2 ICU 03/27/18 POD#0 s/p L frontal craniotomy for resection of L frontal meningioma. 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 Initially presented to PCP for headaches and vision changes as well as progressive RUE weakness. Outpatient MRI w/ L parasagittal homogeneously enhancing dural-based mass c/w meningioma vs metastasis. Referred to Neurosurgery for further evaluation, subsequently scheduled for surgery w/ Dr. Christiana Fuchs.            Mellody Dance was referred for a bedside swallow evaluation to assess the efficiency of her swallow function, identify signs and symptoms of aspiration and make recommendations regarding safe dietary consistencies, effective compensatory strategies, and safe eating environment.    Impression  Dysphagia Impression : Patient presents with a normal oropharyngeal swallow pattern as assessed clinically.  Recommend a Regular diet.  No formal dysphagia intervention is indicated.  Will proceed with a  Cognitive Evaluation next session.        Treatment Plan  Requires SLP Intervention: No  Duration/Frequency of Treatment: na          Recommended Diet and Intervention  Diet Solids Recommendation: Regular  Liquid Consistency Recommendation: Thin  Recommended Form of Meds: PO          General  Chart Reviewed: Yes  Behavior/Cognition: Alert;Cooperative  O2 Device: None (Room air)  Communication Observation: Functional  Follows Directions: Complex  Dentition: Adequate  Patient Positioning: Upright in bed  Baseline Vocal Quality: Normal  Volitional Cough: Strong  Prior Dysphagia History: No prior MBSS in EPIC.  Patient no longer reporting post-operative changes with swallowing.  Consistencies Administered: Lavetta Nielsen;Thin - straw;Dysphagia Pureed (Dysphagia I);Reg solid         Oral Motor Deficits  Oral/Motor  Oral Motor: Within functional limits    Oral Phase Dysfunction  Oral Phase  Oral Phase: WNL     Indicators of Pharyngeal Phase Dysfunction   Pharyngeal Phase  Pharyngeal Phase: WNL      Education  Patient Education: Spoke with the patient and the RN.  Safety Devices in place: Yes          Therapy Time  SLP Individual Minutes  Time In: 0815  Time Out: 0830  Minutes: 15     SLP Total Treatment Time  Total Treatment Time: [address] MS, CCC/ SLP  03/28/2018 10:11 AM

## 2018-03-28 NOTE — Anesthesia Post-Procedure Evaluation (Signed)
Department of Anesthesiology  Post  Anesthestic Note      Name: Kelly Miles MRN: [id number] DOB: 1960/08/23    (Age-57 y.o.)     BP 138/84    Pulse 91    Temp 99.4 ??F (37.4 ??C)    Resp 14    Ht 5\' 6"  (1.676 m)    Wt 166 lb 3.6 oz (75.4 kg)    SpO2 97%    BMI 26.83 kg/m??     Anesthesia type: General    Vital signs: BP 138/84    Pulse 91    Temp 99.4 ??F (37.4 ??C)    Resp 14    Ht 5\' 6"  (1.676 m)    Wt 166 lb 3.6 oz (75.4 kg)    SpO2 97%    BMI 26.83 kg/m??     Level of consciousness alert and oriented      Respiratory status: Stable on room air Well hydrated: Hemodynamic management per ICU team   Pain controlled: Yes Complications: None noted   Nausea/Vomiting: None        Patient Experience Comments: None expressed       Electronically signed by Sid Falcon, APRN - CRNA on 03/28/2018 at 9:00 AM

## 2018-03-28 NOTE — Care Coordination-Inpatient (Addendum)
Pt admitted to T2 ICU s/p scheduled Left Craniotomy for Resection of Tumor 2/2 Left Frontal Meningioma.  Introduced self and role to patient, pts husband, son and dtr in law @ bedside; provided patient with discharge checklist, reviewed its purpose and explained there will be further discussion during patient's stay.  Pt from home with husband, is independent prior to admit.  On diet, doing well.  Therapy rec home with assist; husband & family available to assist pt as needed.  Pt states she is being d/c home today, agreeable to Meds to Bed Program, called and notified them of d/c today.  Electronically signed by Theodoro Doing, RN on 03/28/2018 at 11:49 AM

## 2018-03-28 NOTE — Other (Unsigned)
Patient Acct Nbr: [id number]   Primary AUTH/CERT:   Rarden Name: Pooler name: [facility]  Primary Insurance Group Number: [id number]  Primary Insurance Plan Type: Health  Primary Insurance Policy Number: [id number]

## 2018-03-28 NOTE — Progress Notes (Signed)
Doing well no c/o  No neuro deficits  Mae normalyy cranial nerves intact  conversive oriented follows command  Incision good    Will dc to home  Remove art line  No driving  Shower daily  No strenuous activity  F/u with borsellino in 1-2 weeks

## 2018-03-28 NOTE — Discharge Instructions (Signed)
No strenuous activity , no driving, shower daily

## 2018-03-28 NOTE — Progress Notes (Signed)
Physical Therapy    Facility/Department: [facility] ICU T2  Initial Assessment    NAME: Kelly Miles  DOB: 1960/11/26  MRN: [id number]    Date of Service: 03/28/2018    Discharge Recommendations:  (home with assist)        Assessment   Body structures, Functions, Activity limitations: Decreased functional mobility ;Decreased ADL status;Decreased strength;Decreased balance;Increased pain  Assessment: pt admitted with meningioma s/p craniotomy.  pt modif indep for bed mobility, indep for transfers, mild unsteady/increased sway with initial standing.  supervision for ambulation - short shuffle steps.  recommend home upon disch with family assist.  per pt family available 24/7 and able to help.   Decision Making: Low Complexity  REQUIRES PT FOLLOW UP: Yes       Patient Diagnosis(es): There were no encounter diagnoses.     has a past medical history of Allergic rhinitis, Breast cancer ([place]), Chronic back pain, Colitis, Fibromyalgia, Hypertension, Hypothyroidism, Irritable bowel syndrome, Neuropathy, Osteoarthritis, and Restless legs syndrome.   has a past surgical history that includes Cholecystectomy; Tonsillectomy and adenoidectomy; Breast surgery; Inner ear surgery; and Colonoscopy.    Restrictions  Restrictions/Precautions  Restrictions/Precautions: Fall Risk  Required Braces or Orthoses?: No  Position Activity Restriction  Other position/activity restrictions: IV, art line, telemetry - RN okayed off     Subjective  General  Chart Reviewed: Yes  Patient assessed for rehabilitation services?: Yes  Family / Caregiver Present: No  Diagnosis: pt admitted with meningioma s/p L high parietal craniotomy  Follows Commands: Within Functional Limits  Subjective  Subjective: pt in bed.  agreeable to PT.   Pain Screening  Patient Currently in Pain: Yes(R arm)       Orientation  Orientation  Orientation Level: Oriented to situation;Oriented to person  Social/Functional History  Social/Functional History  Lives With: Spouse  Type of  Home: House  Home Layout: Two level  Home Access: Stairs to enter with rails  ADL Assistance: Independent  Homemaking Assistance: Independent  Ambulation Assistance: Independent  Transfer Assistance: Independent  Additional Comments: husband around 24/7 and can assist as needed, has other family that is planning on helping as well.     Objective    AROM RLE (degrees)  RLE AROM: WFL  AROM LLE (degrees)  LLE AROM : WFL  Strength Other  Other: observed at least 3/5 bilat LEs through functional mobility  Motor Control  Gross Motor?: WFL     Bed mobility  Supine to Sit: Modified independent(HOB elevated)  Scooting: Independent  Transfers  Sit to Stand: Independent  Stand to sit: Independent  Ambulation  Ambulation?: Yes  WB Status: no restrictions  Ambulation 1  Surface: level tile  Device: No Device  Assistance: Supervision;Stand by assistance  Quality of Gait: short shuffle steps  Gait Deviations: Slow Cadence;Decreased step length  Distance: 58ft     Balance  Sitting - Static: Good  Standing - Static: Good  Standing - Dynamic: Good;-  Comments: no UE support needed for ambulation and standing, mild increased sway on initial standing and short shuffle steps while ambulating.         Plan   Plan  Times per week: 5x  Plan weeks: 2 weeks  Current Treatment Recommendations: Strengthening, ADL/Self-care Training, Personnel officer, Hotel manager, Therapist, nutritional, Training and development officer, IT trainer, Proofreader, Home Exercise Program, Barrister's clerk, Education, Visual merchandiser, Patient/Caregiver Education & Training  Safety Devices  Type of devices: Patient at risk for falls, Left in chair, Nurse notified,  Call light within reach    AM-PAC Score  AM-PAC Inpatient Mobility Raw Score : 22 (03/28/18 0918)  AM-PAC Inpatient T-Scale Score : 53.28 (03/28/18 6734)  Mobility Inpatient CMS 0-100% Score: 20.91 (03/28/18 1937)  Mobility Inpatient CMS G-Code Modifier : CJ (03/28/18 9024)           Goals  Short term goals  Time Frame for Short term goals: 2 weeks  Short term goal 1: indep supine <-> sit  Short term goal 2: indep sit <-> stand  Short term goal 3: indep ambulate 125ft   Short term goal 4: ascend/descend 7 steps modif indep  Patient Goals   Patient goals : to go home (today)       Therapy Time   Individual Concurrent Group Co-treatment   Time In 0831         Time Out 0842         Minutes 11            Patient's Physical Therapy Plan of Care supervision is transferred to [facility] Department Physical Therapist.   Goals and/or treatment plan was established in collaboration with patient/family/other representatives.    Synthia Innocent PT, DPT

## 2018-03-28 NOTE — Progress Notes (Signed)
CRITICAL CARE DAILY PROGRESS NOTE      Admit Date: 03/27/2018  PCP: Jeanett Schlein, MD    Subjective     Patient admitted 03-27-18 after being evaluated for headaches with blurred vision, balance trouble, and RUE weakness. An OP MRI revealed a left frontoparietal convexity mass most likely associated with a meningioma. She was evaluated by neurosurgery and underwent a left craniotomy for resection of the tumor on 03-27-18. She remains in the ICU for further management.     EVENTS OF LAST 24 HOURS: Patient awake and alert, sitting up in the chair. She complains of a mild headache, and RUE weakness that is unchanged. She denies vision changes, dizziness, CP, SOB, or abdominal pain.          Recent Labs     03/27/18  1446 03/28/18  0252   PH 7.37 7.45     IV:  ??? lactated ringers 75 mL/hr at 03/27/18 1653       MEDICATIONS  Scheduled Meds:  ??? levothyroxine  112 mcg Oral Daily   ??? cetirizine  10 mg Oral Daily   ??? Vitamin D  2,000 Units Oral Daily   ??? tiZANidine  4 mg Oral BID   ??? therapeutic multivitamin-minerals  1 tablet Oral Daily   ??? amitriptyline  30 mg Oral Nightly   ??? [Held by provider] hydrochlorothiazide  25 mg Oral Daily   ??? budesonide  6 mg Oral QAM   ??? cholestyramine  1 packet Oral BID   ??? losartan  25 mg Oral Daily   ??? topiramate  100 mg Oral BID   ??? [Held by provider] potassium chloride  40 mEq Oral Daily   ??? sodium chloride flush  10 mL Intravenous 2 times per day   ??? dexamethasone  4 mg Oral 2 times per day   ??? sodium chloride flush  10 mL Intravenous 2 times per day   ??? docusate sodium  100 mg Oral Daily   ??? polyethylene glycol  17 g Oral Daily       PRN meds:  sodium chloride flush, acetaminophen, oxyCODONE **OR** oxyCODONE, morphine **OR** morphine, magnesium hydroxide, ondansetron, hydrALAZINE, sodium chloride flush, magnesium hydroxide, ondansetron, ipratropium-albuterol, phenol    Objective     PHYSICALEXAM:    VITALS:    BP 138/84    Pulse 81    Temp 99.4 ??F (37.4 ??C)    Resp 15    Ht 5\' 6"   (1.676 m)    Wt 166 lb 3.6 oz (75.4 kg)    SpO2 98%    BMI 26.83 kg/m??     24HRINTAKE/OUTPUT:      Intake/Output Summary (Last 24 hours) at 03/28/2018 1013  Last data filed at 03/28/2018 0700  Gross per 24 hour   Intake 1458.75 ml   Output 375 ml   Net 1083.75 ml       CURRENT PULSE OXIMETRY:  SpO2: 98 %  24HR PULSE OXIMETRY RANGE:  SpO2  Avg: 97.7 %  Min: 94 %  Max: 100 %    Physical Exam  Vitals signs reviewed.   Constitutional:       General: She is not in acute distress.     Appearance: She is not ill-appearing.      Comments: Sitting up in bed, awake and alert    HENT:      Head:      Comments: Left sided craniotomy wound clean, dry with no purulent discharge  Nose: Nose normal.   Eyes:      General: No scleral icterus.        Right eye: No discharge.         Left eye: No discharge.      Conjunctiva/sclera: Conjunctivae normal.      Pupils: Pupils are equal, round, and reactive to light.   Cardiovascular:      Rate and Rhythm: Normal rate.      Heart sounds: No friction rub. No gallop.       Comments: Bilateral LE edema noted, DP pulses 2+ bilaterally   Pulmonary:      Effort: No respiratory distress.      Breath sounds: No stridor. No wheezing.      Comments: No accessory muscle use, unlabored on room air   Abdominal:      General: Bowel sounds are normal. There is no distension.      Palpations: Abdomen is soft. There is no mass.      Comments: No obvious palpable hernias or HSM    Musculoskeletal:      Comments: No cyanosis, no clubbing, cap refill 2-3 seconds bilateral UE    Skin:     General: Skin is warm and dry.      Coloration: Skin is not jaundiced or pale.      Comments: No induration    Neurological:      Mental Status: She is alert.      Comments: RUE muscle strength 4/5, LUE muscle strength 5/5, grip strength decreased on right    Psychiatric:         Mood and Affect: Mood normal.         Behavior: Behavior normal.      Comments: A&O x3       LABS:  CBC:   Recent Labs     03/27/18  1446  03/28/18  0252   WBC 12.0* 17.3*   HGB 11.4* 10.5*   HCT 34.8* 31.7*   MCV 91.9 93.1   PLT 392 336     BMP:   Recent Labs     03/27/18  1446 03/28/18  0252   NA 137 139   K 4.2 3.7   CL 109* 108*   CO2 21* 21*   PHOS 3.6 3.3   BUN 12 10   CREATININE 0.78 0.76   MG 1.8 1.7     LIVER PROFILE:   Recent Labs     03/27/18  1446 03/28/18  0252   AST 34 19   ALT 25 23   BILIDIR 0.0 0.0   BILITOT 0.2 0.2   ALKPHOS 79 71       RADIOLOGY:    Reason For Exam    brain mass    ??   ??   Report    ??   Clinical indication: ??Brain mass    ??   Comparison: ??MRI 02/24/2018    ??   Radiation dose: ??DLP 1237 mGycm    ??   Imaging between skull base and vertex was performed without    intravenous contrast. ??    ??   Since the MRI the patient has had a high left parietal craniotomy.    Pneumocephalus is noted which appears limited to the immediate    extra-axial space at the level the craniotomy. The mass identified on    the prior MRI is not visible consistent with interval excision. No    intraparenchymal hemorrhage is noted. There  is no significant extra    axial hemorrhagic collection.    ??   There is mild sulcal effacement in the immediate vicinity of the    surgery. Otherwise ventricles, sulci and cisterns appear normal.    ??   IMPRESSION:    ??   Left high parietal craniotomy with previous mass no longer identified    ??       ASSESSMENT AND PLAN:    Patient Active Problem List:     Breast cancer, stage 2, left ([place])     Cancer ([place])     Meningioma ([place])        1. Left frontoparietal tumor most likely meningioma s/p left craniotomy for resection- doing well, CT head this morning showing left high parietal craniotomy with previous mass no longer identified, neurosurgery primary and ok to discharge home with follow up in 1-2 weeks per chart     2. Leukocytosis likely reactive to #1/ steroids- WBC count 17.3, afebrile, completed post-op prophylactic Abx, does not appear infectious/ toxic     3. Chronic back pain/ fibromyalgia/ neuropathy/  RLS- continue home meds topamax, zanaflex elavil    4. HTN- BP controlled 138/ 84, HCTZ/ cozaar    5. Thyroid disease- synthroid     6. Anemia- Hgb 10.5, no active signs of bleeding, monitor H&H    7. Hx breast CA- s/p lumpectomy, XRT, tamoxifen     8. Colitis/ IBS- continue cholestyramine     9. FEN- evaluated by SLP- regular diet, replace electrolytes PRN    10. DVT/ GI prophylaxis- SCDs, no GI prophylaxis indicated at this time       Dispo: Per neurosurgery ok to discharge home with follow up in 1-2 weeks     Full Code    Plan discussed and reviewed with Dr. Cooper Render    Electronically signed by Barnett Applebaum, PA-C on 03/28/2018 at 10:13 AM

## 2018-03-28 NOTE — Discharge Summary (Signed)
Hospitalist Discharge Summary    Kelly Miles  DOB:  1960/12/12  MRN:  [id number]    Admit date:  03/27/2018  Discharge date:  03/28/2018    Admitting Physician:  Kelly Bigness, MD  Primary Care Physician:  Kelly Schlein, MD    Discharge Diagnoses:    1. Left frontal meningioma     Hospital Course:   ??  Patient admitted 03-27-18 after being evaluated for headaches with blurred vision, balance trouble, and RUE weakness. An OP MRI revealed a left frontoparietal convexity mass most likely associated with a meningioma. She was evaluated by neurosurgery and underwent a left craniotomy for resection of the tumor on 03-27-18. She was admitted to the ICU post-op for further management.  Current vital signs are stable. Please see progress note on day of discharge for detailed physical exam findings.    Discharge Medications:       Kelly Miles   Home Medication Instructions CZY:[id number]    Printed on:03/28/18 1156   Medication Information                      amitriptyline (ELAVIL) 10 MG tablet  Take 30 mg by mouth nightly              aspirin 81 MG chewable tablet  Take 81 mg by mouth daily Patient stopped per Dr. Christiana Miles 10 days prior to surgery             budesonide (ENTOCORT EC) 3 MG extended release capsule  Take 6 mg by mouth every morning             cetirizine (ZYRTEC) 10 MG tablet  Take 10 mg by mouth daily              cholestyramine (QUESTRAN) 4 g packet  Take 1 packet by mouth 2 times daily              DIPHENOXYLATE-ATROPINE PO  Take 2.5 mg by mouth 4 times daily For Diarrhea/Colitis             hydrochlorothiazide (HYDRODIURIL) 25 MG tablet  Take 25 mg by mouth daily             irbesartan (AVAPRO) 300 MG tablet  Take 300 mg by mouth daily              LORazepam (ATIVAN) 1 MG tablet  Take 1 mg by mouth daily.             Menthol, Topical Analgesic, (BIOFREEZE COLORLESS EX)  Apply topically as needed              Multiple Vitamins-Iron (TAB-A-VITE/IRON) TABS  Take 1 tablet by mouth daily               potassium chloride (KLOR-CON M) 20 MEQ extended release tablet  Take 2 tablets by mouth daily for 5 days             SYNTHROID 125 MCG tablet  Take 112 mcg by mouth daily              tiZANidine (ZANAFLEX) 4 MG tablet  Take 4 mg by mouth 2 times daily              topiramate (TOPAMAX) 100 MG tablet  Take 100 mg by mouth 2 times daily             vitamin D (CHOLECALCIFEROL) 1000 UNIT TABS tablet  Take  2,000 Units by mouth daily                  Consults:  Critical Care for post-op medical management     Significant Diagnostic Studies:    Reason For Exam    Malignant (primary) neoplasm, unspecified    ??   ??   Report    ??   CLINICAL INFORMATION: ?? Headache, dizziness and blurred vision.    History of breast cancer.    ??   MRI brain without and with gadolinium:    ??   Contrast: MultiHance, 15 mL.    ??   Sagittal T1, coronal T1 and turbo spin-echo T2 and axial T1, blood    sensitive, turbo spin-echo T2, FLAIR and diffusion weighted images are    obtained prior to gadolinium administration. Axial , coronal and    sagittal T1-weighted images are obtained following gadolinium    administration.    ??   There is a well-defined homogeneous T1 and T2 intermediate signal    intensity to 0.7 x 2.6 x 2.3 cm (transverse, AP, CC) extra-axial mass    related to the high medial left frontal parietal convexity    demonstrating homogeneous contrast enhancement which invaginates the    adjacent cortex with effacement of the sulci without other mass effect    or associated edema. There appear to be small medial and prominent    lateral "dural tails" most consistent with a meningioma. There is no    destruction or other involvement of the overlying calvarium.    ??   The ventricles and sulci are slightly prominent for age but otherwise    unremarkable in appearance. ??No intra-axial mass lesion or mass-effect    is seen. There are mild nonenhancing scattered foci of hyperintensity    in the deep and periventricular white matter  which are nonspecific but    most likely related to mild chronic ischemia and/or small vessel    disease. No other focal areas of abnormal intra-axial signal intensity    are identified. There is no abnormality of the cerebellum, midbrain,    pons or medulla. The corpus callosum, optic chiasm and pituitary are    unremarkable in appearance. There is no evidence of acute ischemic    disease. There are no other areas of abnormal contrast enhancement or    other enhancing lesions.    ??   There is also thickening and opacification of several bilateral    ??      ??   ??   ??   ??   ethmoid air cells. The mastoid air cells and other paranasal sinuses    are clear.    ??   IMPRESSION: ??   1. Homogeneous intermediate signal intensity and homogeneously    enhancing extra-axial high medial left frontoparietal convexity mass    invaginating the adjacent cortex with effacement of the adjacent sulci    without other mass effect or edema or adjacent osseous destruction    most likely a meningioma.    2. No evidence for other intracranial mass lesion or mass effect or    other areas of abnormal contrast enhancement or other enhancing    lesions.    3. Mild nonspecific nonenhancing white matter hyperintensities most    likely related to mild chronic ischemia and/or small vessel disease.    4. No evidence of other acute or significant intracranial abnormality.    ??     Reason  For Exam    brain mass    ??   ??   Report    ??   Clinical indication: ??Brain mass    ??   Comparison: ??MRI 02/24/2018    ??   Radiation dose: ??DLP 1237 mGycm    ??   Imaging between skull base and vertex was performed without    intravenous contrast. ??    ??   Since the MRI the patient has had a high left parietal craniotomy.    Pneumocephalus is noted which appears limited to the immediate    extra-axial space at the level the craniotomy. The mass identified on    the prior MRI is not visible consistent with interval excision. No    intraparenchymal hemorrhage is noted.  There is no significant extra    axial hemorrhagic collection.    ??   There is mild sulcal effacement in the immediate vicinity of the    surgery. Otherwise ventricles, sulci and cisterns appear normal.    ??   IMPRESSION:    ??   Left high parietal craniotomy with previous mass no longer identified        Disposition:   Patient discharged in stable condition. Greater than 30 minutes spent discharging the patient and coming up with patient discharge plan.      Follow up with Dr. Christiana Miles in 1-2 weeks.    Signed:  Janina Mayo Sharad Vaneaton, PA-C  03/28/2018, 11:56 AM

## 2018-04-03 ENCOUNTER — Encounter: Payer: Self-pay | Admitting: Nurse Practitioner

## 2018-04-03 ENCOUNTER — Ambulatory Visit (INDEPENDENT_AMBULATORY_CARE_PROVIDER_SITE_OTHER): Payer: BC Managed Care – PPO | Admitting: Nurse Practitioner

## 2018-04-03 VITALS — BP 154/68 | HR 84 | Resp 16 | Ht 64.0 in | Wt 161.4 lb

## 2018-04-03 DIAGNOSIS — F5101 Primary insomnia: Secondary | ICD-10-CM | POA: Insufficient documentation

## 2018-04-03 DIAGNOSIS — R635 Abnormal weight gain: Secondary | ICD-10-CM

## 2018-04-03 DIAGNOSIS — R300 Dysuria: Secondary | ICD-10-CM

## 2018-04-03 DIAGNOSIS — I10 Essential (primary) hypertension: Secondary | ICD-10-CM

## 2018-04-03 DIAGNOSIS — E1165 Type 2 diabetes mellitus with hyperglycemia: Secondary | ICD-10-CM

## 2018-04-03 DIAGNOSIS — Z0001 Encounter for general adult medical examination with abnormal findings: Secondary | ICD-10-CM

## 2018-04-03 DIAGNOSIS — Z1211 Encounter for screening for malignant neoplasm of colon: Secondary | ICD-10-CM

## 2018-04-03 LAB — POCT GLYCOSYLATED HEMOGLOBIN (HGB A1C): Hemoglobin A1C: 8.2 % — AB (ref 4.0–5.6)

## 2018-04-03 MED ORDER — ZOLPIDEM TARTRATE 10 MG PO TABS: 10.0000 mg | ORAL_TABLET | Freq: Every evening | ORAL | 2 refills | Status: DC | PRN

## 2018-04-03 MED ORDER — PHENTERMINE HCL 37.5 MG PO TABS: 37.5000 mg | ORAL_TABLET | Freq: Every day | ORAL | 2 refills | Status: DC

## 2018-04-03 NOTE — Progress Notes (Signed)
[facility] [place], [place] [address]  Internal MEDICINE  Office Visit Note  Patient Name: Chelsea Rivera  [id number]  [id number]  Date of Service: 04/03/2018  Chief Complaint  Patient presents with  . Annual Exam    3 month well visit. pt has changed dosage of insulin  . Hyperlipidemia  . Hypertension  . Diabetes  . Quality Metric Gaps    pt will get flu vaccine at pharmacy,     Patient arrives for her routine annual wellness check up. Patient reports she feels well today, but feel stressed due to work. She occasionally have seasonal allergies, but takes Zyrtec that works well for her. She denies any chest pain and shortness of breath. She started taking phentermine for weight management and lost 10 pounds from the last visit. Her current A1C is 8.2 from 9.2 from 4 months ago. Patient reports she reduced her carbohydrates intake and eats less bread. She does not have for regular exercise, but stays active during her work time. Her blood pressure is elevated today. Patient report she under lots of stress. Plan to recheck BP next visit and adjust medications as needed. Patient does not have any other questions or concerns.  Pt is here for routine health maintenance examination  Current Medication: Outpatient Encounter Medications as of 04/03/2018  Medication Sig Note  . allopurinol (ZYLOPRIM) 100 MG tablet Take by mouth. 05/14/2016: Received from: [place]: Take 100 mg by mouth.  Marland Kitchen aspirin EC 81 MG tablet Take 81 mg by mouth daily.   Marland Kitchen atorvastatin (LIPITOR) 10 MG tablet Take 1/2 tab po  Daily for high cholesterol   . bisoprolol (ZEBETA) 5 MG tablet Take 1 tablet (5 mg total) by mouth at bedtime.   . conjugated estrogens (PREMARIN) vaginal cream Place vaginally. 05/14/2016: Received from: [place]: Place vaginally.  . Dulaglutide (TRULICITY) 1.5 SW/9.6PR SOPN INJECT 1.5MG  ONCE A WEEK   .  furosemide (LASIX) 20 MG tablet Take 1 tablet (20 mg total) by mouth daily as needed.   . Insulin Detemir (LEVEMIR FLEXTOUCH) 100 UNIT/ML Pen 30 units at bedtime   . LINZESS 145 MCG CAPS capsule Take 1 capsule (145 mcg total) by mouth daily.   Marland Kitchen lisinopril (PRINIVIL,ZESTRIL) 2.5 MG tablet TAKE ONE TAB EVERY DAY   . metFORMIN (GLUCOPHAGE) 1000 MG tablet TAKE 1 TABLET BY MOUTH TWICE DAILY   . metFORMIN (GLUCOPHAGE-XR) 750 MG 24 hr tablet Take by mouth. 05/14/2016: Received from: [place]: Take 750 mg by mouth 2 (two) times daily.  Marland Kitchen omeprazole (PRILOSEC) 40 MG capsule Take 1 capsule (40 mg total) by mouth daily.   . ondansetron (ZOFRAN) 8 MG tablet Take 1 tab po twice a daily as needed for nausea   . ONE TOUCH ULTRA TEST test strip    . phentermine (ADIPEX-P) 37.5 MG tablet Take 1 tablet (37.5 mg total) by mouth daily before breakfast.   . rifaximin (XIFAXAN) 550 MG TABS tablet Take 1 tablet (550 mg total) by mouth 2 (two) times daily.   Marland Kitchen zolpidem (AMBIEN) 10 MG tablet Take 1 tablet (10 mg total) by mouth at bedtime as needed for sleep.   . [DISCONTINUED] phentermine (ADIPEX-P) 37.5 MG tablet Take 1 tablet (37.5 mg total) by mouth daily before breakfast.   . [DISCONTINUED] zolpidem (AMBIEN) 10 MG tablet Take 1 tablet (10 mg total) by mouth at bedtime as needed for sleep.   . [  DISCONTINUED] ciprofloxacin (CIPRO) 250 MG tablet Take 1 tablet (250 mg total) by mouth 2 (two) times daily. (Patient not taking: Reported on 06/10/2017)    No facility-administered encounter medications on file as of 04/03/2018.     Surgical History: Past Surgical History:  Procedure Laterality Date  . BREAST BIOPSY Right 2012   cleaned out fistula, not a biopsy    Medical History: Past Medical History:  Diagnosis Date  . Diabetes mellitus without complication ([place])   . GERD (gastroesophageal reflux disease)   . Hyperlipidemia   . Hypertension     Family History: Family History   Problem Relation Age of Onset  . Lung cancer Mother   . Colon cancer Father   . Breast cancer Neg Hx       Review of Systems  Constitutional: Negative for activity change, appetite change, chills, fatigue, fever and unexpected weight change.       10 pound weight loss since last visit  HENT: Negative for congestion, ear pain, facial swelling, hearing loss, postnasal drip, rhinorrhea, sinus pressure, sinus pain, sore throat, trouble swallowing and voice change.   Eyes: Negative for photophobia, pain and visual disturbance.  Respiratory: Negative for apnea, cough, chest tightness, shortness of breath, wheezing and stridor.   Cardiovascular: Negative for chest pain, palpitations and leg swelling.  Gastrointestinal: Negative for abdominal distention, abdominal pain, constipation, diarrhea, nausea, rectal pain and vomiting.  Endocrine: Negative for cold intolerance, heat intolerance, polydipsia, polyphagia and polyuria.       Improved blood sugars  Genitourinary: Negative for decreased urine volume, dysuria, flank pain, frequency, genital sores, hematuria, pelvic pain, urgency, vaginal discharge and vaginal pain.  Musculoskeletal: Negative.   Skin: Negative for color change, pallor, rash and wound.  Allergic/Immunologic: Positive for environmental allergies.       Takes Zyrtec as needed  Neurological: Negative for dizziness, tremors, syncope, facial asymmetry, speech difficulty, weakness, light-headedness, numbness and headaches.  Hematological: Negative for adenopathy. Does not bruise/bleed easily.  Psychiatric/Behavioral: Negative for agitation and dysphoric mood. The patient is nervous/anxious.      Today's Vitals   04/03/18 0918  BP: (!) 154/68  Pulse: 84  Resp: 16  SpO2: 98%  Weight: 161 lb 6.4 oz (73.2 kg)  Height: 5\' 4"  (1.626 m)    Physical Exam  Constitutional: She appears well-developed and well-nourished. No distress.  HENT:  Head: Normocephalic and atraumatic.   Right Ear: External ear normal.  Left Ear: External ear normal.  Nose: Nose normal. Right sinus exhibits no maxillary sinus tenderness and no frontal sinus tenderness. Left sinus exhibits no maxillary sinus tenderness and no frontal sinus tenderness.  Mouth/Throat: Oropharynx is clear and moist. No oropharyngeal exudate.  Eyes: Pupils are equal, round, and reactive to light. Conjunctivae and EOM are normal. Right eye exhibits no discharge. Left eye exhibits no discharge. No scleral icterus.  Neck: Normal range of motion. Neck supple. No JVD present. Carotid bruit is not present. No tracheal deviation present. No thyromegaly present.  Cardiovascular: Normal rate, regular rhythm, normal heart sounds and intact distal pulses. Exam reveals no gallop and no friction rub.  No murmur heard. Pulses:      Dorsalis pedis pulses are 2+ on the right side, and 2+ on the left side.       Posterior tibial pulses are 2+ on the right side, and 2+ on the left side.  Pulmonary/Chest: Effort normal and breath sounds normal. No respiratory distress. She has no wheezes. She has no rales. She exhibits  no mass, no tenderness, no edema, no deformity, no swelling and no retraction. Right breast exhibits no inverted nipple, no mass, no nipple discharge, no skin change and no tenderness. Left breast exhibits no inverted nipple, no mass, no nipple discharge, no skin change and no tenderness. No breast swelling, tenderness, discharge or bleeding. Breasts are symmetrical.  Abdominal: Soft. Bowel sounds are normal. She exhibits no distension. There is no tenderness. There is no guarding.  Musculoskeletal: Normal range of motion. She exhibits no edema, tenderness or deformity.       Right foot: There is normal range of motion and no deformity.       Left foot: There is normal range of motion and no deformity.  Feet:  Right Foot:  Protective Sensation: 10 sites tested. 10 sites sensed.  Skin Integrity: Negative for ulcer,  blister, skin breakdown, erythema, warmth, callus or dry skin.  Left Foot:  Protective Sensation: 10 sites tested. 10 sites sensed.  Skin Integrity: Negative for ulcer, blister, skin breakdown, erythema, warmth, callus or dry skin.  Lymphadenopathy:    She has no cervical adenopathy.  Neurological: She is alert.  Skin: Skin is warm and dry. Capillary refill takes less than 2 seconds. No rash noted. She is not diaphoretic. No erythema.  Psychiatric: She has a normal mood and affect. Her behavior is normal. Judgment and thought content normal.  Nursing note and vitals reviewed.   Assessment/Plan: 1. Encounter for general adult medical examination with abnormal findings Annual health maintenance exam today  2. Uncontrolled type 2 diabetes mellitus with hyperglycemia (HCC) - POCT HgB A1C 8.2, down from 9.2 at her last visit. Will continue to make diet improvements and incorporate exercise into daily routine when possible.   3. Benign hypertension Stable. Continue bp medication as prescribed   4. Abnormal weight gain Improving. Lost 10 pounds since starting phentermine. No negative side effects to report. May continue. Discussed importance of limiting calorie intake to 1200-1500 calories per day. Will increase exercise as schedule allows.  - phentermine (ADIPEX-P) 37.5 MG tablet; Take 1 tablet (37.5 mg total) by mouth daily before breakfast.  Dispense: 30 tablet; Refill: 2  5. Primary insomnia May continue to take ambien as needed and as prescribed. Refill provided today. - zolpidem (AMBIEN) 10 MG tablet; Take 1 tablet (10 mg total) by mouth at bedtime as needed for sleep.  Dispense: 30 tablet; Refill: 2  6. Dysuria - UA/M w/rflx Culture, Routine  7. Screening for colon cancer - Ambulatory referral to Gastroenterology  General Counseling: zariyah stephens understanding of the findings of todays visit and agrees with plan of treatment. I have discussed any further diagnostic evaluation  that may be needed or ordered today. We also reviewed her medications today. she has been encouraged to call the office with any questions or concerns that should arise related to todays visit.    Counseling: Continue with current diet Importance to follow up with all scheduled screenings Encouraged to get an influenza shot at local pharmacy  Diabetes Counseling:  1. Addition of ACE inh/ ARB'S for nephroprotection. Microalbumin is updated  2. Diabetic foot care, prevention of complications. Podiatry consult 3. Exercise and lose weight.  4. Diabetic eye examination, Diabetic eye exam is updated  5. Monitor blood sugar closlely. nutrition counseling.  6. Sign and symptoms of hypoglycemia including shaking sweating,confusion and headaches.  This patient was seen by Leretha Pol FNP Collaboration with Dr Lavera Guise as a part of collaborative care agreement  Orders Placed  This Encounter  Procedures  . UA/M w/rflx Culture, Routine  . Ambulatory referral to Gastroenterology  . POCT HgB A1C    Meds ordered this encounter  Medications  . phentermine (ADIPEX-P) 37.5 MG tablet    Sig: Take 1 tablet (37.5 mg total) by mouth daily before breakfast.    Dispense:  30 tablet    Refill:  2    Order Specific Question:   Supervising Provider    Answer:   Lavera Guise [place]  . zolpidem (AMBIEN) 10 MG tablet    Sig: Take 1 tablet (10 mg total) by mouth at bedtime as needed for sleep.    Dispense:  30 tablet    Refill:  2    Order Specific Question:   Supervising Provider    Answer:   Lavera Guise [[id number]]    Time spent: [place], MD  Internal Medicine

## 2018-04-04 ENCOUNTER — Ambulatory Visit
Admit: 2018-04-04 | Discharge: 2018-04-04 | Payer: MEDICAID | Attending: Neurological Surgery | Primary: Internal Medicine

## 2018-04-04 DIAGNOSIS — D329 Benign neoplasm of meninges, unspecified: Secondary | ICD-10-CM

## 2018-04-04 MED ORDER — DEXAMETHASONE 0.5 MG PO TABS
0.5 MG | ORAL_TABLET | Freq: Two times a day (BID) | ORAL | 0 refills | Status: AC
Start: 2018-04-04 — End: 2018-04-12

## 2018-04-04 NOTE — Progress Notes (Signed)
NEUROSURGERY and SPINE POST-OP NOTE      Patient Name: Kelly Miles  Patient DOB: 05-14-1960  MRN: [id number]       PCP: Jeanett Schlein, MD      History of Present Ilness: Since surgery, she feels that she is having some weakness on the right side. Husband is noting weakness right foot, also difficulty with writing.  HA's have improved, she is now off narcotic pain medicine. Before surgery per her husband she had speech difficulties, these are persisting.         Past Medical History:        Diagnosis Date   ??? Allergic rhinitis    ??? Breast cancer ([place])    ??? Chronic back pain    ??? Colitis    ??? Fibromyalgia    ??? Hypertension    ??? Hypothyroidism    ??? Irritable bowel syndrome    ??? Neuropathy    ??? Osteoarthritis     back   ??? Restless legs syndrome        Past Surgical History:        Procedure Laterality Date   ??? BREAST SURGERY     ??? CHOLECYSTECTOMY     ??? COLONOSCOPY     ??? CRANIAL LESION RESECTION  2019   ??? INNER EAR SURGERY      both sides   ??? TONSILLECTOMY AND ADENOIDECTOMY         Home Medications:   Prior to Admission medications    Medication Sig Start Date End Date Taking? Authorizing Provider   dexamethasone (DECADRON) 0.5 MG tablet Take 4 tablets by mouth 2 times daily for 8 days 04/04/18 04/12/18 Yes Clayborn Bigness, MD   topiramate (TOPAMAX) 100 MG tablet Take 100 mg by mouth 2 times daily   Yes Historical Provider, MD   irbesartan (AVAPRO) 300 MG tablet Take 300 mg by mouth daily    Yes Historical Provider, MD   aspirin 81 MG chewable tablet Take 81 mg by mouth daily Patient stopped per Dr. Christiana Fuchs 10 days prior to surgery   Yes Historical Provider, MD   hydrochlorothiazide (HYDRODIURIL) 25 MG tablet Take 25 mg by mouth daily   Yes Historical Provider, MD   budesonide (ENTOCORT EC) 3 MG extended release capsule Take 6 mg by mouth every morning   Yes Historical Provider, MD   cholestyramine (QUESTRAN) 4 g packet Take 1 packet by mouth 2 times daily    Yes Historical Provider, MD   tiZANidine (ZANAFLEX)  4 MG tablet Take 4 mg by mouth 2 times daily  03/11/17  Yes Historical Provider, MD   Multiple Vitamins-Iron (TAB-A-VITE/IRON) TABS Take 1 tablet by mouth daily  03/01/17  Yes Historical Provider, MD   amitriptyline (ELAVIL) 10 MG tablet Take 30 mg by mouth nightly  02/16/17  Yes Historical Provider, MD   SYNTHROID 125 MCG tablet Take 112 mcg by mouth daily  06/24/14  Yes Historical Provider, MD   cetirizine (ZYRTEC) 10 MG tablet Take 10 mg by mouth daily  06/24/14  Yes Historical Provider, MD   vitamin D (CHOLECALCIFEROL) 1000 UNIT TABS tablet Take 2,000 Units by mouth daily    Yes Historical Provider, MD   LORazepam (ATIVAN) 1 MG tablet Take 1 mg by mouth daily.    Historical Provider, MD   DIPHENOXYLATE-ATROPINE PO Take 2.5 mg by mouth 4 times daily For Diarrhea/Colitis    Historical Provider, MD   Menthol, Topical Analgesic, (BIOFREEZE COLORLESS EX) Apply topically  as needed     Historical Provider, MD           Allergies: Adhesive tape; Ciprofloxacin; and Influenza vaccines    Social History:      TOBACCO:   reports that she quit smoking about 40 years ago. Her smoking use included cigarettes. She has never used smokeless tobacco.  ETOH:   reports current alcohol use.  RECREATIONAL DRUG USE:   Social History     Substance and Sexual Activity   Drug Use No       Family History:           Problem Relation Age of Onset   ??? Other Brother         allergies   ??? Brain Cancer Brother    ??? High Blood Pressure Mother    ??? Stroke Mother    ??? Heart Disease Father    ??? High Blood Pressure Sister    ??? Other Sister         allergies   ??? Mult Sclerosis Sister         Review of Systems:       Review of Systems    Physical Examination:    Vitals:    04/04/18 1536   BP: 133/86   Pulse: 101   Resp: 18   Temp: 98.2 ??F (36.8 ??C)       Physical Exam     Neurologic Exam    Awake and alert  Naming of simple objects and repetition are intact  Motor 5/5 UE and LE but slow to initiate movement  Sens intact LT  Incision C/D/I          Results  Labs:  Last 24hrs  No results found for this or any previous visit (from the past 24 hour(s)).    Radiology Personal review:   postoperative CT revealed adequate resection of left frontal tumor    ASSESSMENT / PLAN :      57 year old status post left frontal craniotomy for resection of meningioma.  She is having some difficulty initiating movements on the right side as well as slowness with speech.  She may be having a bit of a premotor syndrome.  I would like to obtain a CT the head to rule out any postoperative hemorrhage or edema and I would like to start her on Decadron 2 mg p.o. b.i.d.  She will return back to see me next week or sooner if any significant worsening.      1. Meningioma ([place])  - CT Head WO Contrast; Future            Electronically signed by Clayborn Bigness, MD on 04/04/2018 at 4:51 PM

## 2018-04-05 ENCOUNTER — Other Ambulatory Visit: Payer: Self-pay

## 2018-04-05 DIAGNOSIS — E1165 Type 2 diabetes mellitus with hyperglycemia: Secondary | ICD-10-CM

## 2018-04-05 DIAGNOSIS — I10 Essential (primary) hypertension: Secondary | ICD-10-CM

## 2018-04-05 DIAGNOSIS — Z0001 Encounter for general adult medical examination with abnormal findings: Secondary | ICD-10-CM

## 2018-04-05 DIAGNOSIS — E559 Vitamin D deficiency, unspecified: Secondary | ICD-10-CM

## 2018-04-05 LAB — MICROSCOPIC EXAMINATION
CASTS: NONE SEEN /LPF
EPITHELIAL CELLS (NON RENAL): NONE SEEN /HPF (ref 0–10)
RBC, UA: NONE SEEN /hpf (ref 0–2)

## 2018-04-05 LAB — UA/M W/RFLX CULTURE, ROUTINE
Bilirubin, UA: NEGATIVE
Ketones, UA: NEGATIVE
Leukocytes, UA: NEGATIVE
Nitrite, UA: NEGATIVE
PH UA: 5 (ref 5.0–7.5)
Protein, UA: NEGATIVE
RBC, UA: NEGATIVE
SPEC GRAV UA: 1.021 (ref 1.005–1.030)
Urobilinogen, Ur: 0.2 mg/dL (ref 0.2–1.0)

## 2018-04-06 LAB — CBC WITH DIFFERENTIAL/PLATELET
BASOS ABS: 0.1 10*3/uL (ref 0.0–0.2)
Basos: 1 %
EOS (ABSOLUTE): 0.3 10*3/uL (ref 0.0–0.4)
Eos: 5 %
HEMOGLOBIN: 11.8 g/dL (ref 11.1–15.9)
Hematocrit: 36.9 % (ref 34.0–46.6)
Immature Grans (Abs): 0 10*3/uL (ref 0.0–0.1)
Immature Granulocytes: 0 %
LYMPHS ABS: 2.2 10*3/uL (ref 0.7–3.1)
LYMPHS: 38 %
MCH: 28 pg (ref 26.6–33.0)
MCHC: 32 g/dL (ref 31.5–35.7)
MCV: 87 fL (ref 79–97)
MONOCYTES: 7 %
Monocytes Absolute: 0.4 10*3/uL (ref 0.1–0.9)
Neutrophils Absolute: 2.8 10*3/uL (ref 1.4–7.0)
Neutrophils: 49 %
Platelets: 291 10*3/uL (ref 150–450)
RBC: 4.22 x10E6/uL (ref 3.77–5.28)
RDW: 13.5 % (ref 12.3–15.4)
WBC: 5.7 10*3/uL (ref 3.4–10.8)

## 2018-04-06 LAB — T3: T3, Total: 91 ng/dL (ref 71–180)

## 2018-04-06 LAB — COMPREHENSIVE METABOLIC PANEL
A/G RATIO: 1.8 (ref 1.2–2.2)
ALBUMIN: 4.2 g/dL (ref 3.5–5.5)
ALT: 17 IU/L (ref 0–32)
AST: 18 IU/L (ref 0–40)
Alkaline Phosphatase: 67 IU/L (ref 39–117)
BUN / CREAT RATIO: 17 (ref 9–23)
BUN: 15 mg/dL (ref 6–24)
Bilirubin Total: 0.2 mg/dL (ref 0.0–1.2)
CALCIUM: 10.2 mg/dL (ref 8.7–10.2)
CO2: 24 mmol/L (ref 20–29)
Chloride: 101 mmol/L (ref 96–106)
Creatinine, Ser: 0.87 mg/dL (ref 0.57–1.00)
GFR, EST AFRICAN AMERICAN: 84 mL/min/{1.73_m2} (ref >59)
GFR, EST NON AFRICAN AMERICAN: 73 mL/min/{1.73_m2} (ref >59)
GLOBULIN, TOTAL: 2.3 g/dL (ref 1.5–4.5)
Glucose: 93 mg/dL (ref 65–99)
POTASSIUM: 5.5 mmol/L — AB (ref 3.5–5.2)
SODIUM: 143 mmol/L (ref 134–144)
TOTAL PROTEIN: 6.5 g/dL (ref 6.0–8.5)

## 2018-04-06 LAB — LIPID PANEL
CHOL/HDL RATIO: 2.8 ratio (ref 0.0–4.4)
Cholesterol, Total: 149 mg/dL (ref 100–199)
HDL: 54 mg/dL (ref >39)
LDL Calculated: 79 mg/dL (ref 0–99)
Triglycerides: 81 mg/dL (ref 0–149)
VLDL Cholesterol Cal: 16 mg/dL (ref 5–40)

## 2018-04-06 LAB — VITAMIN D 25 HYDROXY (VIT D DEFICIENCY, FRACTURES): VIT D 25 HYDROXY: 44.1 ng/mL (ref 30.0–100.0)

## 2018-04-06 LAB — TSH+FREE T4
Free T4: 1.41 ng/dL (ref 0.82–1.77)
TSH: 2.7 u[IU]/mL (ref 0.450–4.500)

## 2018-04-09 LAB — SURGICAL PATHOLOGY

## 2018-04-10 ENCOUNTER — Telehealth

## 2018-04-10 NOTE — Telephone Encounter (Signed)
Patient called asking if she needs an appt this week for staple removal. We are waiting to hear back from her insurance to see if imaging is approved. Please advise.

## 2018-04-10 NOTE — Telephone Encounter (Signed)
Let the patient know that she can see me in the office either Tuesday or Wednesday to get the staples removed.    Kelly Miles A Ihor Dow

## 2018-04-10 NOTE — Progress Notes (Signed)
Lab results routed to ordering Provider Azucena Fallen NP 04/10/18 -[place]

## 2018-04-11 ENCOUNTER — Telehealth

## 2018-04-11 ENCOUNTER — Ambulatory Visit
Admit: 2018-04-11 | Discharge: 2018-04-11 | Payer: MEDICAID | Attending: Neurological Surgery | Primary: Internal Medicine

## 2018-04-11 DIAGNOSIS — D329 Benign neoplasm of meninges, unspecified: Secondary | ICD-10-CM

## 2018-04-11 NOTE — Patient Instructions (Signed)
If you have any questions regarding today's visit please call the office at [phone]

## 2018-04-11 NOTE — Progress Notes (Addendum)
NEUROSURGERY and SPINE POST-OP NOTE      Patient Name: Kelly Miles  Patient DOB: 08-Dec-1960  MRN: [id number]       PCP: Jeanett Schlein, MD      History of Present Ilness: Patient returned to the office today to have her staples removed. Patient states she is doing better than last week. States she is able to write her name better and speech is improving.        Past Medical History:        Diagnosis Date   ??? Allergic rhinitis    ??? Breast cancer ([place])    ??? Chronic back pain    ??? Colitis    ??? Fibromyalgia    ??? Hypertension    ??? Hypothyroidism    ??? Irritable bowel syndrome    ??? Neuropathy    ??? Osteoarthritis     back   ??? Restless legs syndrome        Past Surgical History:        Procedure Laterality Date   ??? BREAST SURGERY     ??? CHOLECYSTECTOMY     ??? COLONOSCOPY     ??? CRANIAL LESION RESECTION  2019   ??? INNER EAR SURGERY      both sides   ??? TONSILLECTOMY AND ADENOIDECTOMY         Home Medications:   Prior to Admission medications    Medication Sig Start Date End Date Taking? Authorizing Provider   dexamethasone (DECADRON) 0.5 MG tablet Take 4 tablets by mouth 2 times daily for 8 days 04/04/18 04/12/18  Clayborn Bigness, MD   LORazepam (ATIVAN) 1 MG tablet Take 1 mg by mouth daily.    Historical Provider, MD   topiramate (TOPAMAX) 100 MG tablet Take 100 mg by mouth 2 times daily    Historical Provider, MD   DIPHENOXYLATE-ATROPINE PO Take 2.5 mg by mouth 4 times daily For Diarrhea/Colitis    Historical Provider, MD   irbesartan (AVAPRO) 300 MG tablet Take 300 mg by mouth daily     Historical Provider, MD   aspirin 81 MG chewable tablet Take 81 mg by mouth daily Patient stopped per Dr. Christiana Fuchs 10 days prior to surgery    Historical Provider, MD   hydrochlorothiazide (HYDRODIURIL) 25 MG tablet Take 25 mg by mouth daily    Historical Provider, MD   budesonide (ENTOCORT EC) 3 MG extended release capsule Take 6 mg by mouth every morning    Historical Provider, MD   cholestyramine (QUESTRAN) 4 g packet Take 1 packet by  mouth 2 times daily     Historical Provider, MD   Menthol, Topical Analgesic, (BIOFREEZE COLORLESS EX) Apply topically as needed     Historical Provider, MD   tiZANidine (ZANAFLEX) 4 MG tablet Take 4 mg by mouth 2 times daily  03/11/17   Historical Provider, MD   Multiple Vitamins-Iron (TAB-A-VITE/IRON) TABS Take 1 tablet by mouth daily  03/01/17   Historical Provider, MD   amitriptyline (ELAVIL) 10 MG tablet Take 30 mg by mouth nightly  02/16/17   Historical Provider, MD   SYNTHROID 125 MCG tablet Take 112 mcg by mouth daily  06/24/14   Historical Provider, MD   cetirizine (ZYRTEC) 10 MG tablet Take 10 mg by mouth daily  06/24/14   Historical Provider, MD   vitamin D (CHOLECALCIFEROL) 1000 UNIT TABS tablet Take 2,000 Units by mouth daily     Historical Provider, MD  Allergies: Adhesive tape; Ciprofloxacin; and Influenza vaccines    Social History:      TOBACCO:   reports that she quit smoking about 40 years ago. Her smoking use included cigarettes. She has never used smokeless tobacco.  ETOH:   reports current alcohol use.  RECREATIONAL DRUG USE:   Social History     Substance and Sexual Activity   Drug Use No       Family History:           Problem Relation Age of Onset   ??? Other Brother         allergies   ??? Brain Cancer Brother    ??? High Blood Pressure Mother    ??? Stroke Mother    ??? Heart Disease Father    ??? High Blood Pressure Sister    ??? Other Sister         allergies   ??? Mult Sclerosis Sister         Review of Systems:       Review of Systems    Physical Examination:    Vitals:    04/11/18 1509   BP: 124/81   Pulse: 81   Resp: 18       Physical Exam     Neurologic Exam    Awake and alert  Incision C/D/I         Results  Labs:  Last 24hrs  No results found for this or any previous visit (from the past 24 hour(s)).    Radiology Personal review:  postoperative CT revealed adequate resection of left frontal tumor    ASSESSMENT / PLAN :   57 year old s/p left frontal craniotomy for resection of meningioma 2  weeks ago. She returned to the office today for staple removal. Her incision is healing well. Her speech and right sided weakness is improving. She is scheduled to have a CT and will follow up next week. Case was discussed with Dr. Christiana Fuchs.    1. Meningioma ([place])      Kelly Miles        Electronically signed by Valorie Roosevelt, PA-C on 04/11/2018 at 3:29 PM

## 2018-04-11 NOTE — Telephone Encounter (Signed)
Patient had questions about driving during her visit today. Discussed case with Dr. Christiana Fuchs. Told patient to not drive until her appointment with Dr. Christiana Fuchs next week, so he can reevaluate her and her CT.

## 2018-04-17 ENCOUNTER — Inpatient Hospital Stay: Admit: 2018-04-17 | Attending: Neurological Surgery | Primary: Internal Medicine

## 2018-04-17 DIAGNOSIS — D329 Benign neoplasm of meninges, unspecified: Secondary | ICD-10-CM

## 2018-04-17 NOTE — Other (Unsigned)
Patient Acct Nbr: [id number]   Primary AUTH/CERT: [id number]  Primary Insurance Company Name: [place] name: [facility]  Primary Insurance Group Number: [id number]  Primary Insurance Plan Type: Health  Primary Insurance Policy Number: [id number]

## 2018-04-19 ENCOUNTER — Ambulatory Visit
Admit: 2018-04-19 | Discharge: 2018-04-19 | Payer: MEDICAID | Attending: Neurological Surgery | Primary: Internal Medicine

## 2018-04-19 DIAGNOSIS — D329 Benign neoplasm of meninges, unspecified: Secondary | ICD-10-CM

## 2018-04-19 NOTE — Patient Instructions (Signed)
If you have any questions regarding today's visit please call the office at [phone]

## 2018-04-19 NOTE — Progress Notes (Signed)
NEUROSURGERY and SPINE POST-OP NOTE      Patient Name: Kelly Miles  Patient DOB: [birth year]  MRN: [id number]       PCP: Jeanett Schlein, MD      History of Present Ilness: Doing much better. Some HA's, improving. Writing and walking is back to normal. No significant weakness or numbness.        Past Medical History:        Diagnosis Date   ??? Allergic rhinitis    ??? Breast cancer ([place])    ??? Chronic back pain    ??? Colitis    ??? Fibromyalgia    ??? Hypertension    ??? Hypothyroidism    ??? Irritable bowel syndrome    ??? Neuropathy    ??? Osteoarthritis     back   ??? Restless legs syndrome        Past Surgical History:        Procedure Laterality Date   ??? BREAST SURGERY     ??? CHOLECYSTECTOMY     ??? COLONOSCOPY     ??? CRANIAL LESION RESECTION  2019   ??? INNER EAR SURGERY      both sides   ??? TONSILLECTOMY AND ADENOIDECTOMY         Home Medications:   Prior to Admission medications    Medication Sig Start Date End Date Taking? Authorizing Provider   LORazepam (ATIVAN) 1 MG tablet Take 1 mg by mouth daily.    Historical Provider, MD   topiramate (TOPAMAX) 100 MG tablet Take 100 mg by mouth 2 times daily    Historical Provider, MD   DIPHENOXYLATE-ATROPINE PO Take 2.5 mg by mouth 4 times daily For Diarrhea/Colitis    Historical Provider, MD   irbesartan (AVAPRO) 300 MG tablet Take 300 mg by mouth daily     Historical Provider, MD   aspirin 81 MG chewable tablet Take 81 mg by mouth daily Patient stopped per Dr. Christiana Fuchs 10 days prior to surgery    Historical Provider, MD   hydrochlorothiazide (HYDRODIURIL) 25 MG tablet Take 25 mg by mouth daily    Historical Provider, MD   budesonide (ENTOCORT EC) 3 MG extended release capsule Take 6 mg by mouth every morning    Historical Provider, MD   cholestyramine (QUESTRAN) 4 g packet Take 1 packet by mouth 2 times daily     Historical Provider, MD   Menthol, Topical Analgesic, (BIOFREEZE COLORLESS EX) Apply topically as needed     Historical Provider, MD   tiZANidine (ZANAFLEX) 4 MG tablet Take  4 mg by mouth 2 times daily  03/11/17   Historical Provider, MD   Multiple Vitamins-Iron (TAB-A-VITE/IRON) TABS Take 1 tablet by mouth daily  03/01/17   Historical Provider, MD   amitriptyline (ELAVIL) 10 MG tablet Take 30 mg by mouth nightly  02/16/17   Historical Provider, MD   SYNTHROID 125 MCG tablet Take 112 mcg by mouth daily  06/24/14   Historical Provider, MD   cetirizine (ZYRTEC) 10 MG tablet Take 10 mg by mouth daily  06/24/14   Historical Provider, MD   vitamin D (CHOLECALCIFEROL) 1000 UNIT TABS tablet Take 2,000 Units by mouth daily     Historical Provider, MD           Allergies: Adhesive tape; Ciprofloxacin; and Influenza vaccines    Social History:      TOBACCO:   reports that she quit smoking about 40 years ago. Her smoking use included cigarettes. She  has never used smokeless tobacco.  ETOH:   reports current alcohol use.  RECREATIONAL DRUG USE:   Social History     Substance and Sexual Activity   Drug Use No       Family History:           Problem Relation Age of Onset   ??? Other Brother         allergies   ??? Brain Cancer Brother    ??? High Blood Pressure Mother    ??? Stroke Mother    ??? Heart Disease Father    ??? High Blood Pressure Sister    ??? Other Sister         allergies   ??? Mult Sclerosis Sister         Review of Systems:       Review of Systems    Physical Examination:    Vitals:    04/19/18 1408   BP: 107/69   Pulse: 84   Resp: 18       Physical Exam     Neurologic Exam    Awake and alertox3  Motor 5/5 UE and LE  Sens intact LT  Incision C/D/I         Results  Labs:  Last 24hrs  No results found for this or any previous visit (from the past 24 hour(s)).    Radiology Personal review:  CT head reveals minimal left frontal edema, minimal extra-axial fluid    ASSESSMENT / PLAN :      57 year old status post resection of left frontal meningioma approximately 3 weeks ago, she is significantly improved and has no focal motor deficits.  She does have some headache which is improving.  Her CT head shows  some minimal left frontal edema which is improving.   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  I will allow her to heal up and she will return back to see me at 3 months postoperative with an magnetic resonance imaging brain with and without contrast before her appointment.  If that looks good, we will plan on checking another magnetic resonance imaging 1 year down the line.  She also has cervical stenosis which we will follow clinically.      1. Meningioma (HCC)  - Creatinine, Serum; Future  - MRI Brain W WO Contrast; Future            Electronically signed by Clayborn Bigness, MD on 04/19/2018 at 2:32 PM

## 2018-05-01 ENCOUNTER — Other Ambulatory Visit: Payer: Self-pay | Admitting: Nurse Practitioner

## 2018-05-01 DIAGNOSIS — J069 Acute upper respiratory infection, unspecified: Secondary | ICD-10-CM

## 2018-05-01 DIAGNOSIS — R05 Cough: Secondary | ICD-10-CM

## 2018-05-01 DIAGNOSIS — R059 Cough, unspecified: Secondary | ICD-10-CM

## 2018-05-01 MED ORDER — BENZONATATE 200 MG PO CAPS: 200.0000 mg | ORAL_CAPSULE | Freq: Two times a day (BID) | ORAL | 0 refills | Status: DC | PRN

## 2018-05-01 MED ORDER — SULFAMETHOXAZOLE-TRIMETHOPRIM 800-160 MG PO TABS: 1.0000 | ORAL_TABLET | Freq: Two times a day (BID) | ORAL | 0 refills | Status: DC

## 2018-05-01 NOTE — Progress Notes (Signed)
The patient c/o nasal congestion, ear pain, sore throat and cough. No fever. Sent prescription for bactrim DS twice dail for 10 days and tessalon perls as needed for cough. [facility]

## 2018-05-05 ENCOUNTER — Other Ambulatory Visit: Payer: Self-pay | Admitting: Internal Medicine

## 2018-05-29 ENCOUNTER — Telehealth: Payer: Self-pay

## 2018-05-29 ENCOUNTER — Other Ambulatory Visit: Payer: Self-pay | Admitting: Nurse Practitioner

## 2018-05-29 DIAGNOSIS — F5101 Primary insomnia: Secondary | ICD-10-CM

## 2018-05-29 MED ORDER — ZOLPIDEM TARTRATE 10 MG PO TABS: 10.0000 mg | ORAL_TABLET | Freq: Every evening | ORAL | 2 refills | Status: DC | PRN

## 2018-05-29 NOTE — Telephone Encounter (Signed)
Approved zolpidem 10mg  at bedtime as needed #30 with 2 refills.

## 2018-05-29 NOTE — Progress Notes (Signed)
Approved zolpidem 10mg  at bedtime as needed #30 with 2 refills.

## 2018-05-30 NOTE — Telephone Encounter (Signed)
Informed pt that her zolpidem was sent to her pharmacy. TLW

## 2018-06-23 ENCOUNTER — Other Ambulatory Visit: Payer: Self-pay

## 2018-06-23 MED ORDER — OMEPRAZOLE 40 MG PO CPDR: 40.0000 mg | DELAYED_RELEASE_CAPSULE | Freq: Every day | ORAL | 5 refills | Status: DC

## 2018-07-06 ENCOUNTER — Encounter: Payer: Self-pay | Admitting: *Deleted

## 2018-07-07 ENCOUNTER — Encounter: Payer: Self-pay | Admitting: *Deleted

## 2018-07-07 ENCOUNTER — Ambulatory Visit
Admission: RE | Admit: 2018-07-07 | Discharge: 2018-07-07 | Disposition: A | Discharge status: Home / Self Care | Payer: BC Managed Care – PPO | Attending: Gastroenterology | Admitting: Gastroenterology

## 2018-07-07 ENCOUNTER — Ambulatory Visit: Payer: BC Managed Care – PPO | Admitting: Certified Registered"

## 2018-07-07 ENCOUNTER — Encounter
Admission: RE | Disposition: A | Discharge status: Home / Self Care | Payer: Self-pay | Source: Home / Self Care | Attending: Gastroenterology

## 2018-07-07 DIAGNOSIS — E785 Hyperlipidemia, unspecified: Secondary | ICD-10-CM | POA: Insufficient documentation

## 2018-07-07 DIAGNOSIS — E119 Type 2 diabetes mellitus without complications: Secondary | ICD-10-CM | POA: Insufficient documentation

## 2018-07-07 DIAGNOSIS — K6389 Other specified diseases of intestine: Secondary | ICD-10-CM | POA: Diagnosis not present

## 2018-07-07 DIAGNOSIS — K219 Gastro-esophageal reflux disease without esophagitis: Secondary | ICD-10-CM | POA: Insufficient documentation

## 2018-07-07 DIAGNOSIS — Z79899 Other long term (current) drug therapy: Secondary | ICD-10-CM | POA: Insufficient documentation

## 2018-07-07 DIAGNOSIS — Z7982 Long term (current) use of aspirin: Secondary | ICD-10-CM | POA: Insufficient documentation

## 2018-07-07 DIAGNOSIS — I10 Essential (primary) hypertension: Secondary | ICD-10-CM | POA: Diagnosis not present

## 2018-07-07 DIAGNOSIS — Z86010 Personal history of colonic polyps: Secondary | ICD-10-CM | POA: Diagnosis not present

## 2018-07-07 DIAGNOSIS — K5730 Diverticulosis of large intestine without perforation or abscess without bleeding: Secondary | ICD-10-CM | POA: Insufficient documentation

## 2018-07-07 DIAGNOSIS — Z794 Long term (current) use of insulin: Secondary | ICD-10-CM | POA: Diagnosis not present

## 2018-07-07 DIAGNOSIS — Z1211 Encounter for screening for malignant neoplasm of colon: Secondary | ICD-10-CM | POA: Diagnosis not present

## 2018-07-07 DIAGNOSIS — Z800 Family history of malignant neoplasm of digestive organs: Secondary | ICD-10-CM | POA: Diagnosis not present

## 2018-07-07 HISTORY — PX: COLONOSCOPY WITH PROPOFOL: SHX5780

## 2018-07-07 HISTORY — DX: Lateral epicondylitis, right elbow: M77.11

## 2018-07-07 LAB — GLUCOSE, CAPILLARY: Glucose-Capillary: 156 mg/dL — ABNORMAL HIGH (ref 70–99)

## 2018-07-07 SURGERY — COLONOSCOPY WITH PROPOFOL
Anesthesia: General

## 2018-07-07 MED ORDER — PROPOFOL 500 MG/50ML IV EMUL
INTRAVENOUS | Status: DC | PRN
  Administered 2018-07-07: 150 ug/kg/min via INTRAVENOUS

## 2018-07-07 MED ORDER — PROPOFOL 500 MG/50ML IV EMUL
INTRAVENOUS | Status: AC
  Filled 2018-07-07: qty 50

## 2018-07-07 MED ORDER — PROPOFOL 10 MG/ML IV BOLUS
INTRAVENOUS | Status: DC | PRN
  Administered 2018-07-07: 50 mg via INTRAVENOUS

## 2018-07-07 MED ORDER — PROPOFOL 10 MG/ML IV BOLUS
INTRAVENOUS | Status: AC
  Filled 2018-07-07: qty 40

## 2018-07-07 MED ORDER — SODIUM CHLORIDE 0.9 % IV SOLN
INTRAVENOUS | Status: DC
  Administered 2018-07-07: 07:00:00 via INTRAVENOUS

## 2018-07-07 NOTE — H&P (Signed)
Outpatient short stay form Pre-procedure 07/07/2018 7:28 AM Lollie Sails MD  Primary Physician: Leretha Pol NP  Reason for visit: Colonoscopy  History of present illness: Patient is a 58 year old female presenting today for colonoscopy.  Is a personal history of adenomatous colon polyps and there is a family history of colon cancer in a primary relative, father.  However her last colonoscopy was 05/30/2007.  She tolerated her prep well.  Takes no aspirin or blood thinning agent with the exception of 81 mg aspirin that is been held for several days.    Current Facility-Administered Medications:  .  0.9 %  sodium chloride infusion, , Intravenous, Continuous, Lollie Sails, MD  Medications Prior to Admission  Medication Sig Dispense Refill Last Dose  . bisoprolol (ZEBETA) 5 MG tablet Take 1 tablet (5 mg total) by mouth at bedtime. 30 tablet 5 Past Week at Unknown time  . allopurinol (ZYLOPRIM) 100 MG tablet Take by mouth.   Taking  . aspirin EC 81 MG tablet Take 81 mg by mouth daily.   Taking  . atorvastatin (LIPITOR) 10 MG tablet Take 1/2 tab po  Daily for high cholesterol 15 tablet 5 Taking  . benzonatate (TESSALON) 200 MG capsule Take 1 capsule (200 mg total) by mouth 2 (two) times daily as needed for cough. 20 capsule 0   . conjugated estrogens (PREMARIN) vaginal cream Place vaginally.   Taking  . Dulaglutide (TRULICITY) 1.5 WU/9.8JX SOPN INJECT 1.5MG  ONCE A WEEK 2 mL 3 Taking  . furosemide (LASIX) 20 MG tablet Take 1 tablet (20 mg total) by mouth daily as needed. 30 tablet 3 Taking  . Insulin Detemir (LEVEMIR FLEXTOUCH) 100 UNIT/ML Pen 30 units at bedtime   Taking  . LINZESS 145 MCG CAPS capsule Take 1 capsule (145 mcg total) by mouth daily. 30 capsule 3 Taking  . lisinopril (PRINIVIL,ZESTRIL) 2.5 MG tablet TAKE ONE TAB EVERY DAY 30 tablet 5 Taking  . metFORMIN (GLUCOPHAGE) 1000 MG tablet TAKE 1 TABLET BY MOUTH TWICE DAILY 60 tablet 3   . metFORMIN (GLUCOPHAGE-XR) 750 MG 24  hr tablet Take by mouth.   Taking  . omeprazole (PRILOSEC) 40 MG capsule Take 1 capsule (40 mg total) by mouth daily. 30 capsule 5   . ondansetron (ZOFRAN) 8 MG tablet Take 1 tab po twice a daily as needed for nausea 60 tablet 2 Taking  . ONE TOUCH ULTRA TEST test strip    Taking  . phentermine (ADIPEX-P) 37.5 MG tablet Take 1 tablet (37.5 mg total) by mouth daily before breakfast. 30 tablet 2   . rifaximin (XIFAXAN) 550 MG TABS tablet Take 1 tablet (550 mg total) by mouth 2 (two) times daily. 42 tablet 0 Taking  . zolpidem (AMBIEN) 10 MG tablet Take 1 tablet (10 mg total) by mouth at bedtime as needed for sleep. 30 tablet 2      No Known Allergies   Past Medical History:  Diagnosis Date  . Diabetes mellitus without complication ([employer])   . GERD (gastroesophageal reflux disease)   . Hyperlipidemia   . Hypertension   . Lateral epicondylitis of right elbow     Review of systems:      Physical Exam    Heart and lungs: Rhythm without rub or gallop, lungs are bilaterally clear.    HEENT: Normocephalic atraumatic eyes are anicteric    Other:    Pertinant exam for procedure: Soft nontender nondistended bowel sounds positive normoactive.    Planned proceedures: Colonoscopy and indicated  procedures. I have discussed the risks benefits and complications of procedures to include not limited to bleeding, infection, perforation and the risk of sedation and the patient wishes to proceed.    Lollie Sails, MD Gastroenterology 07/07/2018  7:28 AM

## 2018-07-07 NOTE — Anesthesia Preprocedure Evaluation (Signed)
Anesthesia Evaluation  Patient identified by MRN, date of birth, ID band Patient awake    Reviewed: Allergy & Precautions, H&P , NPO status , Patient's Chart, lab work & pertinent test results, reviewed documented beta blocker date and time   History of Anesthesia Complications Negative for: history of anesthetic complications  Airway Mallampati: III  TM Distance: >3 FB Neck ROM: full    Dental  (+) Dental Advidsory Given, Teeth Intact, Missing   Pulmonary neg pulmonary ROS,           Cardiovascular Exercise Tolerance: Good hypertension, (-) angina(-) CAD, (-) Past MI, (-) Cardiac Stents and (-) CABG (-) dysrhythmias (-) Valvular Problems/Murmurs     Neuro/Psych negative neurological ROS  negative psych ROS   GI/Hepatic Neg liver ROS, GERD  ,  Endo/Other  diabetes  Renal/GU negative Renal ROS  negative genitourinary   Musculoskeletal   Abdominal   Peds  Hematology negative hematology ROS (+)   Anesthesia Other Findings Past Medical History: No date: Diabetes mellitus without complication (HCC) No date: GERD (gastroesophageal reflux disease) No date: Hyperlipidemia No date: Hypertension No date: Lateral epicondylitis of right elbow   Reproductive/Obstetrics negative OB ROS                             Anesthesia Physical Anesthesia Plan  ASA: II  Anesthesia Plan: General   Post-op Pain Management:    Induction: Intravenous  PONV Risk Score and Plan: 3 and Propofol infusion and TIVA  Airway Management Planned: Natural Airway and Nasal Cannula  Additional Equipment:   Intra-op Plan:   Post-operative Plan:   Informed Consent: I have reviewed the patients History and Physical, chart, labs and discussed the procedure including the risks, benefits and alternatives for the proposed anesthesia with the patient or authorized representative who has indicated his/her understanding and  acceptance.     Dental Advisory Given  Plan Discussed with: Anesthesiologist, CRNA and Surgeon  Anesthesia Plan Comments:         Anesthesia Quick Evaluation

## 2018-07-07 NOTE — Op Note (Addendum)
[facility] Gastroenterology Patient Name: Chelsea Rivera Procedure Date: 07/07/2018 7:15 AM MRN: [id number] Account #: [id number] Date of Birth: 03/27/1961 Admit Type: Outpatient Age: 58 Room: [facility] ENDO ROOM 3 Gender: Female Note Status: Finalized Procedure:            Colonoscopy Indications:          Family history of colon cancer in a first-degree                        relative, Personal history of colonic polyps Providers:            Lollie Sails, MD Medicines:            Monitored Anesthesia Care Complications:        No immediate complications. Procedure:            Pre-Anesthesia Assessment:                       - ASA Grade Assessment: II - A patient with mild                        systemic disease.                       After obtaining informed consent, the colonoscope was                        passed under direct vision. Throughout the procedure,                        the patient's blood pressure, pulse, and oxygen                        saturations were monitored continuously. The                        Colonoscope was introduced through the anus and                        advanced to the the cecum, identified by appendiceal                        orifice and ileocecal valve. The colonoscopy was                        performed without difficulty. The patient tolerated the                        procedure well. The quality of the bowel preparation                        was good. Findings:      A few small-mouthed diverticula were found in the sigmoid colon,       descending colon and distal transverse colon.      The digital rectal exam was normal.      The exam was otherwise without abnormality.      A diffuse area of mininal melanosis was found in the entire colon. Impression:           - Diverticulosis in the sigmoid colon, in the  descending colon and in the distal transverse colon.                       - The  examination was otherwise normal.                       - No specimens collected. Recommendation:       - Discharge patient to home.                       - Repeat colonoscopy in 5 years for screening purposes. Procedure Code(s):    --- Professional ---                       [phone], Colonoscopy, flexible; diagnostic, including                        collection of specimen(s) by brushing or washing, when                        performed (separate procedure) Diagnosis Code(s):    --- Professional ---                       Z80.0, Family history of malignant neoplasm of                        digestive organs                       Z86.010, Personal history of colonic polyps                       K57.30, Diverticulosis of large intestine without                        perforation or abscess without bleeding CPT copyright 2018 American Medical Association. All rights reserved. The codes documented in this report are preliminary and upon coder review may  be revised to meet current compliance requirements. Lollie Sails, MD 07/07/2018 8:15:44 AM This report has been signed electronically. Number of Addenda: 0 Note Initiated On: 07/07/2018 7:15 AM Scope Withdrawal Time: 0 hours 12 minutes 13 seconds  Total Procedure Duration: 0 hours 26 minutes 53 seconds       [facility]

## 2018-07-07 NOTE — Anesthesia Postprocedure Evaluation (Signed)
Anesthesia Post Note  Patient: Chelsea Rivera  Procedure(s) Performed: COLONOSCOPY WITH PROPOFOL (N/A )  Patient location during evaluation: Endoscopy Anesthesia Type: General Level of consciousness: awake and alert Pain management: pain level controlled Vital Signs Assessment: post-procedure vital signs reviewed and stable Respiratory status: spontaneous breathing, nonlabored ventilation, respiratory function stable and patient connected to nasal cannula oxygen Cardiovascular status: blood pressure returned to baseline and stable Postop Assessment: no apparent nausea or vomiting Anesthetic complications: no     Last Vitals:  Vitals:   07/07/18 0840 07/07/18 0850  BP: 102/87 134/72  Pulse: 86 81  Resp: 19 17  Temp:    SpO2: 100% 100%    Last Pain:  Vitals:   07/07/18 0820  TempSrc: Tympanic  PainSc: 0-No pain                 Martha Clan

## 2018-07-07 NOTE — Transfer of Care (Signed)
Immediate Anesthesia Transfer of Care Note  Patient: Chelsea Rivera  Procedure(s) Performed: COLONOSCOPY WITH PROPOFOL (N/A )  Patient Location: Endoscopy Unit  Anesthesia Type:General  Level of Consciousness: awake, alert  and oriented  Airway & Oxygen Therapy: Patient Spontanous Breathing and Patient connected to nasal cannula oxygen  Post-op Assessment: Report given to RN and Post -op Vital signs reviewed and stable  Post vital signs: Reviewed and stable  Last Vitals:  Vitals Value Taken Time  BP    Temp    Pulse    Resp    SpO2      Last Pain:  Vitals:   07/07/18 0719  TempSrc: Tympanic         Complications: No apparent anesthesia complications

## 2018-07-07 NOTE — Anesthesia Post-op Follow-up Note (Signed)
Anesthesia QCDR form completed.        

## 2018-07-10 ENCOUNTER — Ambulatory Visit: Payer: Self-pay | Admitting: Nurse Practitioner

## 2018-07-15 ENCOUNTER — Other Ambulatory Visit: Payer: Self-pay | Admitting: Internal Medicine

## 2018-07-17 ENCOUNTER — Other Ambulatory Visit: Payer: Self-pay

## 2018-07-17 MED ORDER — INSULIN DETEMIR 100 UNIT/ML FLEXPEN: PEN_INJECTOR | SUBCUTANEOUS | 3 refills | Status: DC

## 2018-07-20 ENCOUNTER — Encounter: Payer: Self-pay | Admitting: Nurse Practitioner

## 2018-07-21 ENCOUNTER — Other Ambulatory Visit: Payer: Self-pay

## 2018-07-21 MED ORDER — METFORMIN HCL 1000 MG PO TABS: 1000.0000 mg | ORAL_TABLET | Freq: Two times a day (BID) | ORAL | 3 refills | Status: DC

## 2018-07-25 ENCOUNTER — Inpatient Hospital Stay: Admit: 2018-07-25 | Attending: Neurological Surgery | Primary: Internal Medicine

## 2018-07-25 DIAGNOSIS — D329 Benign neoplasm of meninges, unspecified: Secondary | ICD-10-CM

## 2018-07-25 NOTE — Other (Unsigned)
Patient Acct Nbr: [id number]   Primary AUTH/CERT: [id number]  [place] Name: [place] name: [facility]  Primary Insurance Group Number: [facility]  Primary Insurance Plan Type: Health  Primary Insurance Policy Number: [id number]

## 2018-07-28 NOTE — Telephone Encounter (Signed)
Obtained consent for Telehealth visit on 08/01/2018 at 01:15PM with Dr. Christiana Fuchs. Precharted for appt also.

## 2018-08-01 ENCOUNTER — Ambulatory Visit
Admit: 2018-08-01 | Discharge: 2018-08-01 | Payer: MEDICAID | Attending: Neurological Surgery | Primary: Internal Medicine

## 2018-08-01 DIAGNOSIS — D329 Benign neoplasm of meninges, unspecified: Secondary | ICD-10-CM

## 2018-08-01 NOTE — Progress Notes (Signed)
NEUROSURGERY and SPINE FOLLOW-UP NOTE      Patient Name: Kelly Miles  Patient DOB: 04/29/1961  MRN: [id number]       PCP: Jeanett Schlein, MD      History of Present Ilness: 58 y.o. presents via Telehealth visit for MRI follow up. She had a crani for meningioma about 4 months ago. She is doing well. Still having headache that she describes as a constant ache that usually occurs in the evenings. She is also having some chronic blurry vision and states that she needs to see her eye doctor at some point. Denies fevers, chills, CP, SOB, numbness, tingling, weakness. No c/o speech difficulties. No complaints of any pain down the arms.  She does complain of some memory complaints which are short-term memory and possibly improving.      Chief Complaint   Patient presents with   ??? Follow-up     MRI on 07-25-18. No new concerns        Past Medical History:        Diagnosis Date   ??? Allergic rhinitis    ??? Breast cancer ([place])    ??? Chronic back pain    ??? Colitis    ??? Fibromyalgia    ??? Hypertension    ??? Hypothyroidism    ??? Irritable bowel syndrome    ??? Neuropathy    ??? Osteoarthritis     back   ??? Restless legs syndrome        Past Surgical History:        Procedure Laterality Date   ??? BREAST SURGERY     ??? CHOLECYSTECTOMY     ??? COLONOSCOPY     ??? CRANIAL LESION RESECTION  2019   ??? INNER EAR SURGERY      both sides   ??? TONSILLECTOMY AND ADENOIDECTOMY         Home Medications:   Prior to Admission medications    Medication Sig Start Date End Date Taking? Authorizing Provider   traMADol (ULTRAM) 50 MG tablet Take 50 mg by mouth nightly.    Historical Provider, MD   LORazepam (ATIVAN) 1 MG tablet Take 1 mg by mouth daily.    Historical Provider, MD   topiramate (TOPAMAX) 100 MG tablet Take 100 mg by mouth 2 times daily    Historical Provider, MD   DIPHENOXYLATE-ATROPINE PO Take 2.5 mg by mouth 4 times daily For Diarrhea/Colitis    Historical Provider, MD   irbesartan (AVAPRO) 300 MG tablet Take 300 mg by mouth daily     Historical  Provider, MD   aspirin 81 MG chewable tablet Take 81 mg by mouth daily Patient stopped per Dr. Christiana Fuchs 10 days prior to surgery    Historical Provider, MD   hydrochlorothiazide (HYDRODIURIL) 25 MG tablet Take 25 mg by mouth daily    Historical Provider, MD   budesonide (ENTOCORT EC) 3 MG extended release capsule Take 6 mg by mouth every morning    Historical Provider, MD   cholestyramine (QUESTRAN) 4 g packet Take 1 packet by mouth 2 times daily     Historical Provider, MD   Menthol, Topical Analgesic, (BIOFREEZE COLORLESS EX) Apply topically as needed     Historical Provider, MD   tiZANidine (ZANAFLEX) 4 MG tablet Take 4 mg by mouth 2 times daily  03/11/17   Historical Provider, MD   Multiple Vitamins-Iron (TAB-A-VITE/IRON) TABS Take 1 tablet by mouth daily  03/01/17   Historical Provider, MD   amitriptyline (  ELAVIL) 10 MG tablet Take 30 mg by mouth nightly  02/16/17   Historical Provider, MD   SYNTHROID 125 MCG tablet Take 112 mcg by mouth daily  06/24/14   Historical Provider, MD   cetirizine (ZYRTEC) 10 MG tablet Take 10 mg by mouth daily  06/24/14   Historical Provider, MD   vitamin D (CHOLECALCIFEROL) 1000 UNIT TABS tablet Take 2,000 Units by mouth daily     Historical Provider, MD           Allergies: Adhesive tape; Ciprofloxacin; and Influenza vaccines    Social History:      TOBACCO:   reports that she quit smoking about 40 years ago. Her smoking use included cigarettes. She has a 0.25 pack-year smoking history. She has never used smokeless tobacco.  ETOH:   reports previous alcohol use.  RECREATIONAL DRUG USE:   Social History     Substance and Sexual Activity   Drug Use No       Family History:           Problem Relation Age of Onset   ??? Other Brother         allergies   ??? Brain Cancer Brother    ??? High Blood Pressure Mother    ??? Stroke Mother    ??? Heart Disease Father    ??? High Blood Pressure Sister    ??? Other Sister         allergies   ??? Mult Sclerosis Sister              Review of Systems:    Review of  Systems   Constitutional: Negative for chills and fever.   HENT: Negative for congestion, rhinorrhea and sore throat.    Eyes: Positive for visual disturbance. Negative for photophobia.   Respiratory: Negative for cough and shortness of breath.    Cardiovascular: Negative for chest pain and palpitations.   Gastrointestinal: Negative for abdominal pain, nausea and vomiting.   Genitourinary: Negative for decreased urine volume and difficulty urinating.   Musculoskeletal: Negative for back pain, gait problem and neck pain.   Skin: Negative for rash and wound.   Neurological: Positive for headaches. Negative for seizures, speech difficulty and weakness.   Psychiatric/Behavioral: Negative for behavioral problems and confusion.         Physical Examination:    There were no vitals filed for this visit.    Physical Exam           Results  Labs:  Last 24hrs  No results found for this or any previous visit (from the past 24 hour(s)).    Radiology Personal review:  Magnetic resonance imaging of the brain with and without contrast reveals postoperative changes with no significant enhancement or evidence of recurrent or residual meningioma    ASSESSMENT / PLAN :     58 year old status post resection of left frontal meningioma approximately 4 months ago.  Overall she is doing well with no significant neurological deficits.  Her magnetic resonance imaging looks good and reveals no recurrent or residual enhancing meningioma.  I will plan on obtaining another magnetic resonance imaging of the brain with and without contrast in 1 year.  This was a telehealth visit via telephone and I have offered the patient to see me in person in approximate 6 weeks and she will do that.  She does also have some moderate stenosis from C5-C7 but is not currently having significant symptoms related to this.  1. Meningioma ([place])    2. Cervical stenosis of spine            Electronically signed by Clayborn Bigness, MD on 08/01/2018 at 1:51 PM

## 2018-08-01 NOTE — Patient Instructions (Signed)
If you have any questions regarding today's visit please call the office at [phone]

## 2018-08-15 ENCOUNTER — Encounter: Payer: Self-pay | Admitting: Nurse Practitioner

## 2018-08-16 ENCOUNTER — Encounter: Payer: Self-pay | Admitting: Nurse Practitioner

## 2018-08-21 ENCOUNTER — Encounter: Payer: Self-pay | Admitting: Nurse Practitioner

## 2018-08-21 ENCOUNTER — Ambulatory Visit (INDEPENDENT_AMBULATORY_CARE_PROVIDER_SITE_OTHER): Payer: BC Managed Care – PPO | Admitting: Nurse Practitioner

## 2018-08-21 ENCOUNTER — Other Ambulatory Visit: Payer: Self-pay

## 2018-08-21 VITALS — HR 78 | Resp 16 | Ht 64.0 in | Wt 158.0 lb

## 2018-08-21 DIAGNOSIS — I10 Essential (primary) hypertension: Secondary | ICD-10-CM

## 2018-08-21 DIAGNOSIS — R635 Abnormal weight gain: Secondary | ICD-10-CM

## 2018-08-21 DIAGNOSIS — R110 Nausea: Secondary | ICD-10-CM | POA: Diagnosis not present

## 2018-08-21 DIAGNOSIS — E1165 Type 2 diabetes mellitus with hyperglycemia: Secondary | ICD-10-CM

## 2018-08-21 MED ORDER — ONDANSETRON HCL 8 MG PO TABS: ORAL_TABLET | ORAL | 2 refills | Status: DC

## 2018-08-21 MED ORDER — PHENTERMINE HCL 37.5 MG PO TABS: 37.5000 mg | ORAL_TABLET | Freq: Every day | ORAL | 2 refills | Status: DC

## 2018-08-21 NOTE — Progress Notes (Signed)
[facility] [place], [place] [address]  Internal MEDICINE  Telephone Visit  Patient Name: Chelsea Rivera  [id number]  [id number]   Date of Service: 08/26/2018  I connected with the patient at 4:37pm by webcam and verified the patients identity using two identifiers.   I discussed the limitations, risks, security and privacy concerns of performing an evaluation and management service by webcam and the availability of in person appointments. I also discussed with the patient that there may be a patient responsible charge related to the service.  The patient expressed understanding and agrees to proceed.    Chief Complaint  Patient presents with  . Telephone Assessment  . Telephone Screen  . Diabetes    glucose  fasting 206    The patient has been contacted via webcam for follow up visit due to concerns for spread of novel coronavirus. The patient states that she has been doing well with blood sugars. Has been able to decrease her insulin by 10units every day. States that she has started to gain weight again. Had lost 20 pounds. Since stay-at-home orders were inacted, she has begun to gain some of this weight back. She would like to restart phentermine as she did very well with this medication and had no negative side effects.       Current Medication: Outpatient Encounter Medications as of 08/21/2018  Medication Sig Note  . allopurinol (ZYLOPRIM) 100 MG tablet Take by mouth. 05/14/2016: Received from: [place]: Take 100 mg by mouth.  Marland Kitchen aspirin EC 81 MG tablet Take 81 mg by mouth daily.   Marland Kitchen conjugated estrogens (PREMARIN) vaginal cream Place vaginally. 05/14/2016: Received from: [place]: Place vaginally.  . furosemide (LASIX) 20 MG tablet Take 1 tablet (20 mg total) by mouth daily as needed.   . Insulin Detemir (LEVEMIR FLEXTOUCH) 100 UNIT/ML Pen 30 units at bedtime   . LINZESS 145 MCG CAPS  capsule Take 1 capsule (145 mcg total) by mouth daily.   Marland Kitchen lisinopril (PRINIVIL,ZESTRIL) 2.5 MG tablet TAKE ONE TAB EVERY DAY   . metFORMIN (GLUCOPHAGE) 1000 MG tablet Take 1 tablet (1,000 mg total) by mouth 2 (two) times daily.   Marland Kitchen omeprazole (PRILOSEC) 40 MG capsule Take 1 capsule (40 mg total) by mouth daily.   . ondansetron (ZOFRAN) 8 MG tablet Take 1 tab po twice a daily as needed for nausea   . ONE TOUCH ULTRA TEST test strip    . phentermine (ADIPEX-P) 37.5 MG tablet Take 1 tablet (37.5 mg total) by mouth daily before breakfast.   . TRULICITY 1.5 TI/4.5YK SOPN INJECT 1.5MG  ONCE A WEEK   . zolpidem (AMBIEN) 10 MG tablet Take 1 tablet (10 mg total) by mouth at bedtime as needed for sleep.   . [DISCONTINUED] atorvastatin (LIPITOR) 10 MG tablet Take 1/2 tab po  Daily for high cholesterol   . [DISCONTINUED] bisoprolol (ZEBETA) 5 MG tablet Take 1 tablet (5 mg total) by mouth at bedtime.   . [DISCONTINUED] ondansetron (ZOFRAN) 8 MG tablet Take 1 tab po twice a daily as needed for nausea   . [DISCONTINUED] phentermine (ADIPEX-P) 37.5 MG tablet Take 1 tablet (37.5 mg total) by mouth daily before breakfast.   . benzonatate (TESSALON) 200 MG capsule Take 1 capsule (200 mg total) by mouth 2 (two) times daily as needed for cough. (Patient not taking: Reported on 08/21/2018)   . metFORMIN (GLUCOPHAGE-XR) 750 MG 24 hr tablet Take  by mouth. 05/14/2016: Received from: [place]: Take 750 mg by mouth 2 (two) times daily.  . rifaximin (XIFAXAN) 550 MG TABS tablet Take 1 tablet (550 mg total) by mouth 2 (two) times daily. (Patient not taking: Reported on 08/21/2018)    No facility-administered encounter medications on file as of 08/21/2018.     Surgical History: Past Surgical History:  Procedure Laterality Date  . BREAST BIOPSY Right 2012   cleaned out fistula, not a biopsy  . CHOLECYSTECTOMY    . COLONOSCOPY WITH PROPOFOL N/A 07/07/2018   Procedure: COLONOSCOPY WITH  PROPOFOL;  Surgeon: Lollie Sails, MD;  Location: [facility] ENDOSCOPY;  Service: Endoscopy;  Laterality: N/A;  . SHOULDER ARTHROSCOPY      Medical History: Past Medical History:  Diagnosis Date  . Diabetes mellitus without complication ([place])   . GERD (gastroesophageal reflux disease)   . Hyperlipidemia   . Hypertension   . Lateral epicondylitis of right elbow     Family History: Family History  Problem Relation Age of Onset  . Lung cancer Mother   . Colon cancer Father   . Breast cancer Neg Hx     Social History   Socioeconomic History  . Marital status: Married    Spouse name: Not on file  . Number of children: Not on file  . Years of education: Not on file  . Highest education level: Not on file  Occupational History  . Not on file  Social Needs  . Financial resource strain: Not on file  . Food insecurity:    Worry: Not on file    Inability: Not on file  . Transportation needs:    Medical: Not on file    Non-medical: Not on file  Tobacco Use  . Smoking status: Never Smoker  . Smokeless tobacco: Never Used  Substance and Sexual Activity  . Alcohol use: Yes    Frequency: Never    Comment: socially  . Drug use: No  . Sexual activity: Not on file  Lifestyle  . Physical activity:    Days per week: Not on file    Minutes per session: Not on file  . Stress: Not on file  Relationships  . Social connections:    Talks on phone: Not on file    Gets together: Not on file    Attends religious service: Not on file    Active member of club or organization: Not on file    Attends meetings of clubs or organizations: Not on file    Relationship status: Not on file  . Intimate partner violence:    Fear of current or ex partner: Not on file    Emotionally abused: Not on file    Physically abused: Not on file    Forced sexual activity: Not on file  Other Topics Concern  . Not on file  Social History Narrative  . Not on file      Review of Systems   Constitutional: Negative for activity change, appetite change, chills, fatigue, fever and unexpected weight change.       Recently started to have some weight gain.   HENT: Negative for congestion, ear pain, facial swelling, hearing loss, postnasal drip, rhinorrhea, sinus pressure, sinus pain, sore throat, trouble swallowing and voice change.   Respiratory: Negative for apnea, cough, chest tightness, shortness of breath, wheezing and stridor.   Cardiovascular: Negative for chest pain, palpitations and leg swelling.  Gastrointestinal: Negative for abdominal distention, abdominal pain, constipation,  diarrhea, nausea, rectal pain and vomiting.  Endocrine: Negative for cold intolerance, heat intolerance, polydipsia and polyuria.       Improved blood sugars  Genitourinary: Negative for decreased urine volume, dysuria, flank pain, frequency, genital sores, hematuria, pelvic pain, urgency, vaginal discharge and vaginal pain.  Allergic/Immunologic: Positive for environmental allergies.       Takes Zyrtec as needed  Neurological: Negative for dizziness and headaches.  Hematological: Negative for adenopathy. Does not bruise/bleed easily.  Psychiatric/Behavioral: Negative for agitation and dysphoric mood. The patient is nervous/anxious.     Today's Vitals   08/21/18 1423  Pulse: 78  Resp: 16  Weight: 158 lb (71.7 kg)  Height: 5\' 4"  (1.626 m)   Body mass index is 27.12 kg/m.  Observation/Objective:  Assessment/Plan: 1. Uncontrolled type 2 diabetes mellitus with hyperglycemia ([place]) Advised patient to adjust insulin dosing as indicated to better control blood sugars.   2. Benign hypertension Stable. Continue bp medication as prescribed   3. Abnormal weight gain May restart phentermine 37.5mg  daily. Recommend low calorie diet and incorporation of exercise ito daily routine.  - phentermine (ADIPEX-P) 37.5 MG tablet; Take 1 tablet (37.5 mg total) by mouth daily before breakfast.  Dispense: 30  tablet; Refill: 2  4. Nausea May take zofran 8mg  twice daily as needed for nausea.  - ondansetron (ZOFRAN) 8 MG tablet; Take 1 tab po twice a daily as needed for nausea  Dispense: 60 tablet; Refill: 2  General Counseling: breyanna valera understanding of the findings of today's phone visit and agrees with plan of treatment. I have discussed any further diagnostic evaluation that may be needed or ordered today. We also reviewed her medications today. she has been encouraged to call the office with any questions or concerns that should arise related to todays visit.  Diabetes Counseling:  1. Addition of ACE inh/ ARB'S for nephroprotection. Microalbumin is updated  2. Diabetic foot care, prevention of complications. Podiatry consult 3. Exercise and lose weight.  4. Diabetic eye examination, Diabetic eye exam is updated  5. Monitor blood sugar closlely. nutrition counseling.  6. Sign and symptoms of hypoglycemia including shaking sweating,confusion and headaches.  This patient was seen by [place] with Dr Lavera Guise as a part of collaborative care agreement  Meds ordered this encounter  Medications  . phentermine (ADIPEX-P) 37.5 MG tablet    Sig: Take 1 tablet (37.5 mg total) by mouth daily before breakfast.    Dispense:  30 tablet    Refill:  2    Order Specific Question:   Supervising Provider    Answer:   Lavera Guise [place]  . ondansetron (ZOFRAN) 8 MG tablet    Sig: Take 1 tab po twice a daily as needed for nausea    Dispense:  60 tablet    Refill:  2    Order Specific Question:   Supervising Provider    Answer:   Lavera Guise [[id number]]    Time spent: 39 Minutes    Dr Lavera Guise Internal medicine

## 2018-08-22 ENCOUNTER — Other Ambulatory Visit: Payer: Self-pay | Admitting: Nurse Practitioner

## 2018-08-22 MED ORDER — ATORVASTATIN CALCIUM 10 MG PO TABS: ORAL_TABLET | ORAL | 5 refills | Status: DC

## 2018-08-22 MED ORDER — BISOPROLOL FUMARATE 5 MG PO TABS: 5.0000 mg | ORAL_TABLET | Freq: Every day | ORAL | 5 refills | Status: DC

## 2018-08-26 DIAGNOSIS — R110 Nausea: Secondary | ICD-10-CM | POA: Insufficient documentation

## 2018-08-30 ENCOUNTER — Encounter: Payer: Self-pay | Admitting: Nurse Practitioner

## 2018-09-09 ENCOUNTER — Other Ambulatory Visit: Payer: Self-pay | Admitting: Internal Medicine

## 2018-09-09 DIAGNOSIS — F5101 Primary insomnia: Secondary | ICD-10-CM

## 2018-09-12 ENCOUNTER — Encounter: Attending: Neurological Surgery | Primary: Internal Medicine

## 2018-10-03 ENCOUNTER — Other Ambulatory Visit: Payer: Self-pay

## 2018-10-03 DIAGNOSIS — N390 Urinary tract infection, site not specified: Secondary | ICD-10-CM

## 2018-10-03 MED ORDER — ESTROGENS, CONJUGATED 0.625 MG/GM VA CREA: TOPICAL_CREAM | VAGINAL | 1 refills | Status: DC

## 2018-10-03 MED ORDER — CYCLOBENZAPRINE HCL 10 MG PO TABS: 10.0000 mg | ORAL_TABLET | Freq: Every day | ORAL | 1 refills | Status: DC

## 2018-10-04 ENCOUNTER — Other Ambulatory Visit: Payer: Self-pay | Admitting: Internal Medicine

## 2018-10-04 MED ORDER — INSULIN PEN NEEDLE 32G X 4 MM MISC: 1 refills | Status: DC

## 2018-10-20 ENCOUNTER — Other Ambulatory Visit: Payer: Self-pay

## 2018-10-20 ENCOUNTER — Encounter: Payer: Self-pay | Admitting: Nurse Practitioner

## 2018-10-20 ENCOUNTER — Other Ambulatory Visit: Payer: Self-pay | Admitting: Nurse Practitioner

## 2018-10-20 ENCOUNTER — Ambulatory Visit (INDEPENDENT_AMBULATORY_CARE_PROVIDER_SITE_OTHER): Payer: BC Managed Care – PPO | Admitting: Nurse Practitioner

## 2018-10-20 VITALS — HR 78 | Ht 64.0 in | Wt 151.0 lb

## 2018-10-20 DIAGNOSIS — J0140 Acute pansinusitis, unspecified: Secondary | ICD-10-CM | POA: Insufficient documentation

## 2018-10-20 DIAGNOSIS — I10 Essential (primary) hypertension: Secondary | ICD-10-CM | POA: Diagnosis not present

## 2018-10-20 DIAGNOSIS — E1165 Type 2 diabetes mellitus with hyperglycemia: Secondary | ICD-10-CM

## 2018-10-20 MED ORDER — SULFAMETHOXAZOLE-TRIMETHOPRIM 800-160 MG PO TABS: 1.0000 | ORAL_TABLET | Freq: Two times a day (BID) | ORAL | 0 refills | Status: DC

## 2018-10-20 MED ORDER — LINZESS 145 MCG PO CAPS: 145.0000 ug | ORAL_CAPSULE | Freq: Every day | ORAL | 3 refills | Status: DC

## 2018-10-20 NOTE — Progress Notes (Signed)
[facility] [place], [place] [address]  Internal MEDICINE  Telephone Visit  Patient Name: Chelsea Rivera  [id number]  [id number]  Date of Service: 10/20/2018  I connected with the patient at 12:44pm  by telephone and verified the patients identity using two identifiers.   I discussed the limitations, risks, security and privacy concerns of performing an evaluation and management service by telephone and the availability of in person appointments. I also discussed with the patient that there may be a patient responsible charge related to the service.  The patient expressed understanding and agrees to proceed.    Chief Complaint  Patient presents with  . Telephone Screen    VIDEO VISIT [phone]  . Telephone Assessment  . Sinusitis    sinus infection, feels pain in glands and going straight to her ear, no fever, chills or body aches feels it all in her head, pt has not been around anyone sick that she know of.    .The patient has been contacted via webcam for follow up visit due to concerns for spread of novel coronavirus. Today, she is complaining about sinus pressure in maxillary and frontal regions. She is having headache and post nasal drip. She states that ears are hurting and feel as though they have pressure behind them. She denies fever or chills. Has no nausea or vomiting. She denies exposure to COVID 19 to her knowledge.       Current Medication: Outpatient Encounter Medications as of 10/20/2018  Medication Sig Note  . allopurinol (ZYLOPRIM) 100 MG tablet Take by mouth. 05/14/2016: Received from: [place]: Take 100 mg by mouth.  Marland Kitchen aspirin EC 81 MG tablet Take 81 mg by mouth daily.   Marland Kitchen atorvastatin (LIPITOR) 10 MG tablet Take 1/2 tab po  Daily for high cholesterol   . bisoprolol (ZEBETA) 5 MG tablet Take 1 tablet (5 mg total) by mouth at bedtime.   . conjugated estrogens (PREMARIN) vaginal cream 1 applicator  vaginally twice weekly   . cyclobenzaprine (FLEXERIL) 10 MG tablet Take 1 tablet (10 mg total) by mouth at bedtime.   . furosemide (LASIX) 20 MG tablet Take 1 tablet (20 mg total) by mouth daily as needed.   . Insulin Detemir (LEVEMIR FLEXTOUCH) 100 UNIT/ML Pen 30 units at bedtime   . Insulin Pen Needle (BD PEN NEEDLE NANO U/F) 32G X 4 MM MISC Use as directed   . LINZESS 145 MCG CAPS capsule Take 1 capsule (145 mcg total) by mouth daily.   Marland Kitchen lisinopril (PRINIVIL,ZESTRIL) 2.5 MG tablet TAKE ONE TAB EVERY DAY   . metFORMIN (GLUCOPHAGE) 1000 MG tablet Take 1 tablet (1,000 mg total) by mouth 2 (two) times daily.   . metFORMIN (GLUCOPHAGE-XR) 750 MG 24 hr tablet Take by mouth. 05/14/2016: Received from: [place]: Take 750 mg by mouth 2 (two) times daily.  Marland Kitchen omeprazole (PRILOSEC) 40 MG capsule Take 1 capsule (40 mg total) by mouth daily.   . ondansetron (ZOFRAN) 8 MG tablet Take 1 tab po twice a daily as needed for nausea   . ONETOUCH ULTRA test strip TEST BLOOD SUGAR 3 TIMES DAILY AS DIRECTED   . phentermine (ADIPEX-P) 37.5 MG tablet Take 1 tablet (37.5 mg total) by mouth daily before breakfast.   . sulfamethoxazole-trimethoprim (BACTRIM DS) 800-160 MG tablet Take 1 tablet by mouth 2 (two) times daily.   . TRULICITY 1.5 IW/9.7LG SOPN INJECT 1.5MG  ONCE A WEEK   .  zolpidem (AMBIEN) 10 MG tablet TAKE ONE TABLET BY MOUTH AT BEDTIME AS NEEDED   . [DISCONTINUED] sulfamethoxazole-trimethoprim (BACTRIM DS) 800-160 MG tablet Take 1 tablet by mouth 2 (two) times daily.   . benzonatate (TESSALON) 200 MG capsule Take 1 capsule (200 mg total) by mouth 2 (two) times daily as needed for cough. (Patient not taking: Reported on 08/21/2018)   . rifaximin (XIFAXAN) 550 MG TABS tablet Take 1 tablet (550 mg total) by mouth 2 (two) times daily. (Patient not taking: Reported on 08/21/2018)    No facility-administered encounter medications on file as of 10/20/2018.     Surgical History: Past  Surgical History:  Procedure Laterality Date  . BREAST BIOPSY Right 2012   cleaned out fistula, not a biopsy  . CHOLECYSTECTOMY    . COLONOSCOPY WITH PROPOFOL N/A 07/07/2018   Procedure: COLONOSCOPY WITH PROPOFOL;  Surgeon: Lollie Sails, MD;  Location: [facility] ENDOSCOPY;  Service: Endoscopy;  Laterality: N/A;  . SHOULDER ARTHROSCOPY      Medical History: Past Medical History:  Diagnosis Date  . Diabetes mellitus without complication ([place])   . GERD (gastroesophageal reflux disease)   . Hyperlipidemia   . Hypertension   . Lateral epicondylitis of right elbow     Family History: Family History  Problem Relation Age of Onset  . Lung cancer Mother   . Colon cancer Father   . Breast cancer Neg Hx     Social History   Socioeconomic History  . Marital status: Married    Spouse name: Not on file  . Number of children: Not on file  . Years of education: Not on file  . Highest education level: Not on file  Occupational History  . Not on file  Social Needs  . Financial resource strain: Not on file  . Food insecurity    Worry: Not on file    Inability: Not on file  . Transportation needs    Medical: Not on file    Non-medical: Not on file  Tobacco Use  . Smoking status: Never Smoker  . Smokeless tobacco: Never Used  Substance and Sexual Activity  . Alcohol use: Yes    Frequency: Never    Comment: socially  . Drug use: No  . Sexual activity: Not on file  Lifestyle  . Physical activity    Days per week: Not on file    Minutes per session: Not on file  . Stress: Not on file  Relationships  . Social Herbalist on phone: Not on file    Gets together: Not on file    Attends religious service: Not on file    Active member of club or organization: Not on file    Attends meetings of clubs or organizations: Not on file    Relationship status: Not on file  . Intimate partner violence    Fear of current or ex partner: Not on file    Emotionally abused: Not on  file    Physically abused: Not on file    Forced sexual activity: Not on file  Other Topics Concern  . Not on file  Social History Narrative  . Not on file      Review of Systems  Constitutional: Positive for chills and fatigue. Negative for fever.  HENT: Positive for congestion, ear pain, postnasal drip, rhinorrhea, sinus pressure and sinus pain. Negative for sore throat and voice change.   Respiratory: Negative for cough and wheezing.   Cardiovascular: Negative for  chest pain and palpitations.  Gastrointestinal: Negative for nausea and vomiting.  Neurological: Positive for headaches.  Hematological: Positive for adenopathy.    Today's Vitals   10/20/18 1228  Pulse: 78  Weight: 151 lb (68.5 kg)  Height: 5\' 4"  (1.626 m)   Body mass index is 25.92 kg/m.   Observation/Objective:   The patient is alert and oriented. She is pleasant and answers all questions appropriately. Breathing is non-labored. She is in no acute distress at this time. She is nasally congested and appears as though she doesn't feel well.    Assessment/Plan:  1. Acute non-recurrent pansinusitis Start bactrim ds bid for 10 days. Recommended tylenol sinus or similar OTC medication to imrove acute symptoms.  - sulfamethoxazole-trimethoprim (BACTRIM DS) 800-160 MG tablet; Take 1 tablet by mouth 2 (two) times daily.  Dispense: 20 tablet; Refill: 0  2. Benign hypertension Stable.   3. Uncontrolled type 2 diabetes mellitus with hyperglycemia ([place]) Continue diabetic medication as prescribed   General Counseling: lexy meininger understanding of the findings of today's phone visit and agrees with plan of treatment. I have discussed any further diagnostic evaluation that may be needed or ordered today. We also reviewed her medications today. she has been encouraged to call the office with any questions or concerns that should arise related to todays visit.  Rest and increase fluids. Continue using OTC  medication to control symptoms.   This patient was seen by [place] with Dr Lavera Guise as a part of collaborative care agreement  Meds ordered this encounter  Medications  . sulfamethoxazole-trimethoprim (BACTRIM DS) 800-160 MG tablet    Sig: Take 1 tablet by mouth 2 (two) times daily.    Dispense:  20 tablet    Refill:  0    Order Specific Question:   Supervising Provider    Answer:   Lavera Guise [[id number]]    Time spent: 82 Minutes    Dr Lavera Guise Internal medicine

## 2018-10-20 NOTE — Progress Notes (Signed)
Sent bactrim DS bid for 10 [facility]. Marland Kitchen

## 2018-11-06 ENCOUNTER — Other Ambulatory Visit: Payer: Self-pay

## 2018-11-06 MED ORDER — TRULICITY 1.5 MG/0.5ML ~~LOC~~ SOAJ: SUBCUTANEOUS | 3 refills | Status: DC

## 2018-11-20 ENCOUNTER — Other Ambulatory Visit: Payer: Self-pay

## 2018-11-20 MED ORDER — METFORMIN HCL 1000 MG PO TABS: 1000.0000 mg | ORAL_TABLET | Freq: Two times a day (BID) | ORAL | 3 refills | Status: DC

## 2018-11-23 ENCOUNTER — Ambulatory Visit (INDEPENDENT_AMBULATORY_CARE_PROVIDER_SITE_OTHER): Payer: BC Managed Care – PPO | Admitting: Nurse Practitioner

## 2018-11-23 ENCOUNTER — Other Ambulatory Visit: Payer: Self-pay

## 2018-11-23 ENCOUNTER — Encounter: Payer: Self-pay | Admitting: Nurse Practitioner

## 2018-11-23 VITALS — BP 122/80 | HR 77 | Resp 16 | Ht 64.0 in | Wt 153.8 lb

## 2018-11-23 DIAGNOSIS — R635 Abnormal weight gain: Secondary | ICD-10-CM | POA: Diagnosis not present

## 2018-11-23 DIAGNOSIS — I10 Essential (primary) hypertension: Secondary | ICD-10-CM

## 2018-11-23 DIAGNOSIS — E1165 Type 2 diabetes mellitus with hyperglycemia: Secondary | ICD-10-CM

## 2018-11-23 LAB — POCT GLYCOSYLATED HEMOGLOBIN (HGB A1C): Hemoglobin A1C: 8.7 % — AB (ref 4.0–5.6)

## 2018-11-23 MED ORDER — PHENTERMINE HCL 37.5 MG PO TABS: 37.5000 mg | ORAL_TABLET | Freq: Every day | ORAL | 2 refills | Status: DC

## 2018-11-23 NOTE — Progress Notes (Signed)
[facility], [place] [address]  Internal MEDICINE  Office Visit Note  Patient Name: Chelsea Rivera  [id number]  [id number]  Date of Service: 11/23/2018  Chief Complaint  Patient presents with  . Medical Management of Chronic Issues    3 month follow up  . Diabetes    A1C    The patient is here for routine follow up exam. Blood sugars are more elevated these past few months. Has been eating a great deal of fresh foods, especially fruit. She has also cut her insulin back to 25units daily. Has been taking phentermine every other day which has helped to decrease her cravings for sweets. She is exercising four to five times per week. She has lost five pounds since her last in-office visit. She feels good. Blood pressure is well managed. She has no concerns or complinats today.       Current Medication: Outpatient Encounter Medications as of 11/23/2018  Medication Sig Note  . allopurinol (ZYLOPRIM) 100 MG tablet Take by mouth. 05/14/2016: Received from: [place]: Take 100 mg by mouth.  Marland Kitchen aspirin EC 81 MG tablet Take 81 mg by mouth daily.   Marland Kitchen atorvastatin (LIPITOR) 10 MG tablet Take 1/2 tab po  Daily for high cholesterol   . bisoprolol (ZEBETA) 5 MG tablet Take 1 tablet (5 mg total) by mouth at bedtime.   . conjugated estrogens (PREMARIN) vaginal cream 1 applicator vaginally twice weekly   . cyclobenzaprine (FLEXERIL) 10 MG tablet Take 1 tablet (10 mg total) by mouth at bedtime.   . Dulaglutide (TRULICITY) 1.60 YO/3.7CH SOPN INJECT 1.5MG  ONCE A WEEK   . furosemide (LASIX) 20 MG tablet Take 1 tablet (20 mg total) by mouth daily as needed.   . Insulin Detemir (LEVEMIR FLEXTOUCH) 100 UNIT/ML Pen 30 units at bedtime   . Insulin Pen Needle (BD PEN NEEDLE NANO U/F) 32G X 4 MM MISC Use as directed   . LINZESS 145 MCG CAPS capsule Take 1 capsule (145 mcg total) by mouth daily.   Marland Kitchen lisinopril (PRINIVIL,ZESTRIL) 2.5 MG tablet TAKE  ONE TAB EVERY DAY   . metFORMIN (GLUCOPHAGE) 1000 MG tablet Take 1 tablet (1,000 mg total) by mouth 2 (two) times daily.   . metFORMIN (GLUCOPHAGE-XR) 750 MG 24 hr tablet Take by mouth. 05/14/2016: Received from: [place]: Take 750 mg by mouth 2 (two) times daily.  Marland Kitchen omeprazole (PRILOSEC) 40 MG capsule Take 1 capsule (40 mg total) by mouth daily.   . ondansetron (ZOFRAN) 8 MG tablet Take 1 tab po twice a daily as needed for nausea   . ONETOUCH ULTRA test strip TEST BLOOD SUGAR 3 TIMES DAILY AS DIRECTED   . phentermine (ADIPEX-P) 37.5 MG tablet Take 1 tablet (37.5 mg total) by mouth daily before breakfast.   . sulfamethoxazole-trimethoprim (BACTRIM DS) 800-160 MG tablet Take 1 tablet by mouth 2 (two) times daily.   Marland Kitchen zolpidem (AMBIEN) 10 MG tablet TAKE ONE TABLET BY MOUTH AT BEDTIME AS NEEDED   . [DISCONTINUED] phentermine (ADIPEX-P) 37.5 MG tablet Take 1 tablet (37.5 mg total) by mouth daily before breakfast.   . benzonatate (TESSALON) 200 MG capsule Take 1 capsule (200 mg total) by mouth 2 (two) times daily as needed for cough. (Patient not taking: Reported on 08/21/2018)   . rifaximin (XIFAXAN) 550 MG TABS tablet Take 1 tablet (550 mg total) by mouth 2 (two) times daily. (Patient not taking: Reported on  08/21/2018)    No facility-administered encounter medications on file as of 11/23/2018.     Surgical History: Past Surgical History:  Procedure Laterality Date  . BREAST BIOPSY Right 2012   cleaned out fistula, not a biopsy  . CHOLECYSTECTOMY    . COLONOSCOPY WITH PROPOFOL N/A 07/07/2018   Procedure: COLONOSCOPY WITH PROPOFOL;  Surgeon: Lollie Sails, MD;  Location: [facility] ENDOSCOPY;  Service: Endoscopy;  Laterality: N/A;  . SHOULDER ARTHROSCOPY      Medical History: Past Medical History:  Diagnosis Date  . Diabetes mellitus without complication ([place])   . GERD (gastroesophageal reflux disease)   . Hyperlipidemia   . Hypertension   . Lateral  epicondylitis of right elbow     Family History: Family History  Problem Relation Age of Onset  . Lung cancer Mother   . Colon cancer Father   . Breast cancer Neg Hx     Social History   Socioeconomic History  . Marital status: Married    Spouse name: Not on file  . Number of children: Not on file  . Years of education: Not on file  . Highest education level: Not on file  Occupational History  . Not on file  Social Needs  . Financial resource strain: Not on file  . Food insecurity    Worry: Not on file    Inability: Not on file  . Transportation needs    Medical: Not on file    Non-medical: Not on file  Tobacco Use  . Smoking status: Never Smoker  . Smokeless tobacco: Never Used  Substance and Sexual Activity  . Alcohol use: Yes    Frequency: Never    Comment: socially  . Drug use: No  . Sexual activity: Not on file  Lifestyle  . Physical activity    Days per week: Not on file    Minutes per session: Not on file  . Stress: Not on file  Relationships  . Social Herbalist on phone: Not on file    Gets together: Not on file    Attends religious service: Not on file    Active member of club or organization: Not on file    Attends meetings of clubs or organizations: Not on file    Relationship status: Not on file  . Intimate partner violence    Fear of current or ex partner: Not on file    Emotionally abused: Not on file    Physically abused: Not on file    Forced sexual activity: Not on file  Other Topics Concern  . Not on file  Social History Narrative  . Not on file      Review of Systems  Constitutional: Negative for activity change, appetite change, chills, fatigue, fever and unexpected weight change.       Five pound weight loss since her last visit .  HENT: Negative for congestion, ear pain, facial swelling, hearing loss, postnasal drip, rhinorrhea, sinus pressure, sinus pain, sore throat, trouble swallowing and voice change.    Respiratory: Negative for apnea, cough, chest tightness, shortness of breath, wheezing and stridor.   Cardiovascular: Negative for chest pain and palpitations.  Gastrointestinal: Positive for constipation. Negative for abdominal distention, abdominal pain, diarrhea, nausea, rectal pain and vomiting.  Endocrine: Negative for cold intolerance, heat intolerance, polydipsia and polyuria.       Blood sugars have been more elevated recently.   Allergic/Immunologic: Positive for environmental allergies.       Takes  Zyrtec as needed  Neurological: Negative for dizziness and headaches.  Hematological: Negative for adenopathy. Does not bruise/bleed easily.  Psychiatric/Behavioral: Negative for agitation and dysphoric mood. The patient is nervous/anxious.     Today's Vitals   11/23/18 0907  BP: 122/80  Pulse: 77  Resp: 16  SpO2: 99%  Weight: 153 lb 12.8 oz (69.8 kg)  Height: 5\' 4"  (1.626 m)   Body mass index is 26.4 kg/m.  Physical Exam Vitals signs and nursing note reviewed.  Constitutional:      General: She is not in acute distress.    Appearance: Normal appearance. She is well-developed. She is not diaphoretic.  HENT:     Head: Normocephalic and atraumatic.     Mouth/Throat:     Pharynx: No oropharyngeal exudate.  Eyes:     Extraocular Movements: Extraocular movements intact.     Pupils: Pupils are equal, round, and reactive to light.  Neck:     Musculoskeletal: Normal range of motion and neck supple.     Thyroid: No thyromegaly.     Vascular: No JVD.     Trachea: No tracheal deviation.  Cardiovascular:     Rate and Rhythm: Normal rate and regular rhythm.     Heart sounds: Normal heart sounds. No murmur. No friction rub. No gallop.   Pulmonary:     Effort: Pulmonary effort is normal. No respiratory distress.     Breath sounds: Normal breath sounds. No wheezing or rales.  Chest:     Chest wall: No tenderness.  Abdominal:     General: Bowel sounds are normal.      Palpations: Abdomen is soft.  Musculoskeletal: Normal range of motion.  Lymphadenopathy:     Cervical: No cervical adenopathy.  Skin:    General: Skin is warm and dry.  Neurological:     Mental Status: She is alert and oriented to person, place, and time.     Cranial Nerves: No cranial nerve deficit.  Psychiatric:        Behavior: Behavior normal.        Thought Content: Thought content normal.        Judgment: Judgment normal.    Assessment/Plan: 1. Uncontrolled type 2 diabetes mellitus with hyperglycemia (HCC) - POCT HgB A1C 8.7 today. Encouraged the patient to increase insulin, gradually, back to 30 units daily. Limit intake of sugars, including naturally occurring sugars in fruit. Continue with other healthy lifestyle changes including increased exercise.   2. Benign hypertension Well controlled. Continue bp medication as prescribed  3. Abnormal weight gain May continue phentermine. Taking this only every other day. Limit calorie intake to 1200-1500 calories per day and continue with regular exercise.  - phentermine (ADIPEX-P) 37.5 MG tablet; Take 1 tablet (37.5 mg total) by mouth daily before breakfast.  Dispense: 30 tablet; Refill: 2  General Counseling: nadja lina understanding of the findings of todays visit and agrees with plan of treatment. I have discussed any further diagnostic evaluation that may be needed or ordered today. We also reviewed her medications today. she has been encouraged to call the office with any questions or concerns that should arise related to todays visit.  Diabetes Counseling:  1. Addition of ACE inh/ ARB'S for nephroprotection. Microalbumin is updated  2. Diabetic foot care, prevention of complications. Podiatry consult 3. Exercise and lose weight.  4. Diabetic eye examination, Diabetic eye exam is updated  5. Monitor blood sugar closlely. nutrition counseling.  6. Sign and symptoms of hypoglycemia including shaking sweating,confusion  and  headaches.  This patient was seen by Leretha Pol FNP Collaboration with Dr Lavera Guise as a part of collaborative care agreement  Orders Placed This Encounter  Procedures  . POCT HgB A1C    Meds ordered this encounter  Medications  . phentermine (ADIPEX-P) 37.5 MG tablet    Sig: Take 1 tablet (37.5 mg total) by mouth daily before breakfast.    Dispense:  30 tablet    Refill:  2    Please place on hold until patient ready for refill.    Order Specific Question:   Supervising Provider    Answer:   Lavera Guise [[id number]]    Time spent: 4 Minutes      Dr Lavera Guise Internal medicine

## 2018-12-14 NOTE — Telephone Encounter (Signed)
I reviewed the MRI report which is describing post-op changes without obvious recurrence. I tried to call office with no answer. The patient may f/u with me in next few weeks and please have her bring the CD from [facility] MRI for me to see.  Kelly Miles

## 2018-12-14 NOTE — Telephone Encounter (Signed)
Dr. Carolann Littler office called they would like Dr. Christiana Fuchs to give them a call regarding the abnormal results to advise for swelling they will be faxing results to the office please call [phone] or [phone] the patient is scheduled for an office visit on 12/19/2018 at 11:45 AM with Dr. Christiana Fuchs

## 2018-12-14 NOTE — Telephone Encounter (Signed)
I called the patient and advised per Dr. Christiana Fuchs that she can f/u in the next few weeks and to bring the disc from [facility] with her to her appointment.    I reminded the patient that she is already scheduled for a follow-up on 12/19/18 and asked that she bring the disc with her to that appointment. I asked that the patient return my call to confirm that she received my message.

## 2018-12-15 NOTE — Telephone Encounter (Signed)
Patient called back and verbalized understanding of the message that [facility] left.

## 2018-12-18 NOTE — Telephone Encounter (Signed)
Spoke with patient and reminded her of her appointment with Dr. Christiana Fuchs 12/19/2018 at 1145. Patient confirmed appointment with me.  Reminded patient to bring imaging she wants to show Dr. Christiana Fuchs on a disc. Patient states it was of the brain and done at [facility]. Reminded patient to wear a mask to appointment. Patient verbalizes understanding.

## 2018-12-19 ENCOUNTER — Ambulatory Visit
Admit: 2018-12-19 | Discharge: 2018-12-19 | Payer: MEDICAID | Attending: Neurological Surgery | Primary: Internal Medicine

## 2018-12-19 DIAGNOSIS — D329 Benign neoplasm of meninges, unspecified: Secondary | ICD-10-CM

## 2018-12-19 NOTE — Progress Notes (Signed)
NEUROSURGERY and SPINE FOLLOW-UP NOTE      Patient Name: SHAHED Miles  Patient DOB: April 09, 1961  MRN: [id number]       PCP: Jeanett Schlein, MD      History of Present Ilness: 58 y.o. presents with episode of balance difficulty, facial droop, and difficulty talking about a month ago which lasted for about 20 minutes. This happened after watching TV and standing up. She denies shaking in her arms or legs, no LOC. She went to bed and the next morning felt better although felt very tired and had blurry vision. These symptoms have resolved and she had a Brain MRI and MRA for TIA workup and currently has a heart monitor for heart palpitations. She has not had any further episodes. She still continues to get headaches. She has had several shoulder steroid injections for bursitis which helped.       Chief Complaint   Patient presents with   ??? Follow-up     Had abnormal MRI, sent by Dr. Anitra Lauth        Past Medical History:        Diagnosis Date   ??? Allergic rhinitis    ??? Breast cancer ([place])    ??? Chronic back pain    ??? Colitis    ??? Fibromyalgia    ??? Hypertension    ??? Hypothyroidism    ??? Irritable bowel syndrome    ??? Neuropathy    ??? Osteoarthritis     back   ??? Restless legs syndrome        Past Surgical History:        Procedure Laterality Date   ??? BREAST SURGERY     ??? CHOLECYSTECTOMY     ??? COLONOSCOPY     ??? CRANIAL LESION RESECTION  2019   ??? INNER EAR SURGERY      both sides   ??? TONSILLECTOMY AND ADENOIDECTOMY         Home Medications:   Prior to Admission medications    Medication Sig Start Date End Date Taking? Authorizing Provider   traMADol (ULTRAM) 50 MG tablet Take 50 mg by mouth nightly.   Yes Historical Provider, MD   topiramate (TOPAMAX) 100 MG tablet Take 100 mg by mouth 2 times daily   Yes Historical Provider, MD   DIPHENOXYLATE-ATROPINE PO Take 2.5 mg by mouth 4 times daily For Diarrhea/Colitis   Yes Historical Provider, MD   irbesartan (AVAPRO) 300 MG tablet Take 300 mg by mouth daily    Yes Historical  Provider, MD   aspirin 81 MG chewable tablet Take 81 mg by mouth daily Patient stopped per Dr. Christiana Fuchs 10 days prior to surgery   Yes Historical Provider, MD   hydrochlorothiazide (HYDRODIURIL) 25 MG tablet Take 25 mg by mouth daily   Yes Historical Provider, MD   budesonide (ENTOCORT EC) 3 MG extended release capsule Take 6 mg by mouth every morning   Yes Historical Provider, MD   cholestyramine (QUESTRAN) 4 g packet Take 1 packet by mouth 2 times daily    Yes Historical Provider, MD   Menthol, Topical Analgesic, (BIOFREEZE COLORLESS EX) Apply topically as needed    Yes Historical Provider, MD   tiZANidine (ZANAFLEX) 4 MG tablet Take 4 mg by mouth 2 times daily  03/11/17  Yes Historical Provider, MD   Multiple Vitamins-Iron (TAB-A-VITE/IRON) TABS Take 1 tablet by mouth daily  03/01/17  Yes Historical Provider, MD   amitriptyline (ELAVIL) 10 MG tablet Take 30  mg by mouth nightly  02/16/17  Yes Historical Provider, MD   SYNTHROID 112 MCG tablet Take 112 mcg by mouth daily  06/24/14  Yes Historical Provider, MD   cetirizine (ZYRTEC) 10 MG tablet Take 10 mg by mouth daily  06/24/14  Yes Historical Provider, MD   vitamin D (CHOLECALCIFEROL) 1000 UNIT TABS tablet Take 2,000 Units by mouth daily    Yes Historical Provider, MD           Allergies: Adhesive tape; Ciprofloxacin; and Influenza vaccines    Social History:      TOBACCO:   reports that she quit smoking about 40 years ago. Her smoking use included cigarettes. She has a 0.25 pack-year smoking history. She has never used smokeless tobacco.  ETOH:   reports previous alcohol use.  RECREATIONAL DRUG USE:   Social History     Substance and Sexual Activity   Drug Use No       Family History:           Problem Relation Age of Onset   ??? Other Brother         allergies   ??? Brain Cancer Brother    ??? High Blood Pressure Mother    ??? Stroke Mother    ??? Heart Disease Father    ??? High Blood Pressure Sister    ??? Other Sister         allergies   ??? Mult Sclerosis Sister               Review of Systems:    Review of Systems   Constitutional: Negative for chills and fever.   HENT: Negative for congestion, rhinorrhea and sore throat.    Eyes: Negative for photophobia and visual disturbance.   Respiratory: Negative for cough and shortness of breath.    Cardiovascular: Positive for palpitations. Negative for chest pain.   Gastrointestinal: Negative for abdominal pain, nausea and vomiting.   Genitourinary: Negative for decreased urine volume and difficulty urinating.   Musculoskeletal: Negative for back pain, gait problem and neck pain.   Skin: Negative for rash and wound.   Neurological: Positive for headaches. Negative for seizures, speech difficulty and weakness.   Psychiatric/Behavioral: Negative for behavioral problems and confusion.         Physical Examination:    Vitals:    12/19/18 1150   BP: 102/65   Pulse: 83   Resp: 16   Temp: 97.7 ??F (36.5 ??C)       Physical Exam  Constitutional:       Appearance: Normal appearance.   HENT:      Head: Normocephalic and atraumatic.      Nose: Nose normal.      Mouth/Throat:      Mouth: Mucous membranes are moist.      Pharynx: Oropharynx is clear.   Eyes:      Extraocular Movements: Extraocular movements intact.      Conjunctiva/sclera: Conjunctivae normal.   Neck:      Musculoskeletal: Normal range of motion and neck supple. No muscular tenderness.   Cardiovascular:      Rate and Rhythm: Normal rate and regular rhythm.      Pulses: Normal pulses.   Pulmonary:      Effort: Pulmonary effort is normal. No respiratory distress.   Abdominal:      General: There is no distension.      Palpations: Abdomen is soft.      Tenderness: There is no abdominal  tenderness.   Musculoskeletal: Normal range of motion.         General: No tenderness.   Skin:     General: Skin is warm and dry.   Neurological:      General: No focal deficit present.      Mental Status: She is alert and oriented to person, place, and time.      Sensory: No sensory deficit.       Motor: No weakness.      Comments:   Motor strength 5/5 UE and LE  Sensation intact to light touch  DTR 2+  No facial droop  Speech clear  Cranial incision well healed  Cervical spine tenderness to palpation   Psychiatric:         Mood and Affect: Mood normal.         Behavior: Behavior normal.            Gait  normal      Results  Labs:  Last 24hrs  No results found for this or any previous visit (from the past 24 hour(s)).    Radiology Personal review:  MRI brain reveals expected post op changes with no residual or recurrent tumor.     ASSESSMENT / PLAN :     58 year old female about 9 months s/p left crani for meningioma resection. She had done well with improvement in her pre-op symptoms until recently she had a brief episode of balance difficulty, bilateral facial droop, and difficulty talking for about 20 minutes. MRI brain was unremarkable with expected post op changes. We are unsure what this latest episode was but it seems unlikely to be related to surgery 9 months ago and is nonspecific. Differential diagnosis includes TIA vs blood pressure or blood sugar disturbance. It does not sound like she had a seizure although that cannot be totally excluded. We would like her to see neurology for further evaluation. She was instructed not to drive until cleared by neurology. We will obtain a repeat MRI Brain with and without contrast in 1 year. She should see [place] back in a year, sooner if worsening complaints.     1. Meningioma [facility])  - [facility] - [facility]  - MRI Brain W WO Contrast; Future            Electronically signed by Delma Freeze, PA-C on 12/19/2018 at 12:16 PM

## 2018-12-21 ENCOUNTER — Telehealth: Payer: Self-pay

## 2018-12-21 MED ORDER — OMEPRAZOLE 40 MG PO CPDR: 40.0000 mg | DELAYED_RELEASE_CAPSULE | Freq: Every day | ORAL | 5 refills | Status: DC

## 2018-12-21 NOTE — Telephone Encounter (Signed)
Refilled pt omeprazole.

## 2018-12-22 ENCOUNTER — Ambulatory Visit: Admit: 2018-12-22 | Discharge: 2018-12-22 | Payer: MEDICAID | Attending: Neurology | Primary: Internal Medicine

## 2018-12-22 DIAGNOSIS — R569 Unspecified convulsions: Secondary | ICD-10-CM

## 2018-12-22 NOTE — Progress Notes (Signed)
Department of Neurological Sciences  Initial Consult Note     12/22/18       CHIEF COMPLAINT:   1.  Dysarthria, dizziness and right facial droop.     Reason for Consult:  Evaluate for possible focal seizure.    HISTORY OF PRESENT ILLNESS:     The patient is a 58 y.o.  female who presents with History of a left frontoparietal meningioma, were resected by Dr. Christiana Fuchs 9 months ago,, patient presented to the neurology clinic to be evaluated for a possible focal seizure.  The patient reports that approximately 1 month ago patient has a single episode of acute dizziness associated with dysarthria and right facial droop.  The entire episode lasted approximately 15-20 minutes and then resolve completely.  Patient was evaluated at the emergency room [facility] where she underwent acute distress evaluation.  Patient has an MRI and MRA of the brain.  The MRI revealed the presence of postsurgical changes in the left frontoparietal region.  There was no evidence of recurrence of the meningioma.  Patient was discharged home and told to follow with neurology.  Patient denies any other episodes of dysarthria and right facial droop.  In addition patient also has history of migraine headaches.  She patient denied losing consciousness.  She denies any urinary or fecal incontinence.  She denies any anterograde or retrograde amnesia.  The patient is back to normal.  She denies any new neurological deficit.Of remark is that the patient was taking Topamax 200 mg twice a day prior to the episode of dysarthria.      Past Medical History:    Past Medical History:   Diagnosis Date   ??? Allergic rhinitis    ??? Breast cancer ([place])    ??? Chronic back pain    ??? Colitis    ??? Fibromyalgia    ??? Hypertension    ??? Hypothyroidism    ??? Irritable bowel syndrome    ??? Neuropathy    ??? Osteoarthritis     back   ??? Restless legs syndrome         Past Surgical History:   Past Surgical History:   Procedure Laterality Date   ??? BREAST SURGERY     ???  CHOLECYSTECTOMY     ??? COLONOSCOPY     ??? CRANIAL LESION RESECTION  2019   ??? INNER EAR SURGERY      both sides   ??? TONSILLECTOMY AND ADENOIDECTOMY          Medications:   Current Outpatient Medications   Medication Sig Dispense Refill   ??? traMADol (ULTRAM) 50 MG tablet Take 50 mg by mouth nightly.     ??? topiramate (TOPAMAX) 100 MG tablet Take 100 mg by mouth 2 times daily     ??? DIPHENOXYLATE-ATROPINE PO Take 2.5 mg by mouth 4 times daily For Diarrhea/Colitis     ??? irbesartan (AVAPRO) 300 MG tablet Take 300 mg by mouth daily      ??? aspirin 81 MG chewable tablet Take 81 mg by mouth daily Patient stopped per Dr. Christiana Fuchs 10 days prior to surgery     ??? hydrochlorothiazide (HYDRODIURIL) 25 MG tablet Take 25 mg by mouth daily     ??? budesonide (ENTOCORT EC) 3 MG extended release capsule Take 6 mg by mouth every morning     ??? cholestyramine (QUESTRAN) 4 g packet Take 1 packet by mouth 2 times daily      ??? Menthol, Topical Analgesic, (BIOFREEZE COLORLESS  EX) Apply topically as needed      ??? tiZANidine (ZANAFLEX) 4 MG tablet Take 4 mg by mouth 2 times daily      ??? Multiple Vitamins-Iron (TAB-A-VITE/IRON) TABS Take 1 tablet by mouth daily      ??? amitriptyline (ELAVIL) 10 MG tablet Take 30 mg by mouth nightly      ??? SYNTHROID 112 MCG tablet Take 112 mcg by mouth daily      ??? cetirizine (ZYRTEC) 10 MG tablet Take 10 mg by mouth daily      ??? vitamin D (CHOLECALCIFEROL) 1000 UNIT TABS tablet Take 2,000 Units by mouth daily        No current facility-administered medications for this visit.         Allergies:  Iodine; Adhesive tape; Ciprofloxacin; Oxaprozin; Gabapentin; and Influenza vaccines    Social History:  Social History     Socioeconomic History   ??? Marital status: Divorced     Spouse name: Not on file   ??? Number of children: Not on file   ??? Years of education: Not on file   ??? Highest education level: Not on file   Occupational History   ??? Not on file   Social Needs   ??? Financial resource strain: Not on file   ??? Food  insecurity     Worry: Not on file     Inability: Not on file   ??? Transportation needs     Medical: Not on file     Non-medical: Not on file   Tobacco Use   ??? Smoking status: Former Smoker     Packs/day: 0.25     Years: 1.00     Pack years: 0.25     Types: Cigarettes     Last attempt to quit: 01/25/1978     Years since quitting: 40.9   ??? Smokeless tobacco: Never Used   Substance and Sexual Activity   ??? Alcohol use: Not Currently     Frequency: Monthly or less     Comment: Socially   ??? Drug use: No   ??? Sexual activity: Not on file   Lifestyle   ??? Physical activity     Days per week: Not on file     Minutes per session: Not on file   ??? Stress: Not on file   Relationships   ??? Social Product manager on phone: Not on file     Gets together: Not on file     Attends religious service: Not on file     Active member of club or organization: Not on file     Attends meetings of clubs or organizations: Not on file     Relationship status: Not on file   ??? Intimate partner violence     Fear of current or ex partner: Not on file     Emotionally abused: Not on file     Physically abused: Not on file     Forced sexual activity: Not on file   Other Topics Concern   ??? Not on file   Social History Narrative   ??? Not on file        Family History:   Family History   Problem Relation Age of Onset   ??? Other Brother         allergies   ??? Brain Cancer Brother    ??? High Blood Pressure Mother    ??? Stroke Mother    ??? Heart  Disease Father    ??? High Blood Pressure Sister    ??? Other Sister         allergies   ??? Mult Sclerosis Sister             REVIEW OF SYSTEMS:  Review of Systems   Constitutional: Negative for activity change, appetite change, chills and fever.   HENT: Positive for ear pain and tinnitus. Negative for dental problem, facial swelling, mouth sores, sinus pressure and sinus pain.    Eyes: Positive for visual disturbance. Negative for photophobia and pain.   Respiratory: Positive for chest tightness. Negative for cough.     Cardiovascular: Negative for chest pain.   Gastrointestinal: Negative for abdominal pain, nausea and vomiting.   Endocrine: Positive for cold intolerance. Negative for heat intolerance.   Genitourinary: Negative for difficulty urinating, hematuria and urgency.   Musculoskeletal: Positive for arthralgias and neck pain. Negative for gait problem, joint swelling and neck stiffness.   Skin: Negative for rash.   Allergic/Immunologic: Negative for environmental allergies and food allergies.   Neurological: Positive for numbness and headaches. Negative for dizziness, tremors, syncope, speech difficulty and weakness.   Hematological: Does not bruise/bleed easily.   Psychiatric/Behavioral: Negative for confusion, decreased concentration and hallucinations. The patient is nervous/anxious.         PHYSICAL EXAM:    Vitals:  BP 106/77    Pulse 87    Temp 97.3 ??F (36.3 ??C)    Resp 16    Ht 5\' 5"  (1.651 m)    Wt 152 lb 14.4 oz (69.4 kg)    SpO2 97%    BMI 25.44 kg/m??     Physical Exam  Vitals signs reviewed.   Constitutional:       General: She is not in acute distress.     Appearance: She is not ill-appearing.   HENT:      Head: Normocephalic.      Jaw: No tenderness, swelling or pain on movement.      Mouth/Throat:      Mouth: Mucous membranes are moist.   Eyes:      General: Lids are normal. Vision grossly intact. Gaze aligned appropriately. No visual field deficit or scleral icterus.     Extraocular Movements: Extraocular movements intact.      Right eye: Normal extraocular motion and no nystagmus.      Left eye: Normal extraocular motion and no nystagmus.      Conjunctiva/sclera: Conjunctivae normal.      Right eye: Right conjunctiva is not injected.      Pupils: Pupils are equal, round, and reactive to light.   Neck:      Musculoskeletal: Full passive range of motion without pain, normal range of motion and neck supple. No torticollis.      Thyroid: No thyromegaly.      Vascular: No carotid bruit.      Meningeal:  Brudzinski's sign and Kernig's sign absent.   Cardiovascular:      Rate and Rhythm: Normal rate and regular rhythm.      Pulses: Normal pulses.      Heart sounds: Normal heart sounds, S1 normal and S2 normal. No murmur.   Pulmonary:      Effort: Pulmonary effort is normal.   Musculoskeletal: Normal range of motion.         General: No swelling, tenderness, deformity or signs of injury.      Right lower leg: No edema.   Lymphadenopathy:  Cervical: No cervical adenopathy.   Skin:     General: Skin is warm.      Findings: No bruising, ecchymosis, erythema, petechiae or rash.      Nails: There is no clubbing.     Neurological:      General: No focal deficit present.      Mental Status: She is alert and oriented to person, place, and time. Mental status is at baseline.      Cranial Nerves: Cranial nerves are intact. No cranial nerve deficit, dysarthria or facial asymmetry.      Sensory: Sensation is intact. No sensory deficit.      Motor: Motor function is intact. No weakness, tremor, atrophy, abnormal muscle tone, seizure activity or pronator drift.      Coordination: Coordination is intact. Romberg sign negative. Coordination normal. Finger-Nose-Finger Test and Heel to [facility] Test normal. Rapid alternating movements normal.      Gait: Gait normal.      Deep Tendon Reflexes: Reflexes are normal and symmetric. Reflexes normal. Babinski sign absent on the right side. Babinski sign absent on the left side.      Reflex Scores:       Tricep reflexes are 2+ on the right side and 2+ on the left side.       Bicep reflexes are 2+ on the right side and 2+ on the left side.       Brachioradialis reflexes are 2+ on the right side and 2+ on the left side.       Patellar reflexes are 2+ on the right side and 2+ on the left side.       Achilles reflexes are 2+ on the right side and 2+ on the left side.  Psychiatric:         Attention and Perception: Attention and perception normal.         Mood and Affect: Mood and affect normal.  Mood is not depressed.         Speech: Speech normal.         Behavior: Behavior normal. Behavior is cooperative.         Thought Content: Thought content normal.         Cognition and Memory: Cognition and memory normal.         Judgment: Judgment normal. Judgment is not impulsive or inappropriate.           Impression:   Diagnosis Orders   1. Seizure ([place])  EEG awake and asleep   2. History of cerebral meningioma          Plan:   1.We are to schedule patient for an EEG of the brain  sleep deprived, 1 hour of recording To see we can find any interictal epileptiform activity.  2 if the EEG is normal when going to change the patient antibiotic medication.  Patient is taking topiramate for the pain and migraines and we will continue the same medication.  3.  Patient was advised not to drive until we have had definitive diagnosis.       Kate Sable, MD on 12/22/18

## 2018-12-22 NOTE — Patient Instructions (Addendum)
Before the EEG:    -3-4 days before the EEG cut the Topamax dose in half, then take your normal dose after the EEG. You can stop either the morning or evening dose.    -The evening before the EEG do not take the amitriptyline, the tizanidne or the herbal supplements for sleep.    -Try to stay awake the night before the EEG as much as possible.

## 2019-01-03 ENCOUNTER — Other Ambulatory Visit: Payer: Self-pay | Admitting: Nurse Practitioner

## 2019-01-03 DIAGNOSIS — F5101 Primary insomnia: Secondary | ICD-10-CM

## 2019-01-03 MED ORDER — ZOLPIDEM TARTRATE 10 MG PO TABS: 10.0000 mg | ORAL_TABLET | Freq: Every evening | ORAL | 3 refills | Status: DC | PRN

## 2019-01-03 NOTE — Progress Notes (Signed)
Refilled request for zolpidem 10mg  at bedtime as needed. [facility].

## 2019-01-25 ENCOUNTER — Inpatient Hospital Stay: Admit: 2019-01-25 | Attending: Neurology | Primary: Internal Medicine

## 2019-01-25 NOTE — Procedures (Signed)
Reason for EEG:    Dysarthria, dizziness and right facial droop-rule out focal seizure.    Technical comments:    The total duration of this EEG is 24 minutes and 55 seconds.    The patient was cooperative.    Hyperventilation was not performed due to the patient related factors.    Some movement, EKG, myogenic, and lead artifacts were recorded however, overall, this EEG is adequate for reading and interpretation.    EEG was reviewed using Referential, Longitudinal and Bipolar montages.    General Description:    This was a 20 channel, awake and drowsy EEG recording with international 10/20 electrode placement.    The background activity consisted of symmetrical, posterior dominant, low to low-medium amplitude, well organized, 11 to 12 Hz alpha.     No abnormal slowing and no abnormal fast wave activities were recorded during this EEG.    Eye opening and photic stimulation produced no abnormalities.    A brief duration of stages 1 and 2 of sleep were recorded with appropriate electrographic changes.    No epileptiform discharges were recorded during this EEG.    No electrographic seizures were recorded during this EEG.    Rare bifrontal temporal triphasic discharges and, one episode of left frontoparietal epileptiform discharge was recorded during this EEG.  These findings are of unclear significance.  Clinical correlation is advised.    EKG showed Sinus arrhythmia with a heart rate of about 66-72 beats per minute.    Clinical Impression:    This EEG is ABNORMAL.    This abnormality is due to the presence of the above noted rare episodes of bifrontal temporal triphasic wave discharges and one episode of left frontoparietal epileptiform discharge, recorded during this EEG.  The exact significance of this finding is unclear and, clinical correlation is advised.    No electrographic seizures are recorded during this EEG.    It should be noted that the absence of seizure activity in an EEG recording does not rule out  the diagnosis of a seizure disorder, clinical correlation is advised.    Recommendations:    Per discretion of the referring provider.    Thank  you.

## 2019-01-25 NOTE — Other (Unsigned)
Patient Acct Nbr: [id number]   Primary AUTH/CERT:   Primary Insurance Company Name: [employer]  Primary Insurance Plan name: [facility]  Primary Insurance Group Number: [id number]  Primary Insurance Plan Type: Health  Primary Insurance Policy Number: [id number]

## 2019-02-02 ENCOUNTER — Ambulatory Visit: Admit: 2019-02-02 | Discharge: 2019-02-02 | Payer: MEDICAID | Attending: Neurology | Primary: Internal Medicine

## 2019-02-02 DIAGNOSIS — G40909 Epilepsy, unspecified, not intractable, without status epilepticus: Secondary | ICD-10-CM

## 2019-02-02 MED ORDER — TOPIRAMATE 100 MG PO TABS
100 MG | ORAL_TABLET | ORAL | 3 refills | Status: DC
Start: 2019-02-02 — End: 2019-12-04

## 2019-02-03 NOTE — Progress Notes (Signed)
Department of Neurological Sciences  Visit  Note     02/03/19       CHIEF COMPLAINT:   Chief Complaint   Patient presents with   ??? Follow-up   ??? Neurologic Problem        Main diagnoses: Left frontal craniotomy.  2.  Frontal lobe seizures.    HISTORY OF PRESENT ILLNESS:     The patient is a 58 y.o.  female today presents to  the Neurology clinic with 's study of her left frontal meningioma, status post left frontal craniectomy.Patient was initially evaluated on August 14 20/20, When patient presented to the neurology clinic complaining of one episode of aphasia and confusion, associated with right arm weakness.  Patient have a left frontal craniectomy for excision of meningioma.  After the initial evaluation, made patient was then patient most likely Have left frontal seizure.  Patient was referred for a sleep deprived 1 hour recording the EEG.  Today patient presented to the neurology clinic for a follow-up visit.  Patient denied any new states that seizures.  The EEG report shows the presence of rare bifrontal triphasic waves.  In addition, there was a single episode of a frontoparietal epileptiform discharge.  Given patient medical history, clinical presentation and EEG finding is appears evident that the patient was likely have left frontal seizures.    Secundary diagnoses: Status post resection of left frontal meningioma.      Medications:   Current Outpatient Medications   Medication Sig Dispense Refill   ??? topiramate (TOPAMAX) 100 MG tablet Take 1 tablet in am and 11/2 tablets at pm 225 tablet 3   ??? traMADol (ULTRAM) 50 MG tablet Take 50 mg by mouth nightly.     ??? topiramate (TOPAMAX) 100 MG tablet Take 100 mg by mouth 2 times daily     ??? DIPHENOXYLATE-ATROPINE PO Take 2.5 mg by mouth 4 times daily For Diarrhea/Colitis     ??? irbesartan (AVAPRO) 300 MG tablet Take 300 mg by mouth daily      ??? aspirin 81 MG chewable tablet Take 81 mg by mouth daily Patient stopped per Dr. Christiana Fuchs 10 days prior to surgery      ??? hydrochlorothiazide (HYDRODIURIL) 25 MG tablet Take 25 mg by mouth daily     ??? budesonide (ENTOCORT EC) 3 MG extended release capsule Take 6 mg by mouth every morning     ??? cholestyramine (QUESTRAN) 4 g packet Take 1 packet by mouth 2 times daily      ??? Menthol, Topical Analgesic, (BIOFREEZE COLORLESS EX) Apply topically as needed      ??? tiZANidine (ZANAFLEX) 4 MG tablet Take 4 mg by mouth 2 times daily      ??? Multiple Vitamins-Iron (TAB-A-VITE/IRON) TABS Take 1 tablet by mouth daily      ??? amitriptyline (ELAVIL) 10 MG tablet Take 30 mg by mouth nightly      ??? SYNTHROID 112 MCG tablet Take 112 mcg by mouth daily      ??? cetirizine (ZYRTEC) 10 MG tablet Take 10 mg by mouth daily      ??? vitamin D (CHOLECALCIFEROL) 1000 UNIT TABS tablet Take 2,000 Units by mouth daily        No current facility-administered medications for this visit.         Allergies:  Iodine; Adhesive tape; Ciprofloxacin; Oxaprozin; Gabapentin; and Influenza vaccines    Social History:  Social History     Socioeconomic History   ??? Marital status: Divorced  Spouse name: Not on file   ??? Number of children: Not on file   ??? Years of education: Not on file   ??? Highest education level: Not on file   Occupational History   ??? Not on file   Social Needs   ??? Financial resource strain: Not on file   ??? Food insecurity     Worry: Not on file     Inability: Not on file   ??? Transportation needs     Medical: Not on file     Non-medical: Not on file   Tobacco Use   ??? Smoking status: Former Smoker     Packs/day: 0.25     Years: 1.00     Pack years: 0.25     Types: Cigarettes     Last attempt to quit: 01/25/1978     Years since quitting: 41.0   ??? Smokeless tobacco: Never Used   Substance and Sexual Activity   ??? Alcohol use: Not Currently     Frequency: Monthly or less     Comment: Socially   ??? Drug use: No   ??? Sexual activity: Not on file   Lifestyle   ??? Physical activity     Days per week: Not on file     Minutes per session: Not on file   ??? Stress: Not on file    Relationships   ??? Social Product manager on phone: Not on file     Gets together: Not on file     Attends religious service: Not on file     Active member of club or organization: Not on file     Attends meetings of clubs or organizations: Not on file     Relationship status: Not on file   ??? Intimate partner violence     Fear of current or ex partner: Not on file     Emotionally abused: Not on file     Physically abused: Not on file     Forced sexual activity: Not on file   Other Topics Concern   ??? Not on file   Social History Narrative   ??? Not on file        Family History:   Family History   Problem Relation Age of Onset   ??? Other Brother         allergies   ??? Brain Cancer Brother    ??? High Blood Pressure Mother    ??? Stroke Mother    ??? Heart Disease Father    ??? High Blood Pressure Sister    ??? Other Sister         allergies   ??? Mult Sclerosis Sister           REVIEW OF SYSTEMS:  Review of Systems   Constitutional: Negative.    HENT: Negative.    Eyes: Positive for photophobia.   Respiratory: Negative.    Cardiovascular: Negative.    Gastrointestinal: Negative.    Endocrine: Negative.    Genitourinary: Negative.    Musculoskeletal: Negative.    Skin: Negative.    Allergic/Immunologic: Negative.    Neurological: Positive for seizures.   Hematological: Negative.    Psychiatric/Behavioral: Positive for confusion.        PHYSICAL EXAM:    Vitals:  BP 100/70    Pulse 92    Temp 97.5 ??F (36.4 ??C) (Infrared)    Ht 5\' 5"  (1.651 m)    Wt 152 lb (68.9  kg)    SpO2 98%    BMI 25.29 kg/m??     Physical Exam  Vitals signs reviewed.   Constitutional:       General: She is not in acute distress.     Appearance: She is not ill-appearing.   HENT:      Head: Normocephalic.      Jaw: No tenderness, swelling or pain on movement.      Mouth/Throat:      Mouth: Mucous membranes are moist.   Eyes:      General: Lids are normal. Vision grossly intact. Gaze aligned appropriately. No visual field deficit or scleral icterus.      Extraocular Movements: Extraocular movements intact.      Right eye: Normal extraocular motion and no nystagmus.      Left eye: Normal extraocular motion and no nystagmus.      Conjunctiva/sclera: Conjunctivae normal.      Right eye: Right conjunctiva is not injected.      Pupils: Pupils are equal, round, and reactive to light.   Neck:      Musculoskeletal: Full passive range of motion without pain, normal range of motion and neck supple. No torticollis.      Thyroid: No thyromegaly.      Vascular: No carotid bruit.      Meningeal: Brudzinski's sign and Kernig's sign absent.   Cardiovascular:      Rate and Rhythm: Normal rate and regular rhythm.      Pulses: Normal pulses.      Heart sounds: Normal heart sounds, S1 normal and S2 normal. No murmur.   Pulmonary:      Effort: Pulmonary effort is normal.   Musculoskeletal: Normal range of motion.         General: No swelling, tenderness, deformity or signs of injury.      Right lower leg: No edema.   Lymphadenopathy:      Cervical: No cervical adenopathy.   Skin:     General: Skin is warm.      Findings: No bruising, ecchymosis, erythema, petechiae or rash.      Nails: There is no clubbing.     Neurological:      General: No focal deficit present.      Mental Status: She is alert and oriented to person, place, and time. Mental status is at baseline.      Cranial Nerves: Cranial nerves are intact. No cranial nerve deficit, dysarthria or facial asymmetry.      Sensory: Sensation is intact. No sensory deficit.      Motor: Motor function is intact. No weakness, tremor, atrophy, abnormal muscle tone, seizure activity or pronator drift.      Coordination: Coordination is intact. Romberg sign negative. Coordination normal. Finger-Nose-Finger Test and Heel to [facility] Test normal. Rapid alternating movements normal.      Gait: Gait normal.      Deep Tendon Reflexes: Reflexes are normal and symmetric. Reflexes normal. Babinski sign absent on the right side. Babinski sign absent on  the left side.      Reflex Scores:       Tricep reflexes are 2+ on the right side and 2+ on the left side.       Bicep reflexes are 2+ on the right side and 2+ on the left side.       Brachioradialis reflexes are 2+ on the right side and 2+ on the left side.       Patellar reflexes are 2+  on the right side and 2+ on the left side.       Achilles reflexes are 2+ on the right side and 2+ on the left side.  Psychiatric:         Attention and Perception: Attention and perception normal.         Mood and Affect: Mood and affect normal. Mood is not depressed.         Speech: Speech normal.         Behavior: Behavior normal. Behavior is cooperative.         Thought Content: Thought content normal.         Cognition and Memory: Cognition and memory normal.         Judgment: Judgment normal. Judgment is not impulsive or inappropriate.           Impression:   Diagnosis Orders   1. Seizure disorder ([place])          Plan:   1.  Patient was advised not to drive for the next 6 months according to [place] law.  2.  Since patient is already taking topiramate 100 mg by mouth twice a day.  We are going to increase the doses to 100 mg in the morning and 150 at bedtime.  3.  In addition, I advised patient to decrease her caffeine intake to only 1 or 2 cups a day.  4.  Finally patient was advised not to take any more tramadol.  Tramadol can lower seizure threshold.  5.  Patient was returned to the neurology clinic in 6 months for further evaluation.  Patient was instructed to call if she has any new seizures.       Kate Sable, MD

## 2019-02-20 ENCOUNTER — Other Ambulatory Visit: Payer: Self-pay

## 2019-02-20 MED ORDER — ATORVASTATIN CALCIUM 10 MG PO TABS: ORAL_TABLET | ORAL | 5 refills | Status: DC

## 2019-02-20 MED ORDER — BISOPROLOL FUMARATE 5 MG PO TABS: 5.0000 mg | ORAL_TABLET | Freq: Every day | ORAL | 5 refills | Status: DC

## 2019-02-20 MED ORDER — LISINOPRIL 2.5 MG PO TABS: ORAL_TABLET | ORAL | 5 refills | Status: DC

## 2019-02-20 MED ORDER — LINZESS 145 MCG PO CAPS: 145.0000 ug | ORAL_CAPSULE | Freq: Every day | ORAL | 5 refills | Status: DC

## 2019-02-22 ENCOUNTER — Ambulatory Visit: Payer: Self-pay | Admitting: Nurse Practitioner

## 2019-03-01 ENCOUNTER — Ambulatory Visit (INDEPENDENT_AMBULATORY_CARE_PROVIDER_SITE_OTHER): Payer: BC Managed Care – PPO | Admitting: Nurse Practitioner

## 2019-03-01 ENCOUNTER — Other Ambulatory Visit: Payer: Self-pay

## 2019-03-01 ENCOUNTER — Encounter: Payer: Self-pay | Admitting: Nurse Practitioner

## 2019-03-01 VITALS — BP 118/64 | HR 82 | Temp 97.8°F | Resp 16 | Ht 64.0 in | Wt 159.8 lb

## 2019-03-01 DIAGNOSIS — Z794 Long term (current) use of insulin: Secondary | ICD-10-CM

## 2019-03-01 DIAGNOSIS — I10 Essential (primary) hypertension: Secondary | ICD-10-CM

## 2019-03-01 DIAGNOSIS — E1165 Type 2 diabetes mellitus with hyperglycemia: Secondary | ICD-10-CM | POA: Diagnosis not present

## 2019-03-01 DIAGNOSIS — E782 Mixed hyperlipidemia: Secondary | ICD-10-CM | POA: Diagnosis not present

## 2019-03-01 LAB — POCT GLYCOSYLATED HEMOGLOBIN (HGB A1C): Hemoglobin A1C: 8.3 % — AB (ref 4.0–5.6)

## 2019-03-01 NOTE — Progress Notes (Signed)
[facility] [place], [place] [address]  Internal MEDICINE  Office Visit Note  Patient Name: Chelsea Rivera  [id number]  [id number]  Date of Service: 03/01/2019  Chief Complaint  Patient presents with  . Diabetes    takes between 25-30 units of insulin now  . Hypertension  . Hyperlipidemia  . Gastroesophageal Reflux  . Quality Metric Gaps    mammogram, pneumonia, diabetic foot exam, and pap    The patient is here for routine follow up. She is diabetic. Currently taking levemir between 28 and 30 units every day. Uses trulicity 1.5mg  weekly and takes metformin. Blood sugars have improved some since last visit. HgbA1c is 8.3, down from 8.9.she feels good. Blood pressure is well controlled. She has no concerns or complaints today.      Current Medication: Outpatient Encounter Medications as of 03/01/2019  Medication Sig Note  . allopurinol (ZYLOPRIM) 100 MG tablet Take by mouth. 05/14/2016: Received from: [place]: Take 100 mg by mouth.  Marland Kitchen aspirin EC 81 MG tablet Take 81 mg by mouth daily.   Marland Kitchen atorvastatin (LIPITOR) 10 MG tablet Take 1/2 tab po  Daily for high cholesterol   . benzonatate (TESSALON) 200 MG capsule Take 1 capsule (200 mg total) by mouth 2 (two) times daily as needed for cough.   . bisoprolol (ZEBETA) 5 MG tablet Take 1 tablet (5 mg total) by mouth at bedtime.   . conjugated estrogens (PREMARIN) vaginal cream 1 applicator vaginally twice weekly   . cyclobenzaprine (FLEXERIL) 10 MG tablet Take 1 tablet (10 mg total) by mouth at bedtime.   . Dulaglutide (TRULICITY) 1.5 0000000 SOPN INJECT 1.5MG  ONCE A WEEK   . furosemide (LASIX) 20 MG tablet Take 1 tablet (20 mg total) by mouth daily as needed.   . Insulin Detemir (LEVEMIR FLEXTOUCH) 100 UNIT/ML Pen 30 units at bedtime   . Insulin Pen Needle (BD PEN NEEDLE NANO U/F) 32G X 4 MM MISC Use as directed   . LINZESS 145 MCG CAPS capsule Take 1 capsule (145 mcg total)  by mouth daily.   Marland Kitchen lisinopril (ZESTRIL) 2.5 MG tablet TAKE ONE TAB EVERY DAY   . metFORMIN (GLUCOPHAGE) 1000 MG tablet Take 1 tablet (1,000 mg total) by mouth 2 (two) times daily.   . metFORMIN (GLUCOPHAGE-XR) 750 MG 24 hr tablet Take by mouth. 05/14/2016: Received from: [place]: Take 750 mg by mouth 2 (two) times daily.  Marland Kitchen omeprazole (PRILOSEC) 40 MG capsule Take 1 capsule (40 mg total) by mouth daily.   . ondansetron (ZOFRAN) 8 MG tablet Take 1 tab po twice a daily as needed for nausea   . ONETOUCH ULTRA test strip TEST BLOOD SUGAR 3 TIMES DAILY AS DIRECTED   . phentermine (ADIPEX-P) 37.5 MG tablet Take 1 tablet (37.5 mg total) by mouth daily before breakfast.   . rifaximin (XIFAXAN) 550 MG TABS tablet Take 1 tablet (550 mg total) by mouth 2 (two) times daily.   Marland Kitchen zolpidem (AMBIEN) 10 MG tablet Take 1 tablet (10 mg total) by mouth at bedtime as needed.   . [DISCONTINUED] sulfamethoxazole-trimethoprim (BACTRIM DS) 800-160 MG tablet Take 1 tablet by mouth 2 (two) times daily. (Patient not taking: Reported on 03/01/2019)    No facility-administered encounter medications on file as of 03/01/2019.     Surgical History: Past Surgical History:  Procedure Laterality Date  . BREAST BIOPSY Right 2012   cleaned out fistula, not a  biopsy  . CHOLECYSTECTOMY    . COLONOSCOPY WITH PROPOFOL N/A 07/07/2018   Procedure: COLONOSCOPY WITH PROPOFOL;  Surgeon: Lollie Sails, MD;  Location: [facility] ENDOSCOPY;  Service: Endoscopy;  Laterality: N/A;  . SHOULDER ARTHROSCOPY      Medical History: Past Medical History:  Diagnosis Date  . Diabetes mellitus without complication ([place])   . GERD (gastroesophageal reflux disease)   . Hyperlipidemia   . Hypertension   . Lateral epicondylitis of right elbow     Family History: Family History  Problem Relation Age of Onset  . Lung cancer Mother   . Colon cancer Father   . Breast cancer Neg Hx     Social History    Socioeconomic History  . Marital status: Married    Spouse name: Not on file  . Number of children: Not on file  . Years of education: Not on file  . Highest education level: Not on file  Occupational History  . Not on file  Social Needs  . Financial resource strain: Not on file  . Food insecurity    Worry: Not on file    Inability: Not on file  . Transportation needs    Medical: Not on file    Non-medical: Not on file  Tobacco Use  . Smoking status: Never Smoker  . Smokeless tobacco: Never Used  Substance and Sexual Activity  . Alcohol use: Yes    Frequency: Never    Comment: socially  . Drug use: No  . Sexual activity: Not on file  Lifestyle  . Physical activity    Days per week: Not on file    Minutes per session: Not on file  . Stress: Not on file  Relationships  . Social Herbalist on phone: Not on file    Gets together: Not on file    Attends religious service: Not on file    Active member of club or organization: Not on file    Attends meetings of clubs or organizations: Not on file    Relationship status: Not on file  . Intimate partner violence    Fear of current or ex partner: Not on file    Emotionally abused: Not on file    Physically abused: Not on file    Forced sexual activity: Not on file  Other Topics Concern  . Not on file  Social History Narrative  . Not on file      Review of Systems  Constitutional: Negative for activity change, appetite change, chills, fatigue, fever and unexpected weight change.  HENT: Negative for congestion, ear pain, facial swelling, hearing loss, postnasal drip, rhinorrhea, sinus pressure, sinus pain, sore throat, trouble swallowing and voice change.   Respiratory: Negative for apnea, cough, chest tightness, shortness of breath, wheezing and stridor.   Cardiovascular: Negative for chest pain and palpitations.  Gastrointestinal: Positive for constipation. Negative for abdominal distention, abdominal pain,  diarrhea, nausea, rectal pain and vomiting.  Endocrine: Negative for cold intolerance, heat intolerance, polydipsia and polyuria.       Improved blood sugars since last visit .  Allergic/Immunologic: Positive for environmental allergies.       Takes Zyrtec as needed  Neurological: Negative for dizziness and headaches.  Hematological: Negative for adenopathy. Does not bruise/bleed easily.  Psychiatric/Behavioral: Negative for agitation and dysphoric mood. The patient is nervous/anxious.     Today's Vitals   03/01/19 0853  BP: 118/64  Pulse: 82  Resp: 16  Temp: 97.8 F (36.6  C)  SpO2: 98%  Weight: 159 lb 12.8 oz (72.5 kg)  Height: 5\' 4"  (1.626 m)   Body mass index is 27.43 kg/m.  Physical Exam Vitals signs and nursing note reviewed.  Constitutional:      General: She is not in acute distress.    Appearance: Normal appearance. She is well-developed. She is not diaphoretic.  HENT:     Head: Normocephalic and atraumatic.     Mouth/Throat:     Pharynx: No oropharyngeal exudate.  Eyes:     Extraocular Movements: Extraocular movements intact.     Pupils: Pupils are equal, round, and reactive to light.  Neck:     Musculoskeletal: Normal range of motion and neck supple.     Thyroid: No thyromegaly.     Vascular: No JVD.     Trachea: No tracheal deviation.  Cardiovascular:     Rate and Rhythm: Normal rate and regular rhythm.     Heart sounds: Normal heart sounds. No murmur. No friction rub. No gallop.   Pulmonary:     Effort: Pulmonary effort is normal. No respiratory distress.     Breath sounds: Normal breath sounds. No wheezing or rales.  Chest:     Chest wall: No tenderness.  Abdominal:     General: Bowel sounds are normal.     Palpations: Abdomen is soft.     Tenderness: There is no abdominal tenderness.  Musculoskeletal: Normal range of motion.  Lymphadenopathy:     Cervical: No cervical adenopathy.  Skin:    General: Skin is warm and dry.  Neurological:      Mental Status: She is alert and oriented to person, place, and time.     Cranial Nerves: No cranial nerve deficit.  Psychiatric:        Behavior: Behavior normal.        Thought Content: Thought content normal.        Judgment: Judgment normal.   Assessment/Plan:  1. Type 2 diabetes mellitus with hyperglycemia, with long-term current use of insulin (HCC) - POCT HgB A1C 8.3 today. Continue diabetic medication as prescribed   2. Benign hypertension Stable. Continue bp medication as prescribed   3. Mixed hyperlipidemia Continue statin therapy as prescribed   General Counseling: kristl frawley understanding of the findings of todays visit and agrees with plan of treatment. I have discussed any further diagnostic evaluation that may be needed or ordered today. We also reviewed her medications today. she has been encouraged to call the office with any questions or concerns that should arise related to todays visit.   Diabetes Counseling:  1. Addition of ACE inh/ ARB'S for nephroprotection. Microalbumin is updated  2. Diabetic foot care, prevention of complications. Podiatry consult 3. Exercise and lose weight.  4. Diabetic eye examination, Diabetic eye exam is updated  5. Monitor blood sugar closlely. nutrition counseling.  6. Sign and symptoms of hypoglycemia including shaking sweating,confusion and headaches.  This patient was seen by Leretha Pol FNP Collaboration with Dr Lavera Guise as a part of collaborative care agreement  Orders Placed This Encounter  Procedures  . POCT HgB A1C    Time spent: 25 Minutes      Dr Lavera Guise Internal medicine

## 2019-03-05 ENCOUNTER — Other Ambulatory Visit: Payer: Self-pay | Admitting: Internal Medicine

## 2019-03-05 DIAGNOSIS — Z1231 Encounter for screening mammogram for malignant neoplasm of breast: Secondary | ICD-10-CM

## 2019-03-09 ENCOUNTER — Other Ambulatory Visit: Payer: Self-pay | Admitting: Nurse Practitioner

## 2019-03-09 MED ORDER — TRULICITY 1.5 MG/0.5ML ~~LOC~~ SOAJ: SUBCUTANEOUS | 3 refills | Status: DC

## 2019-03-12 ENCOUNTER — Other Ambulatory Visit: Payer: Managed Care, Other (non HMO)

## 2019-03-12 ENCOUNTER — Other Ambulatory Visit: Payer: Self-pay

## 2019-03-12 DIAGNOSIS — I10 Essential (primary) hypertension: Secondary | ICD-10-CM

## 2019-03-12 DIAGNOSIS — Z0000 Encounter for general adult medical examination without abnormal findings: Secondary | ICD-10-CM | POA: Diagnosis not present

## 2019-03-12 DIAGNOSIS — Z23 Encounter for immunization: Secondary | ICD-10-CM | POA: Diagnosis not present

## 2019-03-12 DIAGNOSIS — E559 Vitamin D deficiency, unspecified: Secondary | ICD-10-CM

## 2019-03-12 DIAGNOSIS — E119 Type 2 diabetes mellitus without complications: Secondary | ICD-10-CM

## 2019-03-12 NOTE — Addendum Note (Signed)
Addended by: Jay Schlichter D on: 03/12/2019 09:47 AM   Modules accepted: Orders

## 2019-03-13 LAB — CBC WITH DIFFERENTIAL/PLATELET
Basophils Absolute: 0.1 10*3/uL (ref 0.0–0.2)
Basos: 1 %
EOS (ABSOLUTE): 0.4 10*3/uL (ref 0.0–0.4)
Eos: 7 %
Hematocrit: 36.6 % (ref 34.0–46.6)
Hemoglobin: 11.8 g/dL (ref 11.1–15.9)
Immature Grans (Abs): 0 10*3/uL (ref 0.0–0.1)
Immature Granulocytes: 0 %
Lymphocytes Absolute: 2.5 10*3/uL (ref 0.7–3.1)
Lymphs: 41 %
MCH: 27.9 pg (ref 26.6–33.0)
MCHC: 32.2 g/dL (ref 31.5–35.7)
MCV: 87 fL (ref 79–97)
Monocytes Absolute: 0.5 10*3/uL (ref 0.1–0.9)
Monocytes: 8 %
Neutrophils Absolute: 2.7 10*3/uL (ref 1.4–7.0)
Neutrophils: 43 %
Platelets: 269 10*3/uL (ref 150–450)
RBC: 4.23 x10E6/uL (ref 3.77–5.28)
RDW: 12.7 % (ref 11.7–15.4)
WBC: 6.1 10*3/uL (ref 3.4–10.8)

## 2019-03-13 LAB — TSH+FREE T4
Free T4: 1.42 ng/dL (ref 0.82–1.77)
TSH: 3.36 u[IU]/mL (ref 0.450–4.500)

## 2019-03-13 LAB — COMPREHENSIVE METABOLIC PANEL
ALT: 20 IU/L (ref 0–32)
AST: 15 IU/L (ref 0–40)
Albumin/Globulin Ratio: 1.2 (ref 1.2–2.2)
Albumin: 3.6 g/dL — ABNORMAL LOW (ref 3.8–4.9)
Alkaline Phosphatase: 69 IU/L (ref 39–117)
BUN/Creatinine Ratio: 27 (ref 12–28)
BUN: 27 mg/dL (ref 8–27)
Bilirubin Total: <0.2 mg/dL (ref 0.0–1.2)
CO2: 24 mmol/L (ref 20–29)
Calcium: 9.9 mg/dL (ref 8.7–10.3)
Chloride: 103 mmol/L (ref 96–106)
Creatinine, Ser: 0.99 mg/dL (ref 0.57–1.00)
GFR calc Af Amer: 72 mL/min/{1.73_m2} (ref >59)
GFR calc non Af Amer: 62 mL/min/{1.73_m2} (ref >59)
Globulin, Total: 3 g/dL (ref 1.5–4.5)
Glucose: 111 mg/dL — ABNORMAL HIGH (ref 65–99)
Potassium: 5.9 mmol/L — ABNORMAL HIGH (ref 3.5–5.2)
Sodium: 141 mmol/L (ref 134–144)
Total Protein: 6.6 g/dL (ref 6.0–8.5)

## 2019-03-13 LAB — HCV RNA BY PCR, QN RFX GENO: HCV Quant Baseline: NOT DETECTED IU/mL

## 2019-03-13 LAB — LIPID PANEL
Chol/HDL Ratio: 3 ratio (ref 0.0–4.4)
Cholesterol, Total: 151 mg/dL (ref 100–199)
HDL: 51 mg/dL (ref >39)
LDL Chol Calc (NIH): 87 mg/dL (ref 0–99)
Triglycerides: 63 mg/dL (ref 0–149)
VLDL Cholesterol Cal: 13 mg/dL (ref 5–40)

## 2019-03-13 LAB — VITAMIN D 25 HYDROXY (VIT D DEFICIENCY, FRACTURES): Vit D, 25-Hydroxy: 50.9 ng/mL (ref 30.0–100.0)

## 2019-03-15 ENCOUNTER — Other Ambulatory Visit: Payer: Self-pay | Admitting: Internal Medicine

## 2019-03-15 DIAGNOSIS — E875 Hyperkalemia: Secondary | ICD-10-CM

## 2019-03-15 MED ORDER — FUROSEMIDE 20 MG PO TABS: ORAL_TABLET | ORAL | 3 refills | Status: DC

## 2019-03-15 MED ORDER — KAYEXALATE PO POWD: 30.0000 g | Freq: Once | ORAL | 0 refills | Status: AC

## 2019-03-15 NOTE — Progress Notes (Signed)
Pt was notified of hyperkalemia  1. Kayexalate 30gr x 1 now. 2. Lasix 20 mgM/W/F Pt has been seen by endocrinology In the past. She did not have a obvious source at the time, all ace and ARB;s has been stopped

## 2019-03-16 ENCOUNTER — Other Ambulatory Visit: Payer: Self-pay

## 2019-03-16 ENCOUNTER — Emergency Department
Admission: EM | Admit: 2019-03-16 | Discharge: 2019-03-16 | Disposition: A | Discharge status: Home / Self Care | Payer: BC Managed Care – PPO | Attending: Emergency Medicine | Admitting: Emergency Medicine

## 2019-03-16 DIAGNOSIS — E875 Hyperkalemia: Secondary | ICD-10-CM | POA: Insufficient documentation

## 2019-03-16 DIAGNOSIS — R55 Syncope and collapse: Secondary | ICD-10-CM | POA: Diagnosis present

## 2019-03-16 DIAGNOSIS — E119 Type 2 diabetes mellitus without complications: Secondary | ICD-10-CM | POA: Insufficient documentation

## 2019-03-16 DIAGNOSIS — I10 Essential (primary) hypertension: Secondary | ICD-10-CM | POA: Diagnosis not present

## 2019-03-16 LAB — BASIC METABOLIC PANEL
Anion gap: 10 (ref 5–15)
BUN: 33 mg/dL — ABNORMAL HIGH (ref 6–20)
CO2: 27 mmol/L (ref 22–32)
Calcium: 9.7 mg/dL (ref 8.9–10.3)
Chloride: 98 mmol/L (ref 98–111)
Creatinine, Ser: 1 mg/dL (ref 0.44–1.00)
GFR calc Af Amer: >60 mL/min (ref >60)
GFR calc non Af Amer: >60 mL/min (ref >60)
Glucose, Bld: 264 mg/dL — ABNORMAL HIGH (ref 70–99)
Potassium: 5.6 mmol/L — ABNORMAL HIGH (ref 3.5–5.1)
Sodium: 135 mmol/L (ref 135–145)

## 2019-03-16 LAB — CBC
HCT: 35.1 % — ABNORMAL LOW (ref 36.0–46.0)
Hemoglobin: 11.5 g/dL — ABNORMAL LOW (ref 12.0–15.0)
MCH: 27.3 pg (ref 26.0–34.0)
MCHC: 32.8 g/dL (ref 30.0–36.0)
MCV: 83.4 fL (ref 80.0–100.0)
Platelets: 266 10*3/uL (ref 150–400)
RBC: 4.21 MIL/uL (ref 3.87–5.11)
RDW: 13 % (ref 11.5–15.5)
WBC: 8.6 10*3/uL (ref 4.0–10.5)
nRBC: 0 % (ref 0.0–0.2)

## 2019-03-16 LAB — URINALYSIS, COMPLETE (UACMP) WITH MICROSCOPIC
Bilirubin Urine: NEGATIVE
Glucose, UA: >=500 mg/dL — AB
Hgb urine dipstick: NEGATIVE
Ketones, ur: NEGATIVE mg/dL
Leukocytes,Ua: NEGATIVE
Nitrite: NEGATIVE
Protein, ur: NEGATIVE mg/dL
Specific Gravity, Urine: 1.012 (ref 1.005–1.030)
WBC, UA: NONE SEEN WBC/hpf (ref 0–5)
pH: 5 (ref 5.0–8.0)

## 2019-03-16 LAB — GLUCOSE, CAPILLARY: Glucose-Capillary: 241 mg/dL — ABNORMAL HIGH (ref 70–99)

## 2019-03-16 MED ORDER — SODIUM CHLORIDE 0.9 % IV SOLN: Freq: Once | INTRAVENOUS | Status: DC

## 2019-03-16 NOTE — ED Triage Notes (Signed)
Pt arrives ambulatory to registration desk. From home with spouse. Spouse states she passed out this am and almost called 911 for her, states her eyes were open but she was unresponsive for 5 min. He finally was able to get her to come to and had her drink 2 glasses of OJ, states she was very diaphoretic. Reports hx of diabetes

## 2019-03-16 NOTE — ED Provider Notes (Signed)
[facility] Emergency Department Provider Note       Time seen: ----------------------------------------- 1:19 PM on 03/16/2019 -----------------------------------------   I have reviewed the triage vital signs and the nursing notes.  HISTORY   Chief Complaint Loss of Consciousness   HPI Chelsea Rivera is a 58 y.o. female with a history of diabetes, GERD, hyperlipidemia, hypertension who presents to the ED for low blood sugar followed by syncope.  Patient states she had a potassium of 5.9 yesterday at the doctor's office.  She has had no history of seizures.  She denies any current complaints.  Past Medical History:  Diagnosis Date  . Diabetes mellitus without complication ([place])   . GERD (gastroesophageal reflux disease)   . Hyperlipidemia   . Hypertension     Patient Active Problem List   Diagnosis Date Noted  . Acute non-recurrent pansinusitis 10/20/2018  . Nausea 08/26/2018  . Abnormal weight gain 04/03/2018  . Primary insomnia 04/03/2018  . Dysuria 04/03/2018  . Screening for colon cancer 12/14/2017  . Uncontrolled type 2 diabetes mellitus with hyperglycemia ([place]) 06/19/2017  . Small bowel disease 06/19/2017  . Benign hypertension 06/19/2017  . Mixed hyperlipidemia 06/19/2017  . Lateral epicondylitis of right elbow 08/29/2014  . Right elbow pain 08/29/2014  . Hyperkalemia 08/09/2013  . Type 2 diabetes mellitus with hyperglycemia, with long-term current use of insulin ([place]) 08/09/2013    Past Surgical History:  Procedure Laterality Date  . BREAST BIOPSY Right 2012   cleaned out fistula, not a biopsy  . CHOLECYSTECTOMY    . COLONOSCOPY WITH PROPOFOL N/A 07/07/2018   Procedure: COLONOSCOPY WITH PROPOFOL;  Surgeon: Lollie Sails, MD;  Location: [facility] ENDOSCOPY;  Service: Endoscopy;  Laterality: N/A;  . SHOULDER ARTHROSCOPY      Allergies Patient has no known allergies.  Social History Social History   Tobacco Use  . Smoking  status: Never Smoker  . Smokeless tobacco: Never Used  Substance Use Topics  . Alcohol use: Yes    Frequency: Never    Comment: socially  . Drug use: No    Review of Systems Constitutional: Negative for fever. Cardiovascular: Negative for chest pain. Respiratory: Negative for shortness of breath. Gastrointestinal: Negative for abdominal pain, vomiting and diarrhea. Musculoskeletal: Negative for back pain. Skin: Negative for rash. Neurological: Negative for headaches, focal weakness or numbness.  All systems negative/normal/unremarkable except as stated in the HPI  ____________________________________________   PHYSICAL EXAM:  VITAL SIGNS: ED Triage Vitals  Enc Vitals Group     BP 03/16/19 1048 118/63     Pulse Rate 03/16/19 1048 79     Resp 03/16/19 1048 16     Temp 03/16/19 1048 97.8 F (36.6 C)     Temp Source 03/16/19 1048 Oral     SpO2 03/16/19 1048 100 %     Weight 03/16/19 1049 158 lb 11.7 oz (72 kg)     Height 03/16/19 1049 5\' 4"  (1.626 m)     Head Circumference --      Peak Flow --      Pain Score 03/16/19 1049 0     Pain Loc --      Pain Edu? --      Excl. in [place]? --     Constitutional: Alert and oriented. Well appearing and in no distress. Eyes: Conjunctivae are normal. Normal extraocular movements. ENT      Head: Normocephalic and atraumatic.      Nose: No congestion/rhinnorhea.  Mouth/Throat: Mucous membranes are moist.      Neck: No stridor. Cardiovascular: Normal rate, regular rhythm. No murmurs, rubs, or gallops. Respiratory: Normal respiratory effort without tachypnea nor retractions. Breath sounds are clear and equal bilaterally. No wheezes/rales/rhonchi. Gastrointestinal: Soft and nontender. Normal bowel sounds Musculoskeletal: Nontender with normal range of motion in extremities. No lower extremity tenderness nor edema. Neurologic:  Normal speech and language. No gross focal neurologic deficits are appreciated.  Skin:  Skin is warm, dry  and intact. No rash noted. Psychiatric: Mood and affect are normal. Speech and behavior are normal.  ____________________________________________  EKG: Interpreted by me.  Sinus rhythm with a rate of 80 bpm, normal PR interval, normal QRS, normal QT  ____________________________________________  ED COURSE:  As part of my medical decision making, I reviewed the following data within the [place] History obtained from family if available, nursing notes, old chart and ekg, as well as notes from prior ED visits. Patient presented for syncope, we will assess with labs and imaging as indicated at this time.   Procedures  Chelsea Rivera was evaluated in Emergency Department on 03/16/2019 for the symptoms described in the history of present illness. She was evaluated in the context of the global COVID-19 pandemic, which necessitated consideration that the patient might be at risk for infection with the SARS-CoV-2 virus that causes COVID-19. Institutional protocols and algorithms that pertain to the evaluation of patients at risk for COVID-19 are in a state of rapid change based on information released by regulatory bodies including the CDC and federal and state organizations. These policies and algorithms were followed during the patient's care in the ED.  ____________________________________________   LABS (pertinent positives/negatives)  Labs Reviewed  BASIC METABOLIC PANEL - Abnormal; Notable for the following components:      Result Value   Potassium 5.6 (*)    Glucose, Bld 264 (*)    BUN 33 (*)    All other components within normal limits  CBC - Abnormal; Notable for the following components:   Hemoglobin 11.5 (*)    HCT 35.1 (*)    All other components within normal limits  GLUCOSE, CAPILLARY - Abnormal; Notable for the following components:   Glucose-Capillary 241 (*)    All other components within normal limits  URINALYSIS, COMPLETE (UACMP) WITH MICROSCOPIC  CBG  MONITORING, ED   ____________________________________________   DIFFERENTIAL DIAGNOSIS   Hypoglycemia, dehydration, electrolyte abnormality, arrhythmia, MI  FINAL ASSESSMENT AND PLAN  Syncope   Plan: The patient had presented for syncope that was likely secondary to both dehydration and hypoglycemia. Patient's labs do reveal hyperkalemia with a potassium of 5.6.  She has chronic hyperkalemia and this is not significantly changed from her prior.  Her BUN is elevated and concerning for some dehydration.  We will have her hold Lasix.  Family thinks she was syncopal from hypoglycemia and this was compounded likely from some dehydration.  I advised her to hold Lasix, drink extra fluids and follow-up with her doctor.   Laurence Aly, MD    Note: This note was generated in part or whole with voice recognition software. Voice recognition is usually quite accurate but there are transcription errors that can and very often do occur. I apologize for any typographical errors that were not detected and corrected.     Earleen Newport, MD 03/16/19 1346

## 2019-03-16 NOTE — Progress Notes (Signed)
Patient prescribed single dose kaexylate. Restart diuretic. Recheck labs.

## 2019-03-16 NOTE — ED Triage Notes (Signed)
Pt reports she awoke this AM and felt like her blood sugar was low, followed by syncope X 5 minutes approx per husband. Pt states she had potassium of 5.9 yesterday at doctors office. Pt has no hx of seizures. Pt reports that she feels mildly drowsy now but that is normal when blood sugar is low.  Pt alert and oriented X4, cooperative, RR even and unlabored, color WNL. Pt in NAD.

## 2019-03-16 NOTE — Discharge Instructions (Addendum)
Hold lasix, drink extra fluids until follow up.

## 2019-03-19 ENCOUNTER — Ambulatory Visit (INDEPENDENT_AMBULATORY_CARE_PROVIDER_SITE_OTHER): Payer: BC Managed Care – PPO | Admitting: Internal Medicine

## 2019-03-19 ENCOUNTER — Other Ambulatory Visit: Payer: Self-pay

## 2019-03-19 ENCOUNTER — Encounter: Payer: Self-pay | Admitting: Internal Medicine

## 2019-03-19 DIAGNOSIS — E875 Hyperkalemia: Secondary | ICD-10-CM

## 2019-03-19 DIAGNOSIS — R55 Syncope and collapse: Secondary | ICD-10-CM

## 2019-03-19 DIAGNOSIS — E1165 Type 2 diabetes mellitus with hyperglycemia: Secondary | ICD-10-CM

## 2019-03-19 DIAGNOSIS — D509 Iron deficiency anemia, unspecified: Secondary | ICD-10-CM

## 2019-03-19 NOTE — Progress Notes (Signed)
[facility] [place], [place] [address]  Internal MEDICINE  Office Visit Note  Patient Name: Chelsea Rivera  [id number]  [id number]  Date of Service: 03/22/2019  Chief Complaint  Patient presents with  . [facility] of [facility] follow up , last friday pt blacked out , little bit of loss of energy, pt has hold off on taken lasix,    HPI Pt was taken to ED due to a syncopal event. Pt remembers passing out and felt like her sugar dropped. She drank some OJ but passed out and was unable to check her blood sugar. No sz activity was noticed. Pt was able to recall every thing when transferred to ED. She is a diabetic for over 15 years. No cardiac events were noted to be present. Pt was no loss of bowel or bladder during this event. In ED she was found be a little dehydrated with elevated BUN. She was also found to be anemic. On recent labs she was also found to have elevated potassium which has been investigated in the past. Pt was prescribed Lasix to treat Hyperkalemia. She also has Kayexalate at home. Agrees to see endo or renal for ongoing hyperkalemia  No echo or Carotid dopplers on file   Current Medication: Outpatient Encounter Medications as of 03/19/2019  Medication Sig Note  . allopurinol (ZYLOPRIM) 100 MG tablet Take by mouth. 05/14/2016: Received from: [place]: Take 100 mg by mouth.  Marland Kitchen aspirin EC 81 MG tablet Take 81 mg by mouth daily.   Marland Kitchen atorvastatin (LIPITOR) 10 MG tablet Take 1/2 tab po  Daily for high cholesterol   . bisoprolol (ZEBETA) 5 MG tablet Take 1 tablet (5 mg total) by mouth at bedtime.   . conjugated estrogens (PREMARIN) vaginal cream 1 applicator vaginally twice weekly   . cyclobenzaprine (FLEXERIL) 10 MG tablet Take 1 tablet (10 mg total) by mouth at bedtime.   . Dulaglutide (TRULICITY) 1.5 0000000 SOPN INJECT 1.5MG  ONCE A WEEK   . Insulin Detemir (LEVEMIR FLEXTOUCH) 100 UNIT/ML Pen  30 units at bedtime   . Insulin Pen Needle (BD PEN NEEDLE NANO U/F) 32G X 4 MM MISC Use as directed   . LINZESS 145 MCG CAPS capsule Take 1 capsule (145 mcg total) by mouth daily.   Marland Kitchen lisinopril (ZESTRIL) 2.5 MG tablet TAKE ONE TAB EVERY DAY   . metFORMIN (GLUCOPHAGE) 1000 MG tablet Take 1 tablet (1,000 mg total) by mouth 2 (two) times daily.   Marland Kitchen omeprazole (PRILOSEC) 40 MG capsule Take 1 capsule (40 mg total) by mouth daily.   . ondansetron (ZOFRAN) 8 MG tablet Take 1 tab po twice a daily as needed for nausea   . ONETOUCH ULTRA test strip TEST BLOOD SUGAR 3 TIMES DAILY AS DIRECTED   . phentermine (ADIPEX-P) 37.5 MG tablet Take 1 tablet (37.5 mg total) by mouth daily before breakfast.   . rifaximin (XIFAXAN) 550 MG TABS tablet Take 1 tablet (550 mg total) by mouth 2 (two) times daily.   Marland Kitchen zolpidem (AMBIEN) 10 MG tablet Take 1 tablet (10 mg total) by mouth at bedtime as needed.   . furosemide (LASIX) 20 MG tablet Take one tab po m/w/f (Patient not taking: Reported on 03/19/2019)   . SPS 15 GM/60ML suspension TAKE 120ML BY MOUTH ONCE FOR ONE DOSE THEN AS NEEDED PER DR INSTRUCTIONS   . [DISCONTINUED] benzonatate (TESSALON) 200 MG capsule Take 1 capsule (200 mg  total) by mouth 2 (two) times daily as needed for cough. (Patient not taking: Reported on 03/19/2019)   . [DISCONTINUED] metFORMIN (GLUCOPHAGE-XR) 750 MG 24 hr tablet Take by mouth. 05/14/2016: Received from: [place]: Take 750 mg by mouth 2 (two) times daily.   No facility-administered encounter medications on file as of 03/19/2019.     Surgical History: Past Surgical History:  Procedure Laterality Date  . BREAST BIOPSY Right 2012   cleaned out fistula, not a biopsy  . CHOLECYSTECTOMY    . COLONOSCOPY WITH PROPOFOL N/A 07/07/2018   Procedure: COLONOSCOPY WITH PROPOFOL;  Surgeon: Lollie Sails, MD;  Location: [facility] ENDOSCOPY;  Service: Endoscopy;  Laterality: N/A;  . SHOULDER ARTHROSCOPY      Medical  History: Past Medical History:  Diagnosis Date  . Diabetes mellitus without complication ([place])   . GERD (gastroesophageal reflux disease)   . Hyperlipidemia   . Hypertension     Family History: Family History  Problem Relation Age of Onset  . Lung cancer Mother   . Colon cancer Father   . Breast cancer Neg Hx     Social History   Socioeconomic History  . Marital status: Married    Spouse name: Not on file  . Number of children: Not on file  . Years of education: Not on file  . Highest education level: Not on file  Occupational History  . Not on file  Social Needs  . Financial resource strain: Not on file  . Food insecurity    Worry: Not on file    Inability: Not on file  . Transportation needs    Medical: Not on file    Non-medical: Not on file  Tobacco Use  . Smoking status: Never Smoker  . Smokeless tobacco: Never Used  Substance and Sexual Activity  . Alcohol use: Yes    Frequency: Never    Comment: socially  . Drug use: No  . Sexual activity: Not on file  Lifestyle  . Physical activity    Days per week: Not on file    Minutes per session: Not on file  . Stress: Not on file  Relationships  . Social Herbalist on phone: Not on file    Gets together: Not on file    Attends religious service: Not on file    Active member of club or organization: Not on file    Attends meetings of clubs or organizations: Not on file    Relationship status: Not on file  . Intimate partner violence    Fear of current or ex partner: Not on file    Emotionally abused: Not on file    Physically abused: Not on file    Forced sexual activity: Not on file  Other Topics Concern  . Not on file  Social History Narrative  . Not on file    Review of Systems  Constitutional: Negative for chills, fatigue and unexpected weight change.  HENT: Negative for congestion, postnasal drip, rhinorrhea, sneezing and sore throat.   Eyes: Negative for redness.  Respiratory:  Negative for cough, chest tightness and shortness of breath.   Cardiovascular: Negative for chest pain and palpitations.  Gastrointestinal: Negative for abdominal pain, constipation, diarrhea, nausea and vomiting.  Genitourinary: Negative for dysuria and frequency.  Musculoskeletal: Negative for arthralgias, back pain, joint swelling and neck pain.  Skin: Negative for rash.  Neurological: Positive for weakness. Negative for tremors and numbness.  Hematological: Negative for  adenopathy. Does not bruise/bleed easily.  Psychiatric/Behavioral: Negative for behavioral problems (Depression), sleep disturbance and suicidal ideas. The patient is not nervous/anxious.     Vital Signs: BP 140/70   Pulse 81   Temp (!) 97.4 F (36.3 C)   Resp 16   Ht 5\' 4"  (1.626 m)   Wt 158 lb (71.7 kg)   SpO2 98%   BMI 27.12 kg/m    Physical Exam Constitutional:      General: She is not in acute distress.    Appearance: She is well-developed. She is not diaphoretic.  HENT:     Head: Normocephalic and atraumatic.     Mouth/Throat:     Pharynx: No oropharyngeal exudate.  Eyes:     Pupils: Pupils are equal, round, and reactive to light.  Neck:     Musculoskeletal: Normal range of motion and neck supple.     Thyroid: No thyromegaly.     Vascular: No JVD.     Trachea: No tracheal deviation.  Cardiovascular:     Rate and Rhythm: Normal rate and regular rhythm.     Heart sounds: Normal heart sounds. No murmur. No friction rub. No gallop.   Pulmonary:     Effort: Pulmonary effort is normal. No respiratory distress.     Breath sounds: No wheezing or rales.  Chest:     Chest wall: No tenderness.  Abdominal:     General: Bowel sounds are normal.     Palpations: Abdomen is soft.  Musculoskeletal: Normal range of motion.  Lymphadenopathy:     Cervical: No cervical adenopathy.  Skin:    General: Skin is warm and dry.  Neurological:     Mental Status: She is alert and oriented to person, place, and  time.     Cranial Nerves: No cranial nerve deficit.  Psychiatric:        Behavior: Behavior normal.        Thought Content: Thought content normal.        Judgment: Judgment normal.    Assessment/Plan: 1. Syncope and collapse - Multi-factorial, however will need further work- up, carotid dopplers and Echo, monitor Bp, and glucose  - Basic Metabolic Panel (BMET)  2. 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 Iron deficiency anemia, unspecified iron deficiency anemia type - Recent Colonoscopy - B12 and Folate Panel - Fe+TIBC+Fer  4. Uncontrolled type 2 diabetes mellitus with hyperglycemia ([place]) - Controlled at this time.   General Counseling: jahmiyah fratello understanding of the findings of todays visit and agrees with plan of treatment. I have discussed any further diagnostic evaluation that may be needed or ordered today. We also reviewed her medications today. she has been encouraged to call the office with any questions or concerns that should arise related to todays visit.  Orders Placed This Encounter  Procedures  . Basic Metabolic Panel (BMET)  . Magnesium  . B12 and Folate Panel  . Fe+TIBC+Fer    Time spent:25 Minutes  Dr Lavera Guise Internal medicine

## 2019-03-23 ENCOUNTER — Other Ambulatory Visit: Payer: Self-pay | Admitting: Nurse Practitioner

## 2019-03-23 MED ORDER — METFORMIN HCL 1000 MG PO TABS: 1000.0000 mg | ORAL_TABLET | Freq: Two times a day (BID) | ORAL | 3 refills | Status: DC

## 2019-03-29 NOTE — Telephone Encounter (Signed)
Fwd: to provider for review.

## 2019-03-29 NOTE — Telephone Encounter (Signed)
Name of Caller: [place] phone number: [phone]    Relationship to Patient: Self    Provider: Dr Lilyan Punt    Practice:  Neurology    Chief Complaint/Reason for Call: Patient would like to know if she can switch the directions on how she is taking her Topamax. Keeps getting restless leg syndrome at night    Would like to know if she can take 1 1/2 tablets in the morning and 1 tablet at night instead    Best time of day caller can be reached: Anytime       Patient advised that office/PCP has 24-48 business hours to return their call:

## 2019-03-29 NOTE — Telephone Encounter (Signed)
Please advise patient she can take topiramate 100 mg as 1.5 tablet in the am and 1 tablet in the pm.

## 2019-03-29 NOTE — Telephone Encounter (Signed)
Message left for pt on voicemail to call back. Please give pt the message below.

## 2019-03-30 NOTE — Telephone Encounter (Signed)
Message released to patient as written.     Patient's further questions if applicable:     Advised Patient, expresses "thank you very much"    Were all questions from office addressed or relayed to the patient from encounter ? Yes

## 2019-04-13 ENCOUNTER — Other Ambulatory Visit: Payer: Self-pay | Admitting: Internal Medicine

## 2019-04-13 LAB — BASIC METABOLIC PANEL
BUN/Creatinine Ratio: 23 (ref 12–28)
BUN: 25 mg/dL (ref 8–27)
CO2: 22 mmol/L (ref 20–29)
Calcium: 10.6 mg/dL — ABNORMAL HIGH (ref 8.7–10.3)
Chloride: 99 mmol/L (ref 96–106)
Creatinine, Ser: 1.08 mg/dL — ABNORMAL HIGH (ref 0.57–1.00)
GFR calc Af Amer: 64 mL/min/{1.73_m2} (ref >59)
GFR calc non Af Amer: 56 mL/min/{1.73_m2} — ABNORMAL LOW (ref >59)
Glucose: 262 mg/dL — ABNORMAL HIGH (ref 65–99)
Potassium: 5.5 mmol/L — ABNORMAL HIGH (ref 3.5–5.2)
Sodium: 136 mmol/L (ref 134–144)

## 2019-04-13 LAB — B12 AND FOLATE PANEL
Folate: 17.8 ng/mL (ref >3.0)
Vitamin B-12: 1345 pg/mL — ABNORMAL HIGH (ref 232–1245)

## 2019-04-13 LAB — IRON,TIBC AND FERRITIN PANEL
Ferritin: 12 ng/mL — ABNORMAL LOW (ref 15–150)
Iron Saturation: 15 % (ref 15–55)
Iron: 59 ug/dL (ref 27–159)
Total Iron Binding Capacity: 381 ug/dL (ref 250–450)
UIBC: 322 ug/dL (ref 131–425)

## 2019-04-13 LAB — MAGNESIUM: Magnesium: 1.5 mg/dL — ABNORMAL LOW (ref 1.6–2.3)

## 2019-04-19 ENCOUNTER — Telehealth: Payer: Self-pay

## 2019-04-19 NOTE — Telephone Encounter (Signed)
LMOM FOR PATIENT TO CONFIRM AND SCREEN FOR 04-23-19 OV.

## 2019-04-20 ENCOUNTER — Telehealth: Payer: Self-pay

## 2019-04-20 NOTE — Telephone Encounter (Signed)
Confirmed appointment with patient. klh °

## 2019-04-23 ENCOUNTER — Other Ambulatory Visit: Payer: Self-pay

## 2019-04-23 ENCOUNTER — Encounter: Payer: Self-pay | Admitting: Nurse Practitioner

## 2019-04-23 ENCOUNTER — Ambulatory Visit (INDEPENDENT_AMBULATORY_CARE_PROVIDER_SITE_OTHER): Payer: BC Managed Care – PPO | Admitting: Nurse Practitioner

## 2019-04-23 VITALS — BP 110/64 | HR 80 | Temp 97.8°F | Ht 64.0 in | Wt 154.0 lb

## 2019-04-23 DIAGNOSIS — D509 Iron deficiency anemia, unspecified: Secondary | ICD-10-CM | POA: Diagnosis not present

## 2019-04-23 DIAGNOSIS — E1165 Type 2 diabetes mellitus with hyperglycemia: Secondary | ICD-10-CM

## 2019-04-23 DIAGNOSIS — E875 Hyperkalemia: Secondary | ICD-10-CM | POA: Diagnosis not present

## 2019-04-23 DIAGNOSIS — N289 Disorder of kidney and ureter, unspecified: Secondary | ICD-10-CM

## 2019-04-23 NOTE — Progress Notes (Signed)
[facility] [place], [place] [address]  Internal MEDICINE  Telephone Visit  Patient Name: Chelsea Rivera  [id number]  [id number]  Date of Service: 04/23/2019  I connected with the patient at 8:48am by webcam and verified the patients identity using two identifiers.   I discussed the limitations, risks, security and privacy concerns of performing an evaluation and management service by webcam and the availability of in person appointments. I also discussed with the patient that there may be a patient responsible charge related to the service.  The patient expressed understanding and agrees to proceed.    Chief Complaint  Patient presents with  . Telephone Assessment    blood sugar 104  . Telephone Screen    The patient has been contacted via webcam for follow up visit due to concerns for spread of novel coronavirus. She presents for follow up. She states that blood sugars have been doing so much better. States that her blood sugars are running about 110 or below. She did have episode of "passing out" a few weeks ago. She had labs drawn at the ER. Was found to have hyperkalemia and hypomagnesia. She also had mild iron deficiency anemia. A swecond check of these labs showed mild improvement, but they had not returned to normal. She states that she is feeling well. She has not had any further episodes of syncope. She denies chest pain, chest pressure, and shortness of breath. She denies abdominal pain, nausea, vomiting, or diarrhea.       Current Medication: Outpatient Encounter Medications as of 04/23/2019  Medication Sig Note  . allopurinol (ZYLOPRIM) 100 MG tablet Take by mouth. 05/14/2016: Received from: [place]: Take 100 mg by mouth.  Marland Kitchen aspirin EC 81 MG tablet Take 81 mg by mouth daily.   Marland Kitchen atorvastatin (LIPITOR) 10 MG tablet Take 1/2 tab po  Daily for high cholesterol   . bisoprolol (ZEBETA) 5 MG tablet Take 1 tablet (5 mg  total) by mouth at bedtime.   . conjugated estrogens (PREMARIN) vaginal cream 1 applicator vaginally twice weekly   . cyclobenzaprine (FLEXERIL) 10 MG tablet Take 1 tablet (10 mg total) by mouth at bedtime.   . Dulaglutide (TRULICITY) 1.5 0000000 SOPN INJECT 1.5MG  ONCE A WEEK   . Insulin Detemir (LEVEMIR FLEXTOUCH) 100 UNIT/ML Pen 30 units at bedtime   . Insulin Pen Needle (BD PEN NEEDLE NANO U/F) 32G X 4 MM MISC Use as directed   . LINZESS 145 MCG CAPS capsule Take 1 capsule (145 mcg total) by mouth daily.   Marland Kitchen lisinopril (ZESTRIL) 2.5 MG tablet TAKE ONE TAB EVERY DAY   . metFORMIN (GLUCOPHAGE) 1000 MG tablet Take 1 tablet (1,000 mg total) by mouth 2 (two) times daily.   Marland Kitchen omeprazole (PRILOSEC) 40 MG capsule Take 1 capsule (40 mg total) by mouth daily.   . ondansetron (ZOFRAN) 8 MG tablet Take 1 tab po twice a daily as needed for nausea   . ONETOUCH ULTRA test strip TEST BLOOD SUGAR 3 TIMES DAILY AS DIRECTED   . phentermine (ADIPEX-P) 37.5 MG tablet Take 1 tablet (37.5 mg total) by mouth daily before breakfast.   . rifaximin (XIFAXAN) 550 MG TABS tablet Take 1 tablet (550 mg total) by mouth 2 (two) times daily.   . SPS 15 GM/60ML suspension TAKE 120ML BY MOUTH ONCE FOR ONE DOSE THEN AS NEEDED PER DR INSTRUCTIONS   . zolpidem (AMBIEN) 10 MG tablet Take 1 tablet (10 mg  total) by mouth at bedtime as needed.    No facility-administered encounter medications on file as of 04/23/2019.    Surgical History: Past Surgical History:  Procedure Laterality Date  . BREAST BIOPSY Right 2012   cleaned out fistula, not a biopsy  . CHOLECYSTECTOMY    . COLONOSCOPY WITH PROPOFOL N/A 07/07/2018   Procedure: COLONOSCOPY WITH PROPOFOL;  Surgeon: Lollie Sails, MD;  Location: [facility] ENDOSCOPY;  Service: Endoscopy;  Laterality: N/A;  . SHOULDER ARTHROSCOPY      Medical History: Past Medical History:  Diagnosis Date  . Diabetes mellitus without complication ([place])   . GERD (gastroesophageal reflux  disease)   . Hyperlipidemia   . Hypertension     Family History: Family History  Problem Relation Age of Onset  . Lung cancer Mother   . Colon cancer Father   . Breast cancer Neg Hx     Social History   Socioeconomic History  . Marital status: Married    Spouse name: Not on file  . Number of children: Not on file  . Years of education: Not on file  . Highest education level: Not on file  Occupational History  . Not on file  Tobacco Use  . Smoking status: Never Smoker  . Smokeless tobacco: Never Used  Substance and Sexual Activity  . Alcohol use: Yes    Comment: socially  . Drug use: No  . Sexual activity: Not on file  Other Topics Concern  . Not on file  Social History Narrative  . Not on file   Social Determinants of Health   Financial Resource Strain:   . Difficulty of Paying Living Expenses: Not on file  Food Insecurity:   . Worried About Charity fundraiser in the Last Year: Not on file  . Ran Out of Food in the Last Year: Not on file  Transportation Needs:   . Lack of Transportation (Medical): Not on file  . Lack of Transportation (Non-Medical): Not on file  Physical Activity:   . Days of Exercise per Week: Not on file  . Minutes of Exercise per Session: Not on file  Stress:   . Feeling of Stress : Not on file  Social Connections:   . Frequency of Communication with Friends and Family: Not on file  . Frequency of Social Gatherings with Friends and Family: Not on file  . Attends Religious Services: Not on file  . Active Member of Clubs or Organizations: Not on file  . Attends Archivist Meetings: Not on file  . Marital Status: Not on file  Intimate Partner Violence:   . Fear of Current or Ex-Partner: Not on file  . Emotionally Abused: Not on file  . Physically Abused: Not on file  . Sexually Abused: Not on file      Review of Systems  Constitutional: Negative for activity change, chills, fatigue and unexpected weight change.  HENT:  Negative for congestion, postnasal drip, rhinorrhea, sneezing and sore throat.   Respiratory: Negative for cough, chest tightness, shortness of breath and wheezing.   Cardiovascular: Negative for chest pain and palpitations.  Gastrointestinal: Negative for abdominal pain, constipation, diarrhea, nausea and vomiting.  Endocrine: Negative for cold intolerance, heat intolerance, polydipsia and polyuria.       States that her blood sugars have been around 110. She states that some days it has been under 100.   Musculoskeletal: Negative for arthralgias, back pain, joint swelling and neck pain.  Skin: Negative for rash.  Allergic/Immunologic: Negative for environmental allergies.  Neurological: Negative for dizziness, tremors, numbness and headaches.  Hematological: Negative for adenopathy. Does not bruise/bleed easily.  Psychiatric/Behavioral: Negative for behavioral problems (Depression), sleep disturbance and suicidal ideas. The patient is not nervous/anxious.    Today's Vitals   04/23/19 0838  BP: 110/64  Pulse: 80  Temp: 97.8 F (36.6 C)  Weight: 154 lb (69.9 kg)  Height: 5\' 4"  (1.626 m)   Body mass index is 26.43 kg/m.  Observation/Objective:   The patient is alert and oriented. She is pleasant and answers all questions appropriately. Breathing is non-labored. She is in no acute distress at this time.    Assessment/Plan: 1. Uncontrolled type 2 diabetes mellitus with hyperglycemia (HCC) Blood sugars improved per patient report. Continue diabetic medication as prescribed. Check HgbA1c prior to next visit. Adjust medication as indicated.  - HgB A1c; Future  2. Hyperkalemia Slowly improving. Check BMP prior to next visit and treat as indicated.  - Basic Metabolic Panel (BMET); Future  3. Hypomagnesemia Recommend OTC magnesium supplement every day. Recheck magnesium level prior to next visit and treat as indicated.  - Magnesium; Future  4. Iron deficiency anemia, unspecified  iron deficiency anemia type Recheck CBC prior to next visit . - CBC, No Differential/Platelet; Future  General Counseling: donye eldredge understanding of the findings of today's phone visit and agrees with plan of treatment. I have discussed any further diagnostic evaluation that may be needed or ordered today. We also reviewed her medications today. she has been encouraged to call the office with any questions or concerns that should arise related to todays visit.  This patient was seen by Leretha Pol FNP Collaboration with Dr Lavera Guise as a part of collaborative care agreement  Orders Placed This Encounter  Procedures  . Basic Metabolic Panel (BMET)  . Magnesium  . CBC, No Differential/Platelet  . HgB A1c      Time spent: 20 Minutes    Dr Lavera Guise Internal medicine

## 2019-04-23 NOTE — Addendum Note (Signed)
Addended by: Leretha Pol on: 04/23/2019 01:49 PM   Modules accepted: Orders

## 2019-05-04 ENCOUNTER — Other Ambulatory Visit: Payer: Self-pay | Admitting: Internal Medicine

## 2019-05-04 DIAGNOSIS — R55 Syncope and collapse: Secondary | ICD-10-CM

## 2019-05-04 DIAGNOSIS — E875 Hyperkalemia: Secondary | ICD-10-CM

## 2019-05-04 DIAGNOSIS — E1165 Type 2 diabetes mellitus with hyperglycemia: Secondary | ICD-10-CM

## 2019-05-04 NOTE — Progress Notes (Signed)
Labs sent

## 2019-05-07 ENCOUNTER — Telehealth: Payer: Self-pay

## 2019-05-07 NOTE — Progress Notes (Signed)
Sure, she still needs labs repeated and Beth can get a note as well

## 2019-05-07 NOTE — Progress Notes (Signed)
Pt was notified.  

## 2019-05-07 NOTE — Progress Notes (Signed)
Ok. I called her back the day we talked and I referred her ASAP to nephrology. She was good with that plan.

## 2019-05-07 NOTE — Telephone Encounter (Signed)
Pt was notified to get labs done.

## 2019-05-07 NOTE — Telephone Encounter (Signed)
-----   Message from Lavera Guise, MD sent at 05/04/2019  9:02 AM EST ----- I sent an order for labs, please notify her. Pt will need syncopal work up as well

## 2019-05-24 ENCOUNTER — Ambulatory Visit
Admission: RE | Admit: 2019-05-24 | Discharge: 2019-05-24 | Disposition: A | Discharge status: Home / Self Care | Payer: BC Managed Care – PPO | Source: Ambulatory Visit | Attending: Internal Medicine | Admitting: Internal Medicine

## 2019-05-24 ENCOUNTER — Other Ambulatory Visit: Payer: Self-pay | Admitting: Internal Medicine

## 2019-05-24 DIAGNOSIS — Z1231 Encounter for screening mammogram for malignant neoplasm of breast: Secondary | ICD-10-CM | POA: Diagnosis present

## 2019-05-24 DIAGNOSIS — R928 Other abnormal and inconclusive findings on diagnostic imaging of breast: Secondary | ICD-10-CM

## 2019-05-24 DIAGNOSIS — N6489 Other specified disorders of breast: Secondary | ICD-10-CM

## 2019-05-25 NOTE — Progress Notes (Signed)
Additional views

## 2019-05-28 ENCOUNTER — Other Ambulatory Visit: Payer: Self-pay

## 2019-05-28 ENCOUNTER — Other Ambulatory Visit: Payer: Managed Care, Other (non HMO)

## 2019-05-28 DIAGNOSIS — E875 Hyperkalemia: Secondary | ICD-10-CM

## 2019-05-28 DIAGNOSIS — E1165 Type 2 diabetes mellitus with hyperglycemia: Secondary | ICD-10-CM | POA: Diagnosis not present

## 2019-05-28 DIAGNOSIS — R55 Syncope and collapse: Secondary | ICD-10-CM | POA: Diagnosis not present

## 2019-05-28 NOTE — Addendum Note (Signed)
Addended by: Jay Schlichter D on: 05/28/2019 09:11 AM   Modules accepted: Orders

## 2019-05-28 NOTE — Addendum Note (Signed)
Addended by: Jay Schlichter D on: 05/28/2019 09:12 AM   Modules accepted: Orders

## 2019-05-29 LAB — COMPREHENSIVE METABOLIC PANEL
ALT: 19 IU/L (ref 0–32)
AST: 20 IU/L (ref 0–40)
Albumin/Globulin Ratio: 1.5 (ref 1.2–2.2)
Albumin: 4.1 g/dL (ref 3.8–4.9)
Alkaline Phosphatase: 83 IU/L (ref 39–117)
BUN/Creatinine Ratio: 27 (ref 12–28)
BUN: 26 mg/dL (ref 8–27)
Bilirubin Total: <0.2 mg/dL (ref 0.0–1.2)
CO2: 26 mmol/L (ref 20–29)
Calcium: 10.3 mg/dL (ref 8.7–10.3)
Chloride: 102 mmol/L (ref 96–106)
Creatinine, Ser: 0.96 mg/dL (ref 0.57–1.00)
GFR calc Af Amer: 74 mL/min/{1.73_m2} (ref >59)
GFR calc non Af Amer: 64 mL/min/{1.73_m2} (ref >59)
Globulin, Total: 2.8 g/dL (ref 1.5–4.5)
Glucose: 129 mg/dL — ABNORMAL HIGH (ref 65–99)
Potassium: 5.5 mmol/L — ABNORMAL HIGH (ref 3.5–5.2)
Sodium: 141 mmol/L (ref 134–144)
Total Protein: 6.9 g/dL (ref 6.0–8.5)

## 2019-05-29 LAB — CALCIUM, IONIZED: Calcium, Ion: 5.3 mg/dL (ref 4.5–5.6)

## 2019-05-29 LAB — CBC, NO DIFFERENTIAL/PLATELET
Hematocrit: 36.4 % (ref 34.0–46.6)
Hemoglobin: 11.9 g/dL (ref 11.1–15.9)
MCH: 28.4 pg (ref 26.6–33.0)
MCHC: 32.7 g/dL (ref 31.5–35.7)
MCV: 87 fL (ref 79–97)
RBC: 4.19 x10E6/uL (ref 3.77–5.28)
RDW: 13 % (ref 11.7–15.4)
WBC: 5.9 10*3/uL (ref 3.4–10.8)

## 2019-05-29 LAB — HGB A1C W/O EAG: Hgb A1c MFr Bld: 8.8 % — ABNORMAL HIGH (ref 4.8–5.6)

## 2019-05-29 LAB — PHOSPHORUS: Phosphorus: 4.6 mg/dL — ABNORMAL HIGH (ref 3.0–4.3)

## 2019-05-29 LAB — PARATHYROID HORMONE, INTACT (NO CA): PTH: 24 pg/mL (ref 15–65)

## 2019-05-29 LAB — MAGNESIUM: Magnesium: 2.1 mg/dL (ref 1.6–2.3)

## 2019-05-29 NOTE — Progress Notes (Signed)
Waiting on all results

## 2019-05-31 ENCOUNTER — Telehealth: Payer: Self-pay

## 2019-05-31 NOTE — Telephone Encounter (Signed)
LMOM TO CONFIRM AND SCREEN AS IN OFFICE ON 06-04-19.

## 2019-06-01 ENCOUNTER — Ambulatory Visit
Admission: RE | Admit: 2019-06-01 | Discharge: 2019-06-01 | Disposition: A | Discharge status: Home / Self Care | Payer: BC Managed Care – PPO | Source: Ambulatory Visit | Attending: Internal Medicine | Admitting: Internal Medicine

## 2019-06-01 DIAGNOSIS — R928 Other abnormal and inconclusive findings on diagnostic imaging of breast: Secondary | ICD-10-CM | POA: Insufficient documentation

## 2019-06-01 DIAGNOSIS — N6489 Other specified disorders of breast: Secondary | ICD-10-CM | POA: Insufficient documentation

## 2019-06-04 ENCOUNTER — Ambulatory Visit (INDEPENDENT_AMBULATORY_CARE_PROVIDER_SITE_OTHER): Payer: Managed Care, Other (non HMO) | Admitting: Nurse Practitioner

## 2019-06-04 ENCOUNTER — Other Ambulatory Visit: Payer: Self-pay

## 2019-06-04 ENCOUNTER — Other Ambulatory Visit: Payer: Self-pay | Admitting: Nephrology

## 2019-06-04 ENCOUNTER — Encounter: Payer: Self-pay | Admitting: Nurse Practitioner

## 2019-06-04 VITALS — BP 125/56 | HR 79 | Temp 97.5°F | Resp 16 | Ht 64.0 in | Wt 159.6 lb

## 2019-06-04 DIAGNOSIS — Z0001 Encounter for general adult medical examination with abnormal findings: Secondary | ICD-10-CM

## 2019-06-04 DIAGNOSIS — N1831 Chronic kidney disease, stage 3a: Secondary | ICD-10-CM

## 2019-06-04 DIAGNOSIS — K5909 Other constipation: Secondary | ICD-10-CM

## 2019-06-04 DIAGNOSIS — M109 Gout, unspecified: Secondary | ICD-10-CM | POA: Insufficient documentation

## 2019-06-04 DIAGNOSIS — E875 Hyperkalemia: Secondary | ICD-10-CM

## 2019-06-04 DIAGNOSIS — N189 Chronic kidney disease, unspecified: Secondary | ICD-10-CM | POA: Insufficient documentation

## 2019-06-04 DIAGNOSIS — R635 Abnormal weight gain: Secondary | ICD-10-CM

## 2019-06-04 DIAGNOSIS — I129 Hypertensive chronic kidney disease with stage 1 through stage 4 chronic kidney disease, or unspecified chronic kidney disease: Secondary | ICD-10-CM | POA: Insufficient documentation

## 2019-06-04 DIAGNOSIS — I10 Essential (primary) hypertension: Secondary | ICD-10-CM

## 2019-06-04 DIAGNOSIS — Z124 Encounter for screening for malignant neoplasm of cervix: Secondary | ICD-10-CM

## 2019-06-04 DIAGNOSIS — E1165 Type 2 diabetes mellitus with hyperglycemia: Secondary | ICD-10-CM | POA: Diagnosis not present

## 2019-06-04 DIAGNOSIS — R300 Dysuria: Secondary | ICD-10-CM

## 2019-06-04 DIAGNOSIS — D631 Anemia in chronic kidney disease: Secondary | ICD-10-CM | POA: Insufficient documentation

## 2019-06-04 MED ORDER — PHENTERMINE HCL 37.5 MG PO TABS: 37.5000 mg | ORAL_TABLET | Freq: Every day | ORAL | 2 refills | Status: DC

## 2019-06-04 MED ORDER — LEVEMIR FLEXTOUCH 100 UNIT/ML ~~LOC~~ SOPN: PEN_INJECTOR | SUBCUTANEOUS | 3 refills | Status: DC

## 2019-06-04 MED ORDER — LINACLOTIDE 290 MCG PO CAPS: 290.0000 ug | ORAL_CAPSULE | Freq: Every day | ORAL | 3 refills | Status: DC

## 2019-06-04 NOTE — Progress Notes (Signed)
Patient sees nephrology 06/04/2019. She had cut back her Levemir. We increased it back to initial dose.

## 2019-06-04 NOTE — Progress Notes (Signed)
Please review

## 2019-06-04 NOTE — Progress Notes (Signed)
[facility] [place], [place] [address]  Internal MEDICINE  Office Visit Note  Patient Name: Chelsea Rivera  [id number]  [id number]  Date of Service: 06/06/2019  Chief Complaint  Patient presents with  . Annual Exam  . Gynecologic Exam  . Diabetes    Ms. Staples presents to clinic today for an annual physical. She reports that her blood sugars have been difficult to manage. She is trying to cut back use of insulin. Her HgbA1c today is 9.4, up from 8.8 one week ago. She states that she has been trying to maintain a heart healthy diet since her husband had an MI last summer. She also reports that she has been having some constipation since decreasing her Linzess dose to 145mg  from 290mg . She had a screening mammogram last week. She does have dense breast tissue, but mammogram was negative after additional images. Most recent labs show improving renal functions. Potassium and phosphorus levels are still mildly elevated. She is scheduled to see nephrologist later today.       Current Medication: Outpatient Encounter Medications as of 06/04/2019  Medication Sig Note  . allopurinol (ZYLOPRIM) 100 MG tablet Take by mouth. 05/14/2016: Received from: [place]: Take 100 mg by mouth.  Marland Kitchen aspirin EC 81 MG tablet Take 81 mg by mouth daily.   Marland Kitchen atorvastatin (LIPITOR) 10 MG tablet Take 1/2 tab po  Daily for high cholesterol   . bisoprolol (ZEBETA) 5 MG tablet Take 1 tablet (5 mg total) by mouth at bedtime. (Patient taking differently: Take 1/2 tab by mouth daily.)   . conjugated estrogens (PREMARIN) vaginal cream 1 applicator vaginally twice weekly   . cyclobenzaprine (FLEXERIL) 10 MG tablet Take 1 tablet (10 mg total) by mouth at bedtime.   . Dulaglutide (TRULICITY) 1.5 0000000 SOPN INJECT 1.5MG  ONCE A WEEK   . Insulin Detemir (LEVEMIR FLEXTOUCH) 100 UNIT/ML Pen 35 units at bedtime   . Insulin Pen Needle (BD PEN NEEDLE NANO U/F) 32G X 4 MM  MISC Use as directed   . lisinopril (ZESTRIL) 2.5 MG tablet TAKE ONE TAB EVERY DAY   . metFORMIN (GLUCOPHAGE) 1000 MG tablet Take 1 tablet (1,000 mg total) by mouth 2 (two) times daily.   Marland Kitchen omeprazole (PRILOSEC) 40 MG capsule Take 1 capsule (40 mg total) by mouth daily.   . ondansetron (ZOFRAN) 8 MG tablet Take 1 tab po twice a daily as needed for nausea   . ONETOUCH ULTRA test strip TEST BLOOD SUGAR 3 TIMES DAILY AS DIRECTED   . phentermine (ADIPEX-P) 37.5 MG tablet Take 1 tablet (37.5 mg total) by mouth daily before breakfast.   . rifaximin (XIFAXAN) 550 MG TABS tablet Take 1 tablet (550 mg total) by mouth 2 (two) times daily.   . SPS 15 GM/60ML suspension TAKE 120ML BY MOUTH ONCE FOR ONE DOSE THEN AS NEEDED PER DR INSTRUCTIONS   . zolpidem (AMBIEN) 10 MG tablet Take 1 tablet (10 mg total) by mouth at bedtime as needed.   . [DISCONTINUED] Insulin Detemir (LEVEMIR FLEXTOUCH) 100 UNIT/ML Pen 30 units at bedtime   . [DISCONTINUED] LINZESS 145 MCG CAPS capsule Take 1 capsule (145 mcg total) by mouth daily.   . [DISCONTINUED] phentermine (ADIPEX-P) 37.5 MG tablet Take 1 tablet (37.5 mg total) by mouth daily before breakfast.   . linaclotide (LINZESS) 290 MCG CAPS capsule Take 1 capsule (290 mcg total) by mouth daily before breakfast.    No facility-administered encounter medications on  file as of 06/04/2019.    Surgical History: Past Surgical History:  Procedure Laterality Date  . BREAST BIOPSY Right 2012   cleaned out fistula, not a biopsy  . CHOLECYSTECTOMY    . COLONOSCOPY WITH PROPOFOL N/A 07/07/2018   Procedure: COLONOSCOPY WITH PROPOFOL;  Surgeon: Lollie Sails, MD;  Location: [facility] ENDOSCOPY;  Service: Endoscopy;  Laterality: N/A;  . SHOULDER ARTHROSCOPY      Medical History: Past Medical History:  Diagnosis Date  . Diabetes mellitus without complication ([place])   . GERD (gastroesophageal reflux disease)   . Hyperlipidemia   . Hypertension     Family History: Family History   Problem Relation Age of Onset  . Lung cancer Mother   . Colon cancer Father   . Breast cancer Neg Hx     Social History   Socioeconomic History  . Marital status: Married    Spouse name: Not on file  . Number of children: Not on file  . Years of education: Not on file  . Highest education level: Not on file  Occupational History  . Not on file  Tobacco Use  . Smoking status: Never Smoker  . Smokeless tobacco: Never Used  Substance and Sexual Activity  . Alcohol use: Yes    Comment: socially  . Drug use: No  . Sexual activity: Not on file  Other Topics Concern  . Not on file  Social History Narrative  . Not on file   Social Determinants of Health   Financial Resource Strain:   . Difficulty of Paying Living Expenses: Not on file  Food Insecurity:   . Worried About Charity fundraiser in the Last Year: Not on file  . Ran Out of Food in the Last Year: Not on file  Transportation Needs:   . Lack of Transportation (Medical): Not on file  . Lack of Transportation (Non-Medical): Not on file  Physical Activity:   . Days of Exercise per Week: Not on file  . Minutes of Exercise per Session: Not on file  Stress:   . Feeling of Stress : Not on file  Social Connections:   . Frequency of Communication with Friends and Family: Not on file  . Frequency of Social Gatherings with Friends and Family: Not on file  . Attends Religious Services: Not on file  . Active Member of Clubs or Organizations: Not on file  . Attends Archivist Meetings: Not on file  . Marital Status: Not on file  Intimate Partner Violence:   . Fear of Current or Ex-Partner: Not on file  . Emotionally Abused: Not on file  . Physically Abused: Not on file  . Sexually Abused: Not on file      Review of Systems  Constitutional: Positive for activity change and fatigue. Negative for appetite change and unexpected weight change.       Decreased activity due to changes in work and lifestyle related  to COVID restrictions  Fatigue related to extended work periods/computer use  HENT: Positive for sinus pressure and sinus pain. Negative for hearing loss.        Reports occasional sinus pain and pressure  Respiratory: Negative for cough, shortness of breath and wheezing.   Cardiovascular: Negative for chest pain and palpitations.  Gastrointestinal: Positive for constipation and nausea. Negative for abdominal pain.       Nausea with weekly Trulicity injection, takes prn ondansetron which is effective   Endocrine: Negative for cold intolerance, heat intolerance and polydipsia.  Patient trying to decrease dosing of insulin. Blood sugars more elevated.   Genitourinary: Negative for difficulty urinating, dysuria, hematuria, pelvic pain, urgency and vaginal discharge.  Musculoskeletal: Negative for arthralgias, gait problem and myalgias.  Skin: Negative for rash and wound.  Neurological: Positive for headaches. Negative for numbness.       HA's with prolonged computer use  Psychiatric/Behavioral: Negative for dysphoric mood and sleep disturbance. The patient is nervous/anxious.        Some situational anxiety related to work, high blood sugar results    Today's Vitals   06/04/19 0857  BP: (!) 125/56  Pulse: 79  Resp: 16  Temp: (!) 97.5 F (36.4 C)  SpO2: 100%  Weight: 159 lb 9.6 oz (72.4 kg)   Body mass index is 27.4 kg/m.   Physical Exam Vitals and nursing note reviewed. Exam conducted with a chaperone present.  Constitutional:      General: She is not in acute distress.    Appearance: Normal appearance.  HENT:     Head: Normocephalic.     Right Ear: Tympanic membrane, ear canal and external ear normal. There is no impacted cerumen.     Left Ear: Tympanic membrane, ear canal and external ear normal. There is no impacted cerumen.     Nose: Nose normal.  Eyes:     Conjunctiva/sclera: Conjunctivae normal.     Pupils: Pupils are equal, round, and reactive to light.    Cardiovascular:     Rate and Rhythm: Normal rate and regular rhythm.     Pulses: Normal pulses.          Dorsalis pedis pulses are 2+ on the right side and 2+ on the left side.     Heart sounds: Normal heart sounds.  Pulmonary:     Effort: Pulmonary effort is normal.     Breath sounds: Normal breath sounds.  Abdominal:     General: Bowel sounds are normal. There is no distension.     Palpations: Abdomen is soft. There is no mass.     Tenderness: There is no abdominal tenderness.     Hernia: There is no hernia in the left inguinal area or right inguinal area.  Genitourinary:    General: Normal vulva.     Exam position: Supine.     Labia:        Right: No tenderness or lesion.        Left: No tenderness or lesion.      Vagina: No vaginal discharge or erythema.     Cervix: No cervical motion tenderness, discharge or erythema.     Adnexa: Right adnexa normal and left adnexa normal.       Right: No mass or tenderness.         Left: No mass or tenderness.       Comments: No tenderness, masses, or organomeglay present during bimanual exam . Musculoskeletal:        General: Normal range of motion.     Cervical back: Normal range of motion and neck supple.     Right lower leg: No edema.     Left lower leg: No edema.     Right foot: No deformity.     Left foot: No deformity.       Feet:  Feet:     Right foot:     Protective Sensation: 10 sites tested. 10 sites sensed.     Skin integrity: No callus.     Toenail Condition: Right toenails  are normal.     Left foot:     Protective Sensation: 10 sites tested. 10 sites sensed.     Skin integrity: No callus.     Toenail Condition: Left toenails are normal.  Lymphadenopathy:     Lower Body: No right inguinal adenopathy. No left inguinal adenopathy.  Skin:    General: Skin is warm and dry.     Capillary Refill: Capillary refill takes 2 to 3 seconds.  Neurological:     General: No focal deficit present.     Mental Status: She is alert  and oriented to person, place, and time.     Deep Tendon Reflexes:     Reflex Scores:      Patellar reflexes are 2+ on the right side and 2+ on the left side. Psychiatric:        Attention and Perception: Attention normal.        Mood and Affect: Mood and affect normal.        Behavior: Behavior normal.        Thought Content: Thought content normal.        Judgment: Judgment normal.    Assessment/Plan: 1. Encounter for general adult medical examination with abnormal findings Annual health maintenance exam and pap smear today.   2. Uncontrolled type 2 diabetes mellitus with hyperglycemia (HCC) - POCT glycosylated hemoglobin (Hb A1C) 9.4 today. Increase levemir dose to 35 units daily. Continue trulicity 1.5mg  weekly and metformin daily.monitor blood sugars closely. Adjust levemir dosing as indicate.d  - Insulin Detemir (LEVEMIR FLEXTOUCH) 100 UNIT/ML Pen; 35 units at bedtime  Dispense: 15 mL; Refill: 3  3. Chronic constipation Increase dose of Linzess to 255mcg daily. A high fiber diet with plenty of fluids (up to 8 glasses of water daily) is suggested to relieve these symptoms.  Metamucil, 1 tablespoon once or twice daily can be used to keep bowels regular if needed. - linaclotide (LINZESS) 290 MCG CAPS capsule; Take 1 capsule (290 mcg total) by mouth daily before breakfast.  Dispense: 30 capsule; Refill: 3  4. Benign hypertension Stable. Continue bp medications as prescribed   5. Hyperkalemia Potassium continues to be mildly elevated. Patient scheduled to see nephrologist today.   6. Abnormal weight gain May continue phentermine, nomore than every other day. Limit calorie intake to [phone] calories per day. Incorporate exercise into daily routine.  - phentermine (ADIPEX-P) 37.5 MG tablet; Take 1 tablet (37.5 mg total) by mouth daily before breakfast.  Dispense: 30 tablet; Refill: 2  7. Routine cervical smear - IGP, Aptima HPV  8. Dysuria - UA/M w/rflx Culture,  Routine  General Counseling: jharline legro understanding of the findings of todays visit and agrees with plan of treatment. I have discussed any further diagnostic evaluation that may be needed or ordered today. We also reviewed her medications today. she has been encouraged to call the office with any questions or concerns that should arise related to todays visit.  Diabetes Counseling:  1. Addition of ACE inh/ ARB'S for nephroprotection. Microalbumin is updated  2. Diabetic foot care, prevention of complications. Podiatry consult 3. Exercise and lose weight.  4. Diabetic eye examination, Diabetic eye exam is updated  5. Monitor blood sugar closlely. nutrition counseling.  6. Sign and symptoms of hypoglycemia including shaking sweating,confusion and headaches.  This patient was seen by Leretha Pol FNP Collaboration with Dr Lavera Guise as a part of collaborative care agreement  Orders Placed This Encounter  Procedures  . Microscopic Examination  .  Urine Culture, Reflex  . UA/M w/rflx Culture, Routine  . POCT glycosylated hemoglobin (Hb A1C)    Meds ordered this encounter  Medications  . Insulin Detemir (LEVEMIR FLEXTOUCH) 100 UNIT/ML Pen    Sig: 35 units at bedtime    Dispense:  15 mL    Refill:  3    Order Specific Question:   Supervising Provider    Answer:   Lavera Guise [place]  . linaclotide (LINZESS) 290 MCG CAPS capsule    Sig: Take 1 capsule (290 mcg total) by mouth daily before breakfast.    Dispense:  30 capsule    Refill:  3    Order Specific Question:   Supervising Provider    Answer:   Lavera Guise [id number]  . phentermine (ADIPEX-P) 37.5 MG tablet    Sig: Take 1 tablet (37.5 mg total) by mouth daily before breakfast.    Dispense:  30 tablet    Refill:  2    Please place on hold until patient ready for refill.    Order Specific Question:   Supervising Provider    Answer:   Lavera Guise [id number]    Total time spent: 6 Minutes   Time spent includes  review of chart, medications, test results, and follow up plan with the patient.      Dr Lavera Guise Internal medicine

## 2019-06-05 ENCOUNTER — Other Ambulatory Visit: Payer: Self-pay | Admitting: Nephrology

## 2019-06-05 DIAGNOSIS — D649 Anemia, unspecified: Secondary | ICD-10-CM

## 2019-06-05 DIAGNOSIS — N1831 Chronic kidney disease, stage 3a: Secondary | ICD-10-CM

## 2019-06-05 DIAGNOSIS — E1122 Type 2 diabetes mellitus with diabetic chronic kidney disease: Secondary | ICD-10-CM

## 2019-06-06 DIAGNOSIS — Z0001 Encounter for general adult medical examination with abnormal findings: Secondary | ICD-10-CM | POA: Insufficient documentation

## 2019-06-06 DIAGNOSIS — Z124 Encounter for screening for malignant neoplasm of cervix: Secondary | ICD-10-CM | POA: Insufficient documentation

## 2019-06-06 DIAGNOSIS — K5909 Other constipation: Secondary | ICD-10-CM | POA: Insufficient documentation

## 2019-06-06 LAB — UA/M W/RFLX CULTURE, ROUTINE
Bilirubin, UA: NEGATIVE
Nitrite, UA: NEGATIVE
Protein,UA: NEGATIVE
RBC, UA: NEGATIVE
Specific Gravity, UA: 1.027 (ref 1.005–1.030)
Urobilinogen, Ur: 0.2 mg/dL (ref 0.2–1.0)
pH, UA: 5 (ref 5.0–7.5)

## 2019-06-06 LAB — POCT GLYCOSYLATED HEMOGLOBIN (HGB A1C): Hemoglobin A1C: 9.4 % — AB (ref 4.0–5.6)

## 2019-06-06 LAB — MICROSCOPIC EXAMINATION
Casts: NONE SEEN /lpf
Epithelial Cells (non renal): NONE SEEN /hpf (ref 0–10)

## 2019-06-06 LAB — URINE CULTURE, REFLEX

## 2019-06-11 ENCOUNTER — Ambulatory Visit
Admission: RE | Admit: 2019-06-11 | Discharge: 2019-06-11 | Disposition: A | Discharge status: Home / Self Care | Payer: BC Managed Care – PPO | Source: Ambulatory Visit | Attending: Nephrology | Admitting: Nephrology

## 2019-06-11 ENCOUNTER — Other Ambulatory Visit: Payer: Self-pay

## 2019-06-11 DIAGNOSIS — D649 Anemia, unspecified: Secondary | ICD-10-CM | POA: Insufficient documentation

## 2019-06-11 DIAGNOSIS — N1831 Chronic kidney disease, stage 3a: Secondary | ICD-10-CM | POA: Diagnosis not present

## 2019-06-11 DIAGNOSIS — E1122 Type 2 diabetes mellitus with diabetic chronic kidney disease: Secondary | ICD-10-CM

## 2019-06-12 ENCOUNTER — Other Ambulatory Visit: Payer: Self-pay

## 2019-06-12 DIAGNOSIS — F5101 Primary insomnia: Secondary | ICD-10-CM

## 2019-06-12 LAB — IGP, APTIMA HPV: HPV Aptima: NEGATIVE

## 2019-06-13 MED ORDER — ZOLPIDEM TARTRATE 10 MG PO TABS: 10.0000 mg | ORAL_TABLET | Freq: Every evening | ORAL | 3 refills | Status: DC | PRN

## 2019-06-13 NOTE — Progress Notes (Signed)
Please let the patient know that pap smear was normal. Thanks

## 2019-06-13 NOTE — Progress Notes (Signed)
Pt advised pap smear was normal 

## 2019-06-21 ENCOUNTER — Other Ambulatory Visit: Payer: Self-pay

## 2019-06-21 MED ORDER — TRULICITY 1.5 MG/0.5ML ~~LOC~~ SOAJ: SUBCUTANEOUS | 3 refills | Status: DC

## 2019-07-13 ENCOUNTER — Telehealth: Payer: Self-pay

## 2019-07-13 NOTE — Telephone Encounter (Signed)
WRITTEN ORDER FOR GLUCOSE SUPPLIES SIGNED AND FAXED BACK TO [facility].

## 2019-07-18 ENCOUNTER — Inpatient Hospital Stay: Admit: 2019-07-18 | Attending: Internal Medicine | Primary: Internal Medicine

## 2019-07-20 ENCOUNTER — Other Ambulatory Visit: Payer: Self-pay

## 2019-07-20 MED ORDER — TRULICITY 1.5 MG/0.5ML ~~LOC~~ SOAJ: SUBCUTANEOUS | 3 refills | Status: DC

## 2019-07-20 MED ORDER — METFORMIN HCL 1000 MG PO TABS: 1000.0000 mg | ORAL_TABLET | Freq: Two times a day (BID) | ORAL | 3 refills | Status: DC

## 2019-07-20 MED ORDER — OMEPRAZOLE 40 MG PO CPDR: 40.0000 mg | DELAYED_RELEASE_CAPSULE | Freq: Every day | ORAL | 5 refills | Status: DC

## 2019-08-22 ENCOUNTER — Other Ambulatory Visit: Payer: Self-pay

## 2019-08-22 MED ORDER — ATORVASTATIN CALCIUM 10 MG PO TABS: ORAL_TABLET | ORAL | 5 refills | Status: DC

## 2019-08-30 ENCOUNTER — Telehealth: Payer: Self-pay

## 2019-08-30 NOTE — Telephone Encounter (Signed)
Confirmed and screened for 09-03-19 ov. 

## 2019-09-03 ENCOUNTER — Other Ambulatory Visit: Payer: Self-pay

## 2019-09-03 ENCOUNTER — Ambulatory Visit (INDEPENDENT_AMBULATORY_CARE_PROVIDER_SITE_OTHER): Payer: Managed Care, Other (non HMO) | Admitting: Nurse Practitioner

## 2019-09-03 ENCOUNTER — Encounter: Payer: Self-pay | Admitting: Nurse Practitioner

## 2019-09-03 VITALS — BP 119/60 | HR 98 | Temp 78.0°F | Resp 16 | Ht 64.0 in | Wt 158.8 lb

## 2019-09-03 DIAGNOSIS — R635 Abnormal weight gain: Secondary | ICD-10-CM

## 2019-09-03 DIAGNOSIS — K5909 Other constipation: Secondary | ICD-10-CM | POA: Diagnosis not present

## 2019-09-03 DIAGNOSIS — Z794 Long term (current) use of insulin: Secondary | ICD-10-CM

## 2019-09-03 DIAGNOSIS — I10 Essential (primary) hypertension: Secondary | ICD-10-CM

## 2019-09-03 DIAGNOSIS — E1165 Type 2 diabetes mellitus with hyperglycemia: Secondary | ICD-10-CM | POA: Diagnosis not present

## 2019-09-03 LAB — POCT GLYCOSYLATED HEMOGLOBIN (HGB A1C): Hemoglobin A1C: 8.3 % — AB (ref 4.0–5.6)

## 2019-09-03 MED ORDER — PHENTERMINE HCL 37.5 MG PO TABS: 37.5000 mg | ORAL_TABLET | Freq: Every day | ORAL | 2 refills | Status: DC

## 2019-09-03 MED ORDER — INSULIN LISPRO 100 UNIT/ML ~~LOC~~ SOLN: SUBCUTANEOUS | 11 refills | Status: DC

## 2019-09-03 NOTE — Progress Notes (Signed)
[facility] [place], [place] [address]  Internal MEDICINE  Office Visit Note  Patient Name: Chelsea Rivera  [id number]  [id number]  Date of Service: 09/03/2019  Chief Complaint  Patient presents with  . Diabetes  . Hypertension  . Hyperlipidemia  . Gastroesophageal Reflux  . Medication Management    discuss insulin dosage    The patient is here for regular follow up. Blood sugars are improving. HgbA1c 8.3 today, down from 9.4 at her last check. She is now using a Dexcom glucose monitoring system. This is allowing her to have constant blood sugar monitoring. She states that the biggest problem is late in the afternoon. Sugars will run in the low to mid 100s. Overnight, blood sugars tend tp drop, even into the 50s.  She does continue to take phentermine every other day. She has lost one pound since her last visit. She has no negative side effects associated with taking this medication. Blood pressure is well managed.  She has started seeing nephrologist. Lisinopril was discontinued. Renal functions have improved and blood pressure remains well managed.  Has had both Pfizer COVID 19 vaccines. These are documented in immunization record.        Current Medication: Outpatient Encounter Medications as of 09/03/2019  Medication Sig Note  . allopurinol (ZYLOPRIM) 100 MG tablet Take by mouth. 05/14/2016: Received from: [place]: Take 100 mg by mouth.  Marland Kitchen aspirin EC 81 MG tablet Take 81 mg by mouth daily.   Marland Kitchen atorvastatin (LIPITOR) 10 MG tablet Take 1/2 tab po  Daily for high cholesterol   . bisoprolol (ZEBETA) 5 MG tablet Take 1 tablet (5 mg total) by mouth at bedtime. (Patient taking differently: Take 1/2 tab by mouth daily.)   . conjugated estrogens (PREMARIN) vaginal cream 1 applicator vaginally twice weekly   . cyclobenzaprine (FLEXERIL) 10 MG tablet Take 1 tablet (10 mg total) by mouth at bedtime.   . Dulaglutide (TRULICITY)  1.5 0000000 SOPN INJECT 1.5MG  ONCE A WEEK   . Insulin Detemir (LEVEMIR FLEXTOUCH) 100 UNIT/ML Pen 35 units at bedtime   . Insulin Pen Needle (BD PEN NEEDLE NANO U/F) 32G X 4 MM MISC Use as directed   . linaclotide (LINZESS) 290 MCG CAPS capsule Take 1 capsule (290 mcg total) by mouth daily before breakfast.   . metFORMIN (GLUCOPHAGE) 1000 MG tablet Take 1 tablet (1,000 mg total) by mouth 2 (two) times daily.   Marland Kitchen omeprazole (PRILOSEC) 40 MG capsule Take 1 capsule (40 mg total) by mouth daily.   . ondansetron (ZOFRAN) 8 MG tablet Take 1 tab po twice a daily as needed for nausea   . ONETOUCH ULTRA test strip TEST BLOOD SUGAR 3 TIMES DAILY AS DIRECTED   . phentermine (ADIPEX-P) 37.5 MG tablet Take 1 tablet (37.5 mg total) by mouth daily before breakfast.   . rifaximin (XIFAXAN) 550 MG TABS tablet Take 1 tablet (550 mg total) by mouth 2 (two) times daily.   . SPS 15 GM/60ML suspension TAKE 120ML BY MOUTH ONCE FOR ONE DOSE THEN AS NEEDED PER DR INSTRUCTIONS   . zolpidem (AMBIEN) 10 MG tablet Take 1 tablet (10 mg total) by mouth at bedtime as needed.   . [DISCONTINUED] phentermine (ADIPEX-P) 37.5 MG tablet Take 1 tablet (37.5 mg total) by mouth daily before breakfast.   . insulin lispro (HUMALOG) 100 UNIT/ML injection Inject [place] BID prior to meals as directed per sliding scale. Max daily dose is 30 units.  DX E11.65   . [DISCONTINUED] lisinopril (ZESTRIL) 2.5 MG tablet TAKE ONE TAB EVERY DAY (Patient not taking: Reported on 09/03/2019)    No facility-administered encounter medications on file as of 09/03/2019.    Surgical History: Past Surgical History:  Procedure Laterality Date  . BREAST BIOPSY Right 2012   cleaned out fistula, not a biopsy  . CHOLECYSTECTOMY    . COLONOSCOPY WITH PROPOFOL N/A 07/07/2018   Procedure: COLONOSCOPY WITH PROPOFOL;  Surgeon: Lollie Sails, MD;  Location: [facility] ENDOSCOPY;  Service: Endoscopy;  Laterality: N/A;  . SHOULDER ARTHROSCOPY      Medical History: Past  Medical History:  Diagnosis Date  . Diabetes mellitus without complication ([place])   . GERD (gastroesophageal reflux disease)   . Hyperlipidemia   . Hypertension     Family History: Family History  Problem Relation Age of Onset  . Lung cancer Mother   . Colon cancer Father   . Breast cancer Neg Hx     Social History   Socioeconomic History  . Marital status: Married    Spouse name: Not on file  . Number of children: Not on file  . Years of education: Not on file  . Highest education level: Not on file  Occupational History  . Not on file  Tobacco Use  . Smoking status: Never Smoker  . Smokeless tobacco: Never Used  Substance and Sexual Activity  . Alcohol use: Yes    Comment: socially  . Drug use: No  . Sexual activity: Not on file  Other Topics Concern  . Not on file  Social History Narrative  . Not on file   Social Determinants of Health   Financial Resource Strain:   . Difficulty of Paying Living Expenses:   Food Insecurity:   . Worried About Charity fundraiser in the Last Year:   . Arboriculturist in the Last Year:   Transportation Needs:   . Film/video editor (Medical):   Marland Kitchen Lack of Transportation (Non-Medical):   Physical Activity:   . Days of Exercise per Week:   . Minutes of Exercise per Session:   Stress:   . Feeling of Stress :   Social Connections:   . Frequency of Communication with Friends and Family:   . Frequency of Social Gatherings with Friends and Family:   . Attends Religious Services:   . Active Member of Clubs or Organizations:   . Attends Archivist Meetings:   Marland Kitchen Marital Status:   Intimate Partner Violence:   . Fear of Current or Ex-Partner:   . Emotionally Abused:   Marland Kitchen Physically Abused:   . Sexually Abused:       Review of Systems  Constitutional: Positive for activity change and fatigue. Negative for appetite change and unexpected weight change.       Decreased activity due to changes in work and lifestyle  related to COVID restrictions  Fatigue related to extended work periods/computer use  HENT: Positive for sinus pressure and sinus pain. Negative for hearing loss.        Reports occasional sinus pain and pressure  Respiratory: Negative for cough, shortness of breath and wheezing.   Cardiovascular: Negative for chest pain and palpitations.  Gastrointestinal: Positive for constipation and nausea. Negative for abdominal pain.       Nausea with weekly Trulicity injection, takes prn ondansetron which is effective   Endocrine: Negative for cold intolerance, heat intolerance and polydipsia.  Blood sugars have improved, overall. Has high readings in the late afternoon and evening. Has low readings in the middle of the night. Using a Dexcom glucometer. Allows her to follow her blood sugars at all times.   Genitourinary: Negative for difficulty urinating, dysuria, hematuria, pelvic pain, urgency and vaginal discharge.  Musculoskeletal: Negative for arthralgias, gait problem and myalgias.  Skin: Negative for rash and wound.  Neurological: Negative for numbness and headaches.       HA's with prolonged computer use  Psychiatric/Behavioral: Negative for dysphoric mood and sleep disturbance. The patient is nervous/anxious.        Some situational anxiety related to work, high blood sugar results    Today's Vitals   09/03/19 0834  BP: 119/60  Pulse: 98  Resp: 16  Temp: (!) 78 F (25.6 C)  SpO2: 97%  Weight: 158 lb 12.8 oz (72 kg)  Height: 5\' 4"  (1.626 m)   Body mass index is 27.26 kg/m.  Physical Exam Vitals and nursing note reviewed.  Constitutional:      General: She is not in acute distress.    Appearance: Normal appearance. She is well-developed. She is not diaphoretic.  HENT:     Head: Normocephalic and atraumatic.     Nose: Nose normal.     Mouth/Throat:     Pharynx: No oropharyngeal exudate.  Eyes:     Pupils: Pupils are equal, round, and reactive to light.  Neck:      Thyroid: No thyromegaly.     Vascular: No carotid bruit or JVD.     Trachea: No tracheal deviation.  Cardiovascular:     Rate and Rhythm: Normal rate and regular rhythm.     Heart sounds: Normal heart sounds. No murmur. No friction rub. No gallop.   Pulmonary:     Effort: Pulmonary effort is normal. No respiratory distress.     Breath sounds: Normal breath sounds. No wheezing or rales.  Chest:     Chest wall: No tenderness.  Abdominal:     Palpations: Abdomen is soft.  Musculoskeletal:        General: Normal range of motion.     Cervical back: Normal range of motion and neck supple.  Lymphadenopathy:     Cervical: No cervical adenopathy.  Skin:    General: Skin is warm and dry.  Neurological:     Mental Status: She is alert and oriented to person, place, and time.     Cranial Nerves: No cranial nerve deficit.  Psychiatric:        Mood and Affect: Mood normal.        Behavior: Behavior normal.        Thought Content: Thought content normal.        Judgment: Judgment normal.   Assessment/Plan: 1. Type 2 diabetes mellitus with hyperglycemia, with long-term current use of insulin (HCC) - POCT HgB A1C 8.3 today, down from 9.4 at her last check. Will add mealtime coverage of blood sugars with regular insulin per sliding scale. Instructions were provided. Patient voiced understanding.  - insulin lispro (HUMALOG) 100 UNIT/ML injection; Inject [place] BID prior to meals as directed per sliding scale. Max daily dose is 30 units. DX E11.65  Dispense: 10 mL; Refill: 11  2. Benign hypertension Stable. Continue bp medication as prescribed. Lisinopril d/c per nephrology. Blood pressure managing well.   3. Chronic constipation Use linzess as prescribed   4. Abnormal weight gain Improving. May continue phentermine 37.5mg  daily. Limit calorie intake to 1200  calories per day. Incorporate exercise into daily routine.  - phentermine (ADIPEX-P) 37.5 MG tablet; Take 1 tablet (37.5 mg total) by mouth  daily before breakfast.  Dispense: 30 tablet; Refill: 2  General Counseling: kahmya padalino understanding of the findings of todays visit and agrees with plan of treatment. I have discussed any further diagnostic evaluation that may be needed or ordered today. We also reviewed her medications today. she has been encouraged to call the office with any questions or concerns that should arise related to todays visit.  Diabetes Counseling:  1. Addition of ACE inh/ ARB'S for nephroprotection. Microalbumin is updated  2. Diabetic foot care, prevention of complications. Podiatry consult 3. Exercise and lose weight.  4. Diabetic eye examination, Diabetic eye exam is updated  5. Monitor blood sugar closlely. nutrition counseling.  6. Sign and symptoms of hypoglycemia including shaking sweating,confusion and headaches.  This patient was seen by Leretha Pol FNP Collaboration with Dr Lavera Guise as a part of collaborative care agreement  Orders Placed This Encounter  Procedures  . POCT HgB A1C    Meds ordered this encounter  Medications  . insulin lispro (HUMALOG) 100 UNIT/ML injection    Sig: Inject [place] BID prior to meals as directed per sliding scale. Max daily dose is 30 units. DX E11.65    Dispense:  10 mL    Refill:  11    Please substitute as her insurance prefers    Order Specific Question:   Supervising Provider    Answer:   Lavera Guise [[id number]]  . phentermine (ADIPEX-P) 37.5 MG tablet    Sig: Take 1 tablet (37.5 mg total) by mouth daily before breakfast.    Dispense:  30 tablet    Refill:  2    Please place on hold until patient ready for refill.    Order Specific Question:   Supervising Provider    Answer:   Lavera Guise [id number]    Total time spent: 30 Minutes  Time spent includes review of chart, medications, test results, and follow up plan with the patient.      Dr Lavera Guise Internal medicine

## 2019-09-17 ENCOUNTER — Other Ambulatory Visit: Payer: Self-pay

## 2019-09-17 DIAGNOSIS — F5101 Primary insomnia: Secondary | ICD-10-CM

## 2019-09-21 ENCOUNTER — Other Ambulatory Visit: Payer: Self-pay

## 2019-09-21 DIAGNOSIS — K5909 Other constipation: Secondary | ICD-10-CM

## 2019-09-21 MED ORDER — LINACLOTIDE 290 MCG PO CAPS: 290.0000 ug | ORAL_CAPSULE | Freq: Every day | ORAL | 3 refills | Status: DC

## 2019-10-05 MED ORDER — ZOLPIDEM TARTRATE 10 MG PO TABS: 10.0000 mg | ORAL_TABLET | Freq: Every evening | ORAL | 3 refills | Status: DC | PRN

## 2019-10-17 ENCOUNTER — Other Ambulatory Visit: Payer: Self-pay

## 2019-10-17 DIAGNOSIS — R110 Nausea: Secondary | ICD-10-CM

## 2019-10-17 MED ORDER — ONDANSETRON HCL 8 MG PO TABS: ORAL_TABLET | ORAL | 2 refills | Status: DC

## 2019-10-25 ENCOUNTER — Other Ambulatory Visit: Payer: Self-pay

## 2019-10-25 MED ORDER — TRULICITY 1.5 MG/0.5ML ~~LOC~~ SOAJ: SUBCUTANEOUS | 3 refills | Status: DC

## 2019-11-13 ENCOUNTER — Other Ambulatory Visit: Payer: Self-pay

## 2019-11-13 DIAGNOSIS — E1165 Type 2 diabetes mellitus with hyperglycemia: Secondary | ICD-10-CM

## 2019-11-13 MED ORDER — LEVEMIR FLEXTOUCH 100 UNIT/ML ~~LOC~~ SOPN: PEN_INJECTOR | SUBCUTANEOUS | 3 refills | Status: DC

## 2019-11-21 ENCOUNTER — Other Ambulatory Visit: Payer: Self-pay

## 2019-11-21 MED ORDER — METFORMIN HCL 1000 MG PO TABS: 1000.0000 mg | ORAL_TABLET | Freq: Two times a day (BID) | ORAL | 3 refills | Status: DC

## 2019-11-26 ENCOUNTER — Other Ambulatory Visit: Payer: Self-pay | Admitting: Nurse Practitioner

## 2019-11-26 ENCOUNTER — Encounter: Payer: Self-pay | Admitting: Nurse Practitioner

## 2019-11-26 DIAGNOSIS — R635 Abnormal weight gain: Secondary | ICD-10-CM

## 2019-11-26 MED ORDER — PHENTERMINE HCL 37.5 MG PO TABS: 37.5000 mg | ORAL_TABLET | Freq: Every day | ORAL | 2 refills | Status: DC

## 2019-11-29 ENCOUNTER — Telehealth: Payer: Self-pay

## 2019-11-29 NOTE — Telephone Encounter (Signed)
Confirmed and screened for 12-03-19 ov.

## 2019-12-03 ENCOUNTER — Ambulatory Visit (INDEPENDENT_AMBULATORY_CARE_PROVIDER_SITE_OTHER): Payer: Managed Care, Other (non HMO) | Admitting: Nurse Practitioner

## 2019-12-03 ENCOUNTER — Encounter: Payer: Self-pay | Admitting: Nurse Practitioner

## 2019-12-03 ENCOUNTER — Other Ambulatory Visit: Payer: Self-pay

## 2019-12-03 VITALS — BP 139/71 | HR 82 | Temp 97.2°F | Resp 16 | Ht 64.0 in | Wt 157.8 lb

## 2019-12-03 DIAGNOSIS — I10 Essential (primary) hypertension: Secondary | ICD-10-CM | POA: Diagnosis not present

## 2019-12-03 DIAGNOSIS — Z794 Long term (current) use of insulin: Secondary | ICD-10-CM | POA: Diagnosis not present

## 2019-12-03 DIAGNOSIS — K5909 Other constipation: Secondary | ICD-10-CM

## 2019-12-03 DIAGNOSIS — S80862A Insect bite (nonvenomous), left lower leg, initial encounter: Secondary | ICD-10-CM | POA: Insufficient documentation

## 2019-12-03 DIAGNOSIS — E1165 Type 2 diabetes mellitus with hyperglycemia: Secondary | ICD-10-CM | POA: Diagnosis not present

## 2019-12-03 LAB — POCT GLYCOSYLATED HEMOGLOBIN (HGB A1C): Hemoglobin A1C: 8 % — AB (ref 4.0–5.6)

## 2019-12-03 MED ORDER — DEXCOM G6 SENSOR MISC: 3 refills | Status: DC

## 2019-12-03 MED ORDER — DEXCOM G6 TRANSMITTER MISC: 3 refills | Status: DC

## 2019-12-03 NOTE — Progress Notes (Signed)
[facility] [place], [place] [address]  Internal MEDICINE  Office Visit Note  Patient Name: Chelsea Rivera  [id number]  [id number]  Date of Service: 12/03/2019  Chief Complaint  Patient presents with  . Follow-up  . Diabetes  . Gastroesophageal Reflux  . Hypertension  . Hyperlipidemia  . Quality Metric Gaps    TDAP, Eye Exam    The patient is here for routine follow up. Her blood sugar control is improving. Her HgbA1c is 8.0, down from 8.3 at her last check. She is now using the Dexcom continuous glucose monitor. She states that this is helping her manage her sugars. Helping her to determine which foods really cause her blood sugar to go up an which ones her body manages better. She is using basal insulin and will use sliding scale insulin for mealtime coverage. She is due to have a diabetic eye exam.  She has bug bite she got yesterday or the day before. She states that it is itchy and a little tender.       Current Medication: Outpatient Encounter Medications as of 12/03/2019  Medication Sig Note  . allopurinol (ZYLOPRIM) 100 MG tablet Take by mouth. 05/14/2016: Received from: [place]: Take 100 mg by mouth.  Marland Kitchen aspirin EC 81 MG tablet Take 81 mg by mouth daily.   Marland Kitchen atorvastatin (LIPITOR) 10 MG tablet Take 1/2 tab po  Daily for high cholesterol   . bisoprolol (ZEBETA) 5 MG tablet Take 1 tablet (5 mg total) by mouth at bedtime. (Patient taking differently: Take 1/2 tab by mouth daily.)   . conjugated estrogens (PREMARIN) vaginal cream 1 applicator vaginally twice weekly   . cyclobenzaprine (FLEXERIL) 10 MG tablet Take 1 tablet (10 mg total) by mouth at bedtime.   . Dulaglutide (TRULICITY) 1.5 GN/5.6OZ SOPN INJECT 1.5MG  ONCE A WEEK   . insulin detemir (LEVEMIR FLEXTOUCH) 100 UNIT/ML FlexPen 35 units at bedtime   . insulin lispro (HUMALOG) 100 UNIT/ML injection Inject [place] BID prior to meals as directed per sliding scale. Max  daily dose is 30 units. DX E11.65   . Insulin Pen Needle (BD PEN NEEDLE NANO U/F) 32G X 4 MM MISC Use as directed   . linaclotide (LINZESS) 290 MCG CAPS capsule Take 1 capsule (290 mcg total) by mouth daily before breakfast.   . metFORMIN (GLUCOPHAGE) 1000 MG tablet Take 1 tablet (1,000 mg total) by mouth 2 (two) times daily.   Marland Kitchen omeprazole (PRILOSEC) 40 MG capsule Take 1 capsule (40 mg total) by mouth daily.   . ondansetron (ZOFRAN) 8 MG tablet Take 1 tab po twice a daily as needed for nausea   . ONETOUCH ULTRA test strip TEST BLOOD SUGAR 3 TIMES DAILY AS DIRECTED   . phentermine (ADIPEX-P) 37.5 MG tablet Take 1 tablet (37.5 mg total) by mouth daily before breakfast.   . rifaximin (XIFAXAN) 550 MG TABS tablet Take 1 tablet (550 mg total) by mouth 2 (two) times daily.   . SPS 15 GM/60ML suspension TAKE 120ML BY MOUTH ONCE FOR ONE DOSE THEN AS NEEDED PER DR INSTRUCTIONS   . zolpidem (AMBIEN) 10 MG tablet Take 1 tablet (10 mg total) by mouth at bedtime as needed.   . Continuous Blood Gluc Sensor (DEXCOM G6 SENSOR) MISC To use as directed with Dexcom sensor for continuous glucose monitoring.   . Continuous Blood Gluc Transmit (DEXCOM G6 TRANSMITTER) MISC Use as directed for continuous glucose monitoring  - E11.65  No facility-administered encounter medications on file as of 12/03/2019.    Surgical History: Past Surgical History:  Procedure Laterality Date  . BREAST BIOPSY Right 2012   cleaned out fistula, not a biopsy  . CHOLECYSTECTOMY    . COLONOSCOPY WITH PROPOFOL N/A 07/07/2018   Procedure: COLONOSCOPY WITH PROPOFOL;  Surgeon: Lollie Sails, MD;  Location: [facility] ENDOSCOPY;  Service: Endoscopy;  Laterality: N/A;  . SHOULDER ARTHROSCOPY      Medical History: Past Medical History:  Diagnosis Date  . Diabetes mellitus without complication ([place])   . GERD (gastroesophageal reflux disease)   . Hyperlipidemia   . Hypertension     Family History: Family History  Problem Relation  Age of Onset  . Lung cancer Mother   . Colon cancer Father   . Breast cancer Neg Hx     Social History   Socioeconomic History  . Marital status: Married    Spouse name: Not on file  . Number of children: Not on file  . Years of education: Not on file  . Highest education level: Not on file  Occupational History  . Not on file  Tobacco Use  . Smoking status: Never Smoker  . Smokeless tobacco: Never Used  Vaping Use  . Vaping Use: Never used  Substance and Sexual Activity  . Alcohol use: Yes    Comment: socially  . Drug use: No  . Sexual activity: Not on file  Other Topics Concern  . Not on file  Social History Narrative  . Not on file   Social Determinants of Health   Financial Resource Strain:   . Difficulty of Paying Living Expenses:   Food Insecurity:   . Worried About Charity fundraiser in the Last Year:   . Arboriculturist in the Last Year:   Transportation Needs:   . Film/video editor (Medical):   Marland Kitchen Lack of Transportation (Non-Medical):   Physical Activity:   . Days of Exercise per Week:   . Minutes of Exercise per Session:   Stress:   . Feeling of Stress :   Social Connections:   . Frequency of Communication with Friends and Family:   . Frequency of Social Gatherings with Friends and Family:   . Attends Religious Services:   . Active Member of Clubs or Organizations:   . Attends Archivist Meetings:   Marland Kitchen Marital Status:   Intimate Partner Violence:   . Fear of Current or Ex-Partner:   . Emotionally Abused:   Marland Kitchen Physically Abused:   . Sexually Abused:       Review of Systems  Constitutional: Negative for activity change, chills, fatigue and unexpected weight change.  HENT: Negative for congestion, postnasal drip, rhinorrhea, sneezing and sore throat.   Respiratory: Negative for cough, chest tightness, shortness of breath and wheezing.   Cardiovascular: Negative for chest pain and palpitations.  Gastrointestinal: Negative for  abdominal pain, constipation, diarrhea, nausea and vomiting.  Endocrine: Negative for cold intolerance, heat intolerance, polydipsia and polyuria.       Improving blood sugars.  Musculoskeletal: Negative for arthralgias, back pain, joint swelling and neck pain.  Skin: Negative for rash.       Small bug bite to outer aspect of the left knee which is very itchy and a little tender.   Allergic/Immunologic: Negative for environmental allergies.  Neurological: Negative for dizziness, tremors, numbness and headaches.  Hematological: Negative for adenopathy. Does not bruise/bleed easily.  Psychiatric/Behavioral: Positive for sleep disturbance.  Negative for behavioral problems (Depression) and suicidal ideas. The patient is not nervous/anxious.     Today's Vitals   12/03/19 0919  BP: (!) 139/71  Pulse: 82  Resp: 16  Temp: (!) 97.2 F (36.2 C)  SpO2: 100%  Weight: 157 lb 12.8 oz (71.6 kg)  Height: 5\' 4"  (1.626 m)   Body mass index is 27.09 kg/m.  Physical Exam Vitals and nursing note reviewed.  Constitutional:      General: She is not in acute distress.    Appearance: Normal appearance. She is well-developed. She is not diaphoretic.  HENT:     Head: Normocephalic and atraumatic.     Nose: Nose normal.     Mouth/Throat:     Pharynx: No oropharyngeal exudate.  Eyes:     Pupils: Pupils are equal, round, and reactive to light.  Neck:     Thyroid: No thyromegaly.     Vascular: No carotid bruit or JVD.     Trachea: No tracheal deviation.  Cardiovascular:     Rate and Rhythm: Normal rate and regular rhythm.     Heart sounds: Normal heart sounds. No murmur heard.  No friction rub. No gallop.   Pulmonary:     Effort: Pulmonary effort is normal. No respiratory distress.     Breath sounds: Normal breath sounds. No wheezing or rales.  Chest:     Chest wall: No tenderness.  Abdominal:     Palpations: Abdomen is soft.  Musculoskeletal:        General: Normal range of motion.      Cervical back: Normal range of motion and neck supple.  Lymphadenopathy:     Cervical: No cervical adenopathy.  Skin:    General: Skin is warm and dry.     Comments: Small, round, raised insect bite. There is no redness or warmth associated with the lesion. Skin intact with no drainage.   Neurological:     Mental Status: She is alert and oriented to person, place, and time.     Cranial Nerves: No cranial nerve deficit.  Psychiatric:        Mood and Affect: Mood normal.        Behavior: Behavior normal.        Thought Content: Thought content normal.        Judgment: Judgment normal.    Assessment/Plan: 1. Type 2 diabetes mellitus with hyperglycemia, with long-term current use of insulin (HCC) - POCT HgB A1C 8.0, improved from last check at 8.3. continue all diabetic medication as prescribed. Continue use of Dexcom blood sugar monitoring. New orders placed for pharmacy to fill. Refer for diabetic eye exam.  - Continuous Blood Gluc Transmit (DEXCOM G6 TRANSMITTER) MISC; Use as directed for continuous glucose monitoring  - E11.65  Dispense: 1 each; Refill: 3 - Continuous Blood Gluc Sensor (DEXCOM G6 SENSOR) MISC; To use as directed with Dexcom sensor for continuous glucose monitoring.  Dispense: 3 each; Refill: 3 - Ambulatory referral to Ophthalmology  2. Benign hypertension Stable. Continue bp medication as prescribed   3. Chronic constipation Use linzess as prescribed   4. Insect bite (nonvenomous), left lower leg, initial encounter (CODE) Benign appearing insect bite to lateral aspect of left knee. Encouraged her to apply hydrocortisone cream to bite as needed and as indicated. Monitor closely.   General Counseling: niyati heinke understanding of the findings of todays visit and agrees with plan of treatment. I have discussed any further diagnostic evaluation that may be needed or ordered  today. We also reviewed her medications today. she has been encouraged to call the office with  any questions or concerns that should arise related to todays visit.  This patient was seen by Leretha Pol FNP Collaboration with Dr Lavera Guise as a part of collaborative care agreement  Orders Placed This Encounter  Procedures  . Ambulatory referral to Ophthalmology  . POCT HgB A1C    Meds ordered this encounter  Medications  . Continuous Blood Gluc Transmit (DEXCOM G6 TRANSMITTER) MISC    Sig: Use as directed for continuous glucose monitoring  - E11.65    Dispense:  1 each    Refill:  3    Please fill as 90 day prescription    Order Specific Question:   Supervising Provider    Answer:   Lavera Guise [[id number]]  . Continuous Blood Gluc Sensor (DEXCOM G6 SENSOR) MISC    Sig: To use as directed with Dexcom sensor for continuous glucose monitoring.    Dispense:  3 each    Refill:  3    Please fill as 90 day prescription    Order Specific Question:   Supervising Provider    Answer:   Lavera Guise [[id number]]    Total time spent: 30 Minutes   Time spent includes review of chart, medications, test results, and follow up plan with the patient.      Dr Lavera Guise Internal medicine

## 2019-12-04 MED ORDER — TOPIRAMATE 100 MG PO TABS
100 MG | ORAL_TABLET | ORAL | 0 refills | Status: AC
Start: 2019-12-04 — End: ?

## 2019-12-04 NOTE — Telephone Encounter (Signed)
Requested Prescriptions     Signed Prescriptions Disp Refills   ??? topiramate (TOPAMAX) 100 MG tablet 225 tablet 0     Sig: TAKE 1 TABLET BY MOUTH IN THE MORNING AND 1 & 1/2 (ONE & ONE-HALF) TABS IN THE EVENING     Authorizing Provider: Gabriel Carina

## 2019-12-24 ENCOUNTER — Other Ambulatory Visit: Payer: Self-pay

## 2019-12-24 MED ORDER — OMEPRAZOLE 40 MG PO CPDR: 40.0000 mg | DELAYED_RELEASE_CAPSULE | Freq: Every day | ORAL | 5 refills | Status: DC

## 2020-01-22 ENCOUNTER — Other Ambulatory Visit: Payer: Self-pay

## 2020-01-22 DIAGNOSIS — K5909 Other constipation: Secondary | ICD-10-CM

## 2020-01-22 MED ORDER — LINACLOTIDE 290 MCG PO CAPS: 290.0000 ug | ORAL_CAPSULE | Freq: Every day | ORAL | 3 refills | Status: DC

## 2020-01-23 ENCOUNTER — Other Ambulatory Visit: Payer: Self-pay

## 2020-01-23 DIAGNOSIS — F5101 Primary insomnia: Secondary | ICD-10-CM

## 2020-01-23 MED ORDER — ZOLPIDEM TARTRATE 10 MG PO TABS: 10.0000 mg | ORAL_TABLET | Freq: Every evening | ORAL | 0 refills | Status: DC | PRN

## 2020-01-23 NOTE — Telephone Encounter (Signed)
Called in phar for ambien 10 mg as per dfk with no refills

## 2020-01-28 ENCOUNTER — Ambulatory Visit: Admit: 2020-01-28 | Discharge: 2020-01-28 | Payer: MEDICAID | Attending: Neurology | Primary: Internal Medicine

## 2020-01-28 DIAGNOSIS — G40909 Epilepsy, unspecified, not intractable, without status epilepticus: Secondary | ICD-10-CM

## 2020-02-04 ENCOUNTER — Other Ambulatory Visit: Payer: Self-pay

## 2020-02-04 MED ORDER — TRULICITY 1.5 MG/0.5ML ~~LOC~~ SOAJ: SUBCUTANEOUS | 3 refills | Status: DC

## 2020-02-15 ENCOUNTER — Inpatient Hospital Stay: Admit: 2020-02-15 | Attending: Physician Assistant | Primary: Internal Medicine

## 2020-02-15 DIAGNOSIS — D329 Benign neoplasm of meninges, unspecified: Secondary | ICD-10-CM

## 2020-02-15 MED ORDER — GADOBUTROL 1 MMOL/ML IV SOLN
1 MMOL/ML | Freq: Once | INTRAVENOUS | Status: AC | PRN
Start: 2020-02-15 — End: 2020-02-15
  Administered 2020-02-15: 19:00:00 6 mL via INTRAVENOUS

## 2020-02-19 ENCOUNTER — Ambulatory Visit
Admit: 2020-02-19 | Discharge: 2020-02-19 | Payer: MEDICAID | Attending: Neurological Surgery | Primary: Internal Medicine

## 2020-02-19 DIAGNOSIS — D329 Benign neoplasm of meninges, unspecified: Secondary | ICD-10-CM

## 2020-02-19 NOTE — Progress Notes (Signed)
NEUROSURGERY and SPINE FOLLOW-UP NOTE      Patient Name: Kelly Miles  Patient DOB: [birth year]  MRN: [id number]       PCP: Jeanett Schlein, MD      History of Present Ilness: 59 y.o. presents with f/u. She still has chronic HA's. Had surgery for meningioma 2 years ago. Also c/o neck pain and arm pain and tingling down to hands. Did PT previously for this. Takes Tramadol. Arm sx are worsening. Some LBP, occasional radiating pain down the legs.   She thinks she had one seizure after surgery, over 1 year ago.  No further seizures.  She is seeing Dr. Lilyan Punt who has recommended continuing Topamax.    Chief Complaint   Patient presents with   ??? Follow-up     To review MRI               Past Medical History:        Diagnosis Date   ??? Allergic rhinitis    ??? Breast cancer ([place])    ??? Chronic back pain    ??? Colitis    ??? Fibromyalgia    ??? Hypertension    ??? Hypothyroidism    ??? Irritable bowel syndrome    ??? Neuropathy    ??? Osteoarthritis     back   ??? Restless legs syndrome        Past Surgical History:        Procedure Laterality Date   ??? BREAST SURGERY     ??? CHOLECYSTECTOMY     ??? COLONOSCOPY     ??? CRANIAL LESION RESECTION  2019   ??? INNER EAR SURGERY      both sides   ??? TONSILLECTOMY AND ADENOIDECTOMY         Home Medications:   Prior to Admission medications    Medication Sig Start Date End Date Taking? Authorizing Provider   topiramate (TOPAMAX) 100 MG tablet TAKE 1 TABLET BY MOUTH IN THE MORNING AND 1 & 1/2 (ONE & ONE-HALF) TABS IN THE EVENING 12/04/19  Yes Gabriel Carina, APRN - CNP   traMADol (ULTRAM) 50 MG tablet Take 50 mg by mouth nightly.   Yes Historical Provider, MD   topiramate (TOPAMAX) 100 MG tablet Take 100 mg by mouth 2 times daily   Yes Historical Provider, MD   DIPHENOXYLATE-ATROPINE PO Take 2.5 mg by mouth 4 times daily For Diarrhea/Colitis   Yes Historical Provider, MD   irbesartan (AVAPRO) 300 MG tablet Take 300 mg by mouth daily    Yes Historical Provider, MD   aspirin 81 MG chewable tablet Take 81 mg by  mouth daily Patient stopped per Dr. Christiana Fuchs 10 days prior to surgery   Yes Historical Provider, MD   hydrochlorothiazide (HYDRODIURIL) 25 MG tablet Take 25 mg by mouth daily   Yes Historical Provider, MD   budesonide (ENTOCORT EC) 3 MG extended release capsule Take 6 mg by mouth every morning   Yes Historical Provider, MD   cholestyramine (QUESTRAN) 4 g packet Take 1 packet by mouth 2 times daily    Yes Historical Provider, MD   Menthol, Topical Analgesic, (BIOFREEZE COLORLESS EX) Apply topically as needed    Yes Historical Provider, MD   tiZANidine (ZANAFLEX) 4 MG tablet Take 4 mg by mouth 2 times daily  03/11/17  Yes Historical Provider, MD   Multiple Vitamins-Iron (TAB-A-VITE/IRON) TABS Take 1 tablet by mouth daily  03/01/17  Yes Historical Provider, MD   amitriptyline (ELAVIL) 10  MG tablet Take 30 mg by mouth nightly  02/16/17  Yes Historical Provider, MD   SYNTHROID 112 MCG tablet Take 112 mcg by mouth daily  06/24/14  Yes Historical Provider, MD   cetirizine (ZYRTEC) 10 MG tablet Take 10 mg by mouth daily  06/24/14  Yes Historical Provider, MD   vitamin D (CHOLECALCIFEROL) 1000 UNIT TABS tablet Take 2,000 Units by mouth daily    Yes Historical Provider, MD           Allergies: Iodine, Adhesive tape, Ciprofloxacin, Oxaprozin, Gabapentin, and Influenza vaccines    Social History:      TOBACCO:   reports that she quit smoking about 42 years ago. Her smoking use included cigarettes. She has a 0.25 pack-year smoking history. She has never used smokeless tobacco.  ETOH:   reports previous alcohol use.  RECREATIONAL DRUG USE:   Social History     Substance and Sexual Activity   Drug Use No       Family History:           Problem Relation Age of Onset   ??? Other Brother         allergies   ??? Brain Cancer Brother    ??? High Blood Pressure Mother    ??? Stroke Mother    ??? Heart Disease Father    ??? High Blood Pressure Sister    ??? Other Sister         allergies   ??? Mult Sclerosis Sister              Review of  Systems:    Review of Systems   Constitutional: Negative for activity change and unexpected weight change.   HENT: Negative for hearing loss and trouble swallowing.    Eyes: Negative for pain and visual disturbance.   Respiratory: Negative for cough and shortness of breath.    Cardiovascular: Negative for chest pain and leg swelling.   Gastrointestinal: Negative for abdominal distention and abdominal pain.   Genitourinary: Negative for difficulty urinating and urgency.   Musculoskeletal: Positive for back pain and neck pain.   Neurological: Positive for numbness and headaches. Negative for weakness.   Hematological: Negative for adenopathy. Does not bruise/bleed easily.   Psychiatric/Behavioral: Negative for behavioral problems and confusion.         Physical Examination:    Vitals:    02/19/20 1311   BP: 98/63   Pulse: 96   Resp: 16   Temp: 97.5 ??F (36.4 ??C)       Physical Exam  Constitutional:       Appearance: Normal appearance. She is well-developed.   HENT:      Head: Normocephalic and atraumatic.   Eyes:      Conjunctiva/sclera: Conjunctivae normal.      Comments: Pupils equal     Cardiovascular:      Comments: No peripheral edema  Pulmonary:      Effort: Pulmonary effort is normal. No respiratory distress.   Abdominal:      Palpations: Abdomen is soft.      Tenderness: There is no abdominal tenderness.   Musculoskeletal:      Cervical back: Normal range of motion and neck supple.   Skin:     General: Skin is warm and dry.   Neurological:      Mental Status: She is alert.      Coordination: Coordination normal.      Deep Tendon Reflexes:      Reflex  Scores:       Bicep reflexes are 2+ on the right side and 2+ on the left side.       Patellar reflexes are 2+ on the right side and 2+ on the left side.       Achilles reflexes are 2+ on the right side and 2+ on the left side.     Comments: Motor 5/5 UE and LE  Sensation intact LT  DTR's 2+ biceps, achilles and patella   Psychiatric:         Mood and Affect: Mood  normal.         Speech: Speech normal.         Thought Content: Thought content normal.          Well-healed left cranial incision  Gait  normal      Results  Labs:  Last 24hrs  No results found for this or any previous visit (from the past 24 hour(s)).    Radiology Personal review:  MRI brain reveals no residual or recurrent enhancing mass    ASSESSMENT / PLAN :     59 year old as post left craniotomy for meningioma resection almost 2 years ago.  She has no recurrence with expected postop changes on MRI brain.  She also continues to complain of neck pain and bilateral arm pain and numbness which is consistent with cervical radiculopathy.  Her MRI from 2 years ago did reveal moderate cervical stenosis C5-7.  I will order physical therapy as well as a cervical MRI to further evaluate and then have her return back to see me for discussions of surgical options.  C5-7 ACDF may be reasonable to improve her cervical radicular symptoms.  I will plan an MRI of the brain with and without contrast in 1-1/2 years, or earlier if concerns.  For now she is continuing Topamax.      1. Meningioma ([place])  - MRI BRAIN W WO CONTRAST; Future  - MRI Cervical Spine WO Contrast; Future  - [facility] [place]    2. Cervical spinal stenosis  - MRI Cervical Spine WO Contrast; Future  - [facility] [place]      I have spent 30 minutes reviewing previous notes, test results, and face to face with the patient discussing the diagnosis and importance of compliance with the treatment plan as well as documenting on the day of the visit.       Electronically signed by Clayborn Bigness, MD on 02/19/2020 at 1:59 PM

## 2020-02-25 ENCOUNTER — Other Ambulatory Visit: Payer: Self-pay

## 2020-02-25 MED ORDER — ATORVASTATIN CALCIUM 10 MG PO TABS: ORAL_TABLET | ORAL | 5 refills | Status: DC

## 2020-02-29 ENCOUNTER — Other Ambulatory Visit: Payer: Self-pay | Admitting: Internal Medicine

## 2020-02-29 ENCOUNTER — Ambulatory Visit: Payer: BC Managed Care – PPO | Admitting: Nurse Practitioner

## 2020-02-29 DIAGNOSIS — F5101 Primary insomnia: Secondary | ICD-10-CM

## 2020-03-18 ENCOUNTER — Other Ambulatory Visit: Payer: Self-pay

## 2020-03-18 ENCOUNTER — Ambulatory Visit (INDEPENDENT_AMBULATORY_CARE_PROVIDER_SITE_OTHER): Payer: Managed Care, Other (non HMO) | Admitting: Nurse Practitioner

## 2020-03-18 ENCOUNTER — Encounter: Payer: Self-pay | Admitting: Nurse Practitioner

## 2020-03-18 VITALS — BP 130/73 | HR 88 | Temp 98.3°F | Resp 16 | Ht 64.0 in | Wt 160.0 lb

## 2020-03-18 DIAGNOSIS — E1122 Type 2 diabetes mellitus with diabetic chronic kidney disease: Secondary | ICD-10-CM | POA: Diagnosis not present

## 2020-03-18 DIAGNOSIS — I10 Essential (primary) hypertension: Secondary | ICD-10-CM | POA: Diagnosis not present

## 2020-03-18 DIAGNOSIS — N182 Chronic kidney disease, stage 2 (mild): Secondary | ICD-10-CM | POA: Diagnosis not present

## 2020-03-18 DIAGNOSIS — Z794 Long term (current) use of insulin: Secondary | ICD-10-CM | POA: Diagnosis not present

## 2020-03-18 DIAGNOSIS — K5909 Other constipation: Secondary | ICD-10-CM

## 2020-03-18 DIAGNOSIS — E782 Mixed hyperlipidemia: Secondary | ICD-10-CM | POA: Diagnosis not present

## 2020-03-18 LAB — POCT GLYCOSYLATED HEMOGLOBIN (HGB A1C): Hemoglobin A1C: 8.4 % — AB (ref 4.0–5.6)

## 2020-03-18 NOTE — Progress Notes (Signed)
[facility] [place], [place] [address]  Internal MEDICINE  Office Visit Note  Patient Name: Chelsea Rivera  [id number]  [id number]  Date of Service: 03/18/2020  Chief Complaint  Patient presents with  . Follow-up  . Diabetes  . Gastroesophageal Reflux  . Hyperlipidemia  . Hypertension  . Quality Metric Gaps    flu,tetnaus,Hep C  . controlled substance form    reviewed with PT    The patient is here for follow up. Her HgbA1c is 8.4 today. She is wearing a dexcom monitor. She states that she does great in the mornings. Blood sugars are running in the 90s and 100s. Its after lunch when she really struggles. She does take a sliding scale dose of insulin for dinner, but not usually eating a great deal of food for dinner. She finds that when she does this, her sugar alarm goes off, warning her that blood sugar is in the 50s. She is only taking sliding scale insulin at night.  She is routinely seeing nephrology. She states that at one point, they were able to get her potassium level to 4.6, but went up again. She is due to see him again next week.  She is scheduled for her eye exam this Friday at noon.       Current Medication: Outpatient Encounter Medications as of 03/18/2020  Medication Sig Note  . allopurinol (ZYLOPRIM) 100 MG tablet Take by mouth. 05/14/2016: Received from: [facility]: Take 100 mg by mouth.  Marland Kitchen aspirin EC 81 MG tablet Take 81 mg by mouth daily.   Marland Kitchen atorvastatin (LIPITOR) 10 MG tablet Take 1/2 tab po  Daily for high cholesterol   . bisoprolol (ZEBETA) 5 MG tablet Take 1 tablet (5 mg total) by mouth at bedtime. (Patient taking differently: Take 1/2 tab by mouth daily.)   . conjugated estrogens (PREMARIN) vaginal cream 1 applicator vaginally twice weekly   . Continuous Blood Gluc Sensor (DEXCOM G6 SENSOR) MISC To use as directed with Dexcom sensor for continuous glucose monitoring.   . Continuous Blood Gluc  Transmit (DEXCOM G6 TRANSMITTER) MISC Use as directed for continuous glucose monitoring  - E11.65   . cyclobenzaprine (FLEXERIL) 10 MG tablet Take 1 tablet (10 mg total) by mouth at bedtime.   . Dulaglutide (TRULICITY) 1.5 HW/3.8UE SOPN INJECT 1.5MG  ONCE A WEEK   . insulin detemir (LEVEMIR FLEXTOUCH) 100 UNIT/ML FlexPen 35 units at bedtime   . insulin lispro (HUMALOG) 100 UNIT/ML injection Inject [place] BID prior to meals as directed per sliding scale. Max daily dose is 30 units. DX E11.65   . Insulin Pen Needle (BD PEN NEEDLE NANO U/F) 32G X 4 MM MISC Use as directed   . linaclotide (LINZESS) 290 MCG CAPS capsule Take 1 capsule (290 mcg total) by mouth daily before breakfast.   . metFORMIN (GLUCOPHAGE) 1000 MG tablet Take 1 tablet (1,000 mg total) by mouth 2 (two) times daily.   Marland Kitchen omeprazole (PRILOSEC) 40 MG capsule Take 1 capsule (40 mg total) by mouth daily.   . ondansetron (ZOFRAN) 8 MG tablet Take 1 tab po twice a daily as needed for nausea   . ONETOUCH ULTRA test strip TEST BLOOD SUGAR 3 TIMES DAILY AS DIRECTED   . phentermine (ADIPEX-P) 37.5 MG tablet Take 1 tablet (37.5 mg total) by mouth daily before breakfast.   . rifaximin (XIFAXAN) 550 MG TABS tablet Take 1 tablet (550 mg total) by mouth 2 (two) times daily.   Marland Kitchen  SPS 15 GM/60ML suspension TAKE 120ML BY MOUTH ONCE FOR ONE DOSE THEN AS NEEDED PER DR INSTRUCTIONS   . zolpidem (AMBIEN) 10 MG tablet Take 1 tablet (10 mg total) by mouth at bedtime as needed.   . [DISCONTINUED] torsemide (DEMADEX) 10 MG tablet Take 10 mg by mouth.   . torsemide (DEMADEX) 10 MG tablet Take 10 mg by mouth 3 (three) times a week.    No facility-administered encounter medications on file as of 03/18/2020.    Surgical History: Past Surgical History:  Procedure Laterality Date  . BREAST BIOPSY Right 2012   cleaned out fistula, not a biopsy  . CHOLECYSTECTOMY    . COLONOSCOPY WITH PROPOFOL N/A 07/07/2018   Procedure: COLONOSCOPY WITH PROPOFOL;  Surgeon: Lollie Sails, MD;  Location: [facility] ENDOSCOPY;  Service: Endoscopy;  Laterality: N/A;  . SHOULDER ARTHROSCOPY      Medical History: Past Medical History:  Diagnosis Date  . Diabetes mellitus without complication ([place])   . GERD (gastroesophageal reflux disease)   . Hyperlipidemia   . Hypertension     Family History: Family History  Problem Relation Age of Onset  . Lung cancer Mother   . Colon cancer Father   . Breast cancer Neg Hx     Social History   Socioeconomic History  . Marital status: Married    Spouse name: Not on file  . Number of children: Not on file  . Years of education: Not on file  . Highest education level: Not on file  Occupational History  . Not on file  Tobacco Use  . Smoking status: Never Smoker  . Smokeless tobacco: Never Used  Vaping Use  . Vaping Use: Never used  Substance and Sexual Activity  . Alcohol use: Yes    Comment: socially  . Drug use: No  . Sexual activity: Not on file  Other Topics Concern  . Not on file  Social History Narrative  . Not on file   Social Determinants of Health   Financial Resource Strain:   . Difficulty of Paying Living Expenses: Not on file  Food Insecurity:   . Worried About Charity fundraiser in the Last Year: Not on file  . Ran Out of Food in the Last Year: Not on file  Transportation Needs:   . Lack of Transportation (Medical): Not on file  . Lack of Transportation (Non-Medical): Not on file  Physical Activity:   . Days of Exercise per Week: Not on file  . Minutes of Exercise per Session: Not on file  Stress:   . Feeling of Stress : Not on file  Social Connections:   . Frequency of Communication with Friends and Family: Not on file  . Frequency of Social Gatherings with Friends and Family: Not on file  . Attends Religious Services: Not on file  . Active Member of Clubs or Organizations: Not on file  . Attends Archivist Meetings: Not on file  . Marital Status: Not on file  Intimate Partner  Violence:   . Fear of Current or Ex-Partner: Not on file  . Emotionally Abused: Not on file  . Physically Abused: Not on file  . Sexually Abused: Not on file      Review of Systems  Constitutional: Negative for activity change, chills, fatigue and unexpected weight change.  HENT: Negative for congestion, postnasal drip, rhinorrhea, sneezing and sore throat.   Eyes: Negative for redness.  Respiratory: Negative for cough, chest tightness, shortness of breath  and wheezing.   Cardiovascular: Negative for chest pain and palpitations.  Gastrointestinal: Negative for abdominal pain, constipation, diarrhea, nausea and vomiting.  Endocrine: Negative for cold intolerance, heat intolerance, polydipsia and polyuria.       Blood sugar dong very well in mornings and early part of the day. Running high in afternoons and evenings.   Musculoskeletal: Negative for arthralgias, back pain, joint swelling and neck pain.  Skin: Negative for rash.  Allergic/Immunologic: Negative for environmental allergies.  Neurological: Negative for dizziness, tremors, numbness and headaches.  Hematological: Negative for adenopathy. Does not bruise/bleed easily.  Psychiatric/Behavioral: Positive for sleep disturbance. Negative for behavioral problems (Depression) and suicidal ideas. The patient is not nervous/anxious.     Today's Vitals   03/18/20 1344  BP: 130/73  Pulse: 88  Resp: 16  Temp: 98.3 F (36.8 C)  SpO2: 98%  Weight: 160 lb (72.6 kg)  Height: 5\' 4"  (1.626 m)   Body mass index is 27.46 kg/m.  Physical Exam Vitals and nursing note reviewed.  Constitutional:      General: She is not in acute distress.    Appearance: Normal appearance. She is well-developed. She is not diaphoretic.  HENT:     Head: Normocephalic and atraumatic.     Mouth/Throat:     Pharynx: No oropharyngeal exudate.  Eyes:     Pupils: Pupils are equal, round, and reactive to light.  Neck:     Thyroid: No thyromegaly.      Vascular: No carotid bruit or JVD.     Trachea: No tracheal deviation.  Cardiovascular:     Rate and Rhythm: Normal rate and regular rhythm.     Heart sounds: Normal heart sounds. No murmur heard.  No friction rub. No gallop.   Pulmonary:     Effort: Pulmonary effort is normal. No respiratory distress.     Breath sounds: Normal breath sounds. No wheezing or rales.  Chest:     Chest wall: No tenderness.  Abdominal:     Palpations: Abdomen is soft.  Musculoskeletal:        General: Normal range of motion.     Cervical back: Normal range of motion and neck supple.  Lymphadenopathy:     Cervical: No cervical adenopathy.  Skin:    General: Skin is warm and dry.     Capillary Refill: Capillary refill takes less than 2 seconds.  Neurological:     General: No focal deficit present.     Mental Status: She is alert and oriented to person, place, and time.     Cranial Nerves: No cranial nerve deficit.  Psychiatric:        Mood and Affect: Mood normal.        Behavior: Behavior normal.        Thought Content: Thought content normal.        Judgment: Judgment normal.    Assessment/Plan:  1. Type 2 diabetes mellitus with stage 2 chronic kidney disease, with long-term current use of insulin (HCC) - POCT HgB A1C 8.4 today. Discussed mealtime coverage with regular insulin at lunchtime AND dinner time as needed. Continue levemir at 35 units daily and other diabetic medication as prescribed. Monitor blood sugars closely   2. Benign hypertension Stable. Continue bp medication as prescribed   3. Mixed hyperlipidemia Continue atorvastatin as prescribed   4. Chronic constipation Use linzess prescribed    General Counseling: Chelsea Rivera understanding of the findings of todays visit and agrees with plan of treatment. I have  discussed any further diagnostic evaluation that may be needed or ordered today. We also reviewed her medications today. she has been encouraged to call the office with  any questions or concerns that should arise related to todays visit.  Diabetes Counseling:  1. Addition of ACE inh/ ARB'S for nephroprotection. Microalbumin is updated  2. Diabetic foot care, prevention of complications. Podiatry consult 3. Exercise and lose weight.  4. Diabetic eye examination, Diabetic eye exam is updated  5. Monitor blood sugar closlely. nutrition counseling.  6. Sign and symptoms of hypoglycemia including shaking sweating,confusion and headaches.  This patient was seen by Leretha Pol FNP Collaboration with Dr Lavera Guise as a part of collaborative care agreement  Orders Placed This Encounter  Procedures  . POCT HgB A1C     Total time spent: 30 Minutes   Time spent includes review of chart, medications, test results, and follow up plan with the patient.      Dr Lavera Guise Internal medicine

## 2020-03-19 ENCOUNTER — Telehealth: Payer: Self-pay

## 2020-03-19 NOTE — Telephone Encounter (Signed)
[facility] received a letter that pt had not had a mammogram in 2 years. That is incorrect, pt had mammogram on 06/01/19. Christella Scheuermann did not provide any information to return proof of mammogram.

## 2020-03-21 ENCOUNTER — Other Ambulatory Visit: Payer: Self-pay

## 2020-03-21 MED ORDER — METFORMIN HCL 1000 MG PO TABS
1000.0000 mg | ORAL_TABLET | Freq: Two times a day (BID) | ORAL | 3 refills | Status: DC
Start: 2020-03-21 — End: 2020-07-22

## 2020-03-22 ENCOUNTER — Other Ambulatory Visit: Payer: Self-pay | Admitting: Internal Medicine

## 2020-03-23 ENCOUNTER — Encounter: Payer: Self-pay | Admitting: Nurse Practitioner

## 2020-03-24 ENCOUNTER — Other Ambulatory Visit: Payer: Self-pay | Admitting: Nurse Practitioner

## 2020-03-24 DIAGNOSIS — F5101 Primary insomnia: Secondary | ICD-10-CM

## 2020-03-24 MED ORDER — ZOLPIDEM TARTRATE 10 MG PO TABS: 10.0000 mg | ORAL_TABLET | Freq: Every evening | ORAL | 2 refills | Status: DC | PRN

## 2020-03-26 ENCOUNTER — Other Ambulatory Visit: Payer: Self-pay | Admitting: Nurse Practitioner

## 2020-03-26 DIAGNOSIS — R635 Abnormal weight gain: Secondary | ICD-10-CM

## 2020-03-26 MED ORDER — PHENTERMINE HCL 37.5 MG PO TABS: 37.5000 mg | ORAL_TABLET | Freq: Every day | ORAL | 2 refills | Status: DC

## 2020-04-07 ENCOUNTER — Other Ambulatory Visit: Payer: Self-pay

## 2020-04-07 DIAGNOSIS — E1165 Type 2 diabetes mellitus with hyperglycemia: Secondary | ICD-10-CM

## 2020-04-07 MED ORDER — BD PEN NEEDLE NANO U/F 32G X 4 MM MISC
1 refills | Status: DC
Start: 2020-04-07 — End: 2021-12-25

## 2020-04-07 MED ORDER — LEVEMIR FLEXTOUCH 100 UNIT/ML ~~LOC~~ SOPN: PEN_INJECTOR | SUBCUTANEOUS | 3 refills | Status: DC

## 2020-04-25 MED ORDER — TOPIRAMATE 100 MG PO TABS
100 MG | ORAL_TABLET | Freq: Two times a day (BID) | ORAL | 3 refills | Status: DC
Start: 2020-04-25 — End: 2021-02-19

## 2020-04-25 NOTE — Telephone Encounter (Signed)
Requested Prescriptions     Signed Prescriptions Disp Refills   ??? topiramate (TOPAMAX) 100 MG tablet 180 tablet 3     Sig: Take 1 tablet by mouth 2 times daily     Authorizing Provider: Verdell Carmine, [place]

## 2020-04-25 NOTE — Telephone Encounter (Signed)
Medication Name: topiramate (TOPAMAX) 100 MG tablet    Medication Dosage: 100 mg (Milligrams)    Monthly Quantity Needed: 60    How many day supply requesting: 90 days    Medication Route: Oral (PO)    Medication Administration Time: 2 times a day (BID)    Date of Last Refill: (See Medication Tab): 12/05/19    If taking medication PRN - Reason for taking medication: na    If this is a controlled substance do you receive this or any other controlled medication from any other doctor or facility? No    Ordering Physician: Verdell Carmine    Date of Last Office Visit: 01/28/20    Date of Next Office Visit: na    Updated/Validated Preferred Pharmacy: Yes    Patient instructed to contact the pharmacy prior to picking up the medication: Yes

## 2020-05-12 ENCOUNTER — Other Ambulatory Visit: Payer: Self-pay

## 2020-05-12 DIAGNOSIS — E1165 Type 2 diabetes mellitus with hyperglycemia: Secondary | ICD-10-CM

## 2020-05-12 DIAGNOSIS — Z794 Long term (current) use of insulin: Secondary | ICD-10-CM

## 2020-05-12 MED ORDER — DEXCOM G6 SENSOR MISC: 3 refills | Status: DC

## 2020-05-13 ENCOUNTER — Encounter: Payer: Self-pay | Admitting: Nurse Practitioner

## 2020-05-15 ENCOUNTER — Telehealth: Payer: Self-pay

## 2020-05-16 ENCOUNTER — Other Ambulatory Visit: Payer: Self-pay | Admitting: Nurse Practitioner

## 2020-05-16 DIAGNOSIS — J069 Acute upper respiratory infection, unspecified: Secondary | ICD-10-CM

## 2020-05-16 MED ORDER — AMOXICILLIN 875 MG PO TABS: 875.0000 mg | ORAL_TABLET | Freq: Two times a day (BID) | ORAL | 0 refills | Status: DC

## 2020-05-16 NOTE — Telephone Encounter (Signed)
Please apologize for me. I have sent both Chelsea Rivera and her husband prescriptions for amoxicillin twice daily for 10 days.  They should rest and increase fluids. Take otc medications as needed and as indicated for acute symptoms. Thanks.

## 2020-05-16 NOTE — Telephone Encounter (Signed)
Pt advised we send pres  

## 2020-05-23 ENCOUNTER — Other Ambulatory Visit: Payer: Self-pay

## 2020-05-23 DIAGNOSIS — K5909 Other constipation: Secondary | ICD-10-CM

## 2020-05-23 MED ORDER — LINACLOTIDE 290 MCG PO CAPS: 290.0000 ug | ORAL_CAPSULE | Freq: Every day | ORAL | 3 refills | Status: DC

## 2020-05-26 ENCOUNTER — Other Ambulatory Visit: Payer: Self-pay

## 2020-05-26 MED ORDER — TRULICITY 1.5 MG/0.5ML ~~LOC~~ SOAJ
SUBCUTANEOUS | 3 refills | Status: DC
Start: 2020-05-26 — End: 2020-09-15

## 2020-06-05 ENCOUNTER — Other Ambulatory Visit: Payer: Self-pay

## 2020-06-09 ENCOUNTER — Encounter: Payer: Self-pay | Admitting: Hospice and Palliative Medicine

## 2020-06-09 ENCOUNTER — Ambulatory Visit (INDEPENDENT_AMBULATORY_CARE_PROVIDER_SITE_OTHER): Payer: BC Managed Care – PPO | Admitting: Hospice and Palliative Medicine

## 2020-06-09 VITALS — BP 112/74 | HR 88 | Temp 97.4°F | Resp 16 | Ht 64.0 in | Wt 158.8 lb

## 2020-06-09 DIAGNOSIS — Z1231 Encounter for screening mammogram for malignant neoplasm of breast: Secondary | ICD-10-CM | POA: Diagnosis not present

## 2020-06-09 DIAGNOSIS — Z0001 Encounter for general adult medical examination with abnormal findings: Secondary | ICD-10-CM | POA: Diagnosis not present

## 2020-06-09 DIAGNOSIS — F5101 Primary insomnia: Secondary | ICD-10-CM | POA: Diagnosis not present

## 2020-06-09 DIAGNOSIS — Z794 Long term (current) use of insulin: Secondary | ICD-10-CM | POA: Diagnosis not present

## 2020-06-09 DIAGNOSIS — N182 Chronic kidney disease, stage 2 (mild): Secondary | ICD-10-CM

## 2020-06-09 DIAGNOSIS — E1122 Type 2 diabetes mellitus with diabetic chronic kidney disease: Secondary | ICD-10-CM

## 2020-06-09 LAB — POCT GLYCOSYLATED HEMOGLOBIN (HGB A1C): Hemoglobin A1C: 8.5 % — AB (ref 4.0–5.6)

## 2020-06-09 NOTE — Progress Notes (Signed)
[facility] [place], [place] [address]  Internal MEDICINE  Office Visit Note  Patient Name: Chelsea Rivera  [id number]  [id number]  Date of Service: 06/09/2020  Chief Complaint  Patient presents with  . Annual Exam    Discuss meds  . Diabetes  . Gastroesophageal Reflux  . Hyperlipidemia  . Hypertension  . 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 Currently wearing a continuous monitoring device--glucose levels remain uncontrolled Currently on Levemir 30 units nightly, Trulicity 1.5 mg weekly, Metformin 1,000 mg BID as well as sliding scale Typically only uses sliding scale twice daily, averages between 6-10 units each time She is concerned because it seems that anytime she eats her glucose levels never seem to recover even with the added sliding scale--post prandial average 250 Concerned about her uncontrolled glucose levels as she has a history of CKD--bounces between stage 2 and 3a  Continues to take phentermine 37.5 mg every other day to help with cravings, has been on and off phentermine since 2020 No echocardiogram on file  Continues to use Ambien for insomnia No ONO or PSG on file  PHM: Due for mammogram Colonoscopy completed February 2020-evidence of diverticulosis, repeat in 5 years recommended No BMD on file   Current Medication: Outpatient Encounter Medications as of 06/09/2020  Medication Sig Note  . allopurinol (ZYLOPRIM) 100 MG tablet Take by mouth. 05/14/2016: Received from: [place]: Take 100 mg by mouth.  Marland Kitchen aspirin EC 81 MG tablet Take 81 mg by mouth daily.   Marland Kitchen atorvastatin (LIPITOR) 10 MG tablet Take 1/2 tab po  Daily for high cholesterol   . bisoprolol (ZEBETA) 5 MG tablet TAKE 1 TABLET BY MOUTH AT BEDTIME   . conjugated estrogens (PREMARIN) vaginal cream 1  applicator vaginally twice weekly   . Continuous Blood Gluc Sensor (DEXCOM G6 SENSOR) MISC To use as directed with Dexcom sensor for continuous glucose monitoring.   . Continuous Blood Gluc Transmit (DEXCOM G6 TRANSMITTER) MISC Use as directed for continuous glucose monitoring  - E11.65   . cyclobenzaprine (FLEXERIL) 10 MG tablet Take 1 tablet (10 mg total) by mouth at bedtime.   . Dulaglutide (TRULICITY) 1.5 OE/4.2PN SOPN INJECT 1.5MG  ONCE A WEEK   . insulin detemir (LEVEMIR FLEXTOUCH) 100 UNIT/ML FlexPen 35 units at bedtime   . insulin lispro (HUMALOG) 100 UNIT/ML injection Inject [place] BID prior to meals as directed per sliding scale. Max daily dose is 30 units. DX E11.65   . Insulin Pen Needle (BD PEN NEEDLE NANO U/F) 32G X 4 MM MISC Use as directed   . linaclotide (LINZESS) 290 MCG CAPS capsule Take 1 capsule (290 mcg total) by mouth daily before breakfast.   . metFORMIN (GLUCOPHAGE) 1000 MG tablet Take 1 tablet (1,000 mg total) by mouth 2 (two) times daily.   Marland Kitchen omeprazole (PRILOSEC) 40 MG capsule Take 1 capsule (40 mg total) by mouth daily.   . ondansetron (ZOFRAN) 8 MG tablet Take 1 tab po twice a daily as needed for nausea   . ONETOUCH ULTRA test strip TEST BLOOD SUGAR 3 TIMES DAILY AS DIRECTED   . phentermine (ADIPEX-P) 37.5 MG tablet Take 1 tablet (37.5 mg total) by mouth daily before breakfast.   . SPS 15 GM/60ML suspension TAKE 120ML BY MOUTH ONCE FOR ONE DOSE THEN AS NEEDED  PER DR INSTRUCTIONS   . torsemide (DEMADEX) 10 MG tablet Take 10 mg by mouth 3 (three) times a week.   . zolpidem (AMBIEN) 10 MG tablet Take 1 tablet (10 mg total) by mouth at bedtime as needed.   . [DISCONTINUED] amoxicillin (AMOXIL) 875 MG tablet Take 1 tablet (875 mg total) by mouth 2 (two) times daily.   . [DISCONTINUED] rifaximin (XIFAXAN) 550 MG TABS tablet Take 1 tablet (550 mg total) by mouth 2 (two) times daily.    No facility-administered encounter medications on file as of 06/09/2020.    Surgical  History: Past Surgical History:  Procedure Laterality Date  . BREAST BIOPSY Right 2012   cleaned out fistula, not a biopsy  . CHOLECYSTECTOMY    . COLONOSCOPY WITH PROPOFOL N/A 07/07/2018   Procedure: COLONOSCOPY WITH PROPOFOL;  Surgeon: Lollie Sails, MD;  Location: [facility] ENDOSCOPY;  Service: Endoscopy;  Laterality: N/A;  . SHOULDER ARTHROSCOPY      Medical History: Past Medical History:  Diagnosis Date  . Diabetes mellitus without complication ([place])   . GERD (gastroesophageal reflux disease)   . Hyperlipidemia   . Hypertension     Family History: Family History  Problem Relation Age of Onset  . Lung cancer Mother   . Colon cancer Father   . Breast cancer Neg Hx       Review of Systems  Constitutional: Negative for chills, diaphoresis and fatigue.  HENT: Negative for ear pain, postnasal drip and sinus pressure.   Eyes: Negative for photophobia, discharge, redness, itching and visual disturbance.  Respiratory: Negative for cough, shortness of breath and wheezing.   Cardiovascular: Negative for chest pain, palpitations and leg swelling.  Gastrointestinal: Negative for abdominal pain, constipation, diarrhea, nausea and vomiting.  Genitourinary: Negative for dysuria and flank pain.  Musculoskeletal: Negative for arthralgias, back pain, gait problem and neck pain.  Skin: Negative for color change.  Allergic/Immunologic: Negative for environmental allergies and food allergies.  Neurological: Negative for dizziness and headaches.  Hematological: Does not bruise/bleed easily.  Psychiatric/Behavioral: Negative for agitation, behavioral problems (depression) and hallucinations.     Vital Signs: BP 112/74   Pulse 88   Temp (!) 97.4 F (36.3 C)   Resp 16   Ht 5\' 4"  (1.626 m)   Wt 158 lb 12.8 oz (72 kg)   SpO2 99%   BMI 27.26 kg/m    Physical Exam Vitals reviewed.  Constitutional:      Appearance: Normal appearance. She is normal weight.  Cardiovascular:      Rate and Rhythm: Normal rate and regular rhythm.     Pulses: Normal pulses.     Heart sounds: Normal heart sounds.  Pulmonary:     Effort: Pulmonary effort is normal.     Breath sounds: Normal breath sounds.  Abdominal:     General: Abdomen is flat.  Musculoskeletal:        General: Normal range of motion.     Cervical back: Normal range of motion.  Skin:    General: Skin is warm.  Neurological:     General: No focal deficit present.     Mental Status: She is alert and oriented to person, place, and time. Mental status is at baseline.  Psychiatric:        Mood and Affect: Mood normal.        Behavior: Behavior normal.        Thought Content: Thought content normal.        Judgment: Judgment normal.  LABS: Recent Results (from the past 2160 hour(s))  POCT HgB A1C     Status: Abnormal   Collection Time: 03/18/20  1:59 PM  Result Value Ref Range   Hemoglobin A1C 8.4 (A) 4.0 - 5.6 %   HbA1c POC (<> result, manual entry)     HbA1c, POC (prediabetic range)     HbA1c, POC (controlled diabetic range)    POCT glycosylated hemoglobin (Hb A1C)     Status: Abnormal   Collection Time: 06/09/20  9:14 AM  Result Value Ref Range   Hemoglobin A1C 8.5 (A) 4.0 - 5.6 %   HbA1c POC (<> result, manual entry)     HbA1c, POC (prediabetic range)     HbA1c, POC (controlled diabetic range)      Assessment/Plan: 1. Encounter for routine adult health examination with abnormal findings Well appearing 60 year old female Needs mammogram screening Consider BMD Lab slip given to check thyroid and lipid panel--all other routine labs reviewed from outside provider  2. Type 2 diabetes mellitus with stage 2 chronic kidney disease, with long-term current use of insulin (HCC) A1C uncontrolled at 8.5 Decrease Levemir dose to 26 units and start taking midday with lunch May add third dose of sliding scale based on morning glucose levels after breakfast Consider discontinuing Metformin in future Consider  further vascular studies//carotid US - POCT glycosylated hemoglobin (Hb A1C)  3. Encounter for screening mammogram for malignant neoplasm of breast - MM Digital Screening; Future  4. Primary insomnia Continue with Ambien//consider ONO or PSG for cause of insomnia   General Counseling: glena dehay understanding of the findings of todays visit and agrees with plan of treatment. I have discussed any further diagnostic evaluation that may be needed or ordered today. We also reviewed her medications today. she has been encouraged to call the office with any questions or concerns that should arise related to todays visit.    Counseling:    Orders Placed This Encounter  Procedures  . MM Digital Screening  . POCT glycosylated hemoglobin (Hb A1C)      Total time spent: 35 Minutes  Time spent includes review of chart, medications, test results, and follow up plan with the patient.   This patient was seen by Theodoro Grist AGNP-C Collaboration with Dr Lavera Guise as a part of collaborative care agreement   Tanna Furry. [facility] Internal Medicine

## 2020-06-10 MED ORDER — LAMOTRIGINE 25 MG PO TABS
25 MG | ORAL_TABLET | ORAL | 0 refills | Status: DC
Start: 2020-06-10 — End: 2021-02-19

## 2020-06-10 NOTE — Telephone Encounter (Signed)
Name of Caller: Kelly Miles phone number: [phone]    Relationship to Patient: patient    Provider: Verdell Carmine    Practice:  Neuro    Chief Complaint/Reason for Call: Pt called to say that the topiramate is causing her blurred vision and tingling in her lips. Her PCP suggested she may need a new medication. Please advise.    Best time of day caller can be reached: any      Patient advised that office/PCP has 24-48 business hours to return their call: Yes

## 2020-06-10 NOTE — Telephone Encounter (Signed)
Message released to patient as written.     Patient's further questions if applicable:     Were all questions from office addressed or relayed to the patient from encounter ? Yes     I emailed patient a link to MyChart and she will be getting it set up this evening. Patient requesting these instructions be sent to her via MyChart message so she can see them for herself. Please advise.

## 2020-06-10 NOTE — Telephone Encounter (Signed)
Spoke to patient and advised that a prescription for lamotrigine (Lamictal) was sent to her pharmacy. Advised patient that there are specific instructions for her to increase this medication while decreasing the topiramate that she is currently taking. Patient stated she is at the store and will return call to office when able to write the instructions down. Please advise patient the following instructions:    Continue taking topiramate as prescribed until 6 weeks after beginning the lamotrigine taper, as noted specifically below.  For 14 days: Take lamotrigine 1 tab daily and topiramate 1 tab 2 times daily  For 14 days: Take lamotrigine 1 tab 2 times daily and topiramate 1 tab 2 times daily  For 14 days: Take lamotrigine 2 tabs 2 times daily and topiramate 1 tab 2 times daily  For 14 days: Take lamotrigine 3 tabs 2 times daily and topiramate 1 tab AM & 1/2 tab PM  For 14 days: Take lamotrigine 4 tabs 2 times daily and topiramate 1 tab AM  For 14 days: Continue lamotrigine 4 tabs 2 times daily and take topiramate 1/2 tab AM  For 14 days: Continue lamotrigine 4 tabs 2 times daily and STOP topiramate

## 2020-06-10 NOTE — Telephone Encounter (Signed)
Please advise the patient she can start a slow taper up of lamotrigine. The prescription was sent to the pharmacy. After 6 weeks of taking lamotrigine, the patient can decrease the topiramate by 50 mg every 2 weeks while continuing to increase the lamtrigine.     Requested Prescriptions     Signed Prescriptions Disp Refills   ??? lamoTRIgine (LAMICTAL) 25 MG tablet 294 tablet 0     Sig: Take 1 tablet by mouth daily for 14 days, THEN 1 tablet 2 times daily for 14 days, THEN 2 tablets 2 times daily for 14 days, THEN 3 tablets 2 times daily for 14 days, THEN 4 tablets 2 times daily for 14 days.     Authorizing Provider: Gabriel Carina

## 2020-06-23 ENCOUNTER — Other Ambulatory Visit: Payer: Self-pay

## 2020-06-23 MED ORDER — OMEPRAZOLE 40 MG PO CPDR
40.0000 mg | DELAYED_RELEASE_CAPSULE | Freq: Every day | ORAL | 5 refills | Status: DC
Start: 2020-06-23 — End: 2020-12-19

## 2020-07-04 NOTE — Telephone Encounter (Signed)
Spoke with pt and let her know per provider she needs to be consistent with the 1 pill a day for 2weeks and let her body adjust to the levels because she has taking too much at one time. She needs to slowly titrate up every two weeks.

## 2020-07-04 NOTE — Telephone Encounter (Signed)
S  Patient calling [facility] with complaint of possible side effects from Lamictal and would like to stop medication    B  Prescribed on 06/10/2020, Lamictal started 06/16/2020, difficulty sleeping started on 06/16/2020, moody/crying symptoms started 06/30/2020    A  Patient states she is not getting sleep at night. She feels tired all day. She feels extremely moody and crying a lot. Muscle pain is flared up. States she always has headaches and medication is aggravating it.   She would like to stop the medication.   The first 4 days of starting medication she was taking it twice a day (not daily as prescribed)  and then started taking one pill daily.  On 06/30/2020 she started taking it 2 times a day.     R  Please review and advise patient.  Patient notified a message would be sent to provider to review. Patient instructed to call back with any questions, concerns or worsening symptoms.  Patient verbalized understanding.      Reason for Disposition  ??? Caller has NON-URGENT medicine question about med that PCP or specialist prescribed and triager unable to answer question    Protocols used: MEDICATION QUESTION CALL-ADULT-[place]

## 2020-07-04 NOTE — Telephone Encounter (Signed)
Spoke with pt and informed of message to slowly titrate. Pt says she can not sleep at night and it was worse when she was only taking one tablet. Pt wants to know if there is anything that can be done about the sleeping issue?

## 2020-07-04 NOTE — Telephone Encounter (Signed)
Please advise the patient she is taking too much medication too quickly. She needs to take one pill daily for 2 weeks before increasing to 2 tablets. The medication is prescribed as a very slow titration to avoid side effects.

## 2020-07-09 ENCOUNTER — Telehealth: Payer: Self-pay

## 2020-07-09 ENCOUNTER — Other Ambulatory Visit: Payer: Self-pay | Admitting: Internal Medicine

## 2020-07-09 DIAGNOSIS — Z1231 Encounter for screening mammogram for malignant neoplasm of breast: Secondary | ICD-10-CM

## 2020-07-09 NOTE — Telephone Encounter (Signed)
Spoke to pt about PA o Dexcom sensor and she advised that we dont have to do the PA because pt has two insurances and she files it with [place] and they pay for it.

## 2020-07-14 ENCOUNTER — Other Ambulatory Visit: Payer: Self-pay

## 2020-07-16 ENCOUNTER — Other Ambulatory Visit: Payer: Self-pay | Admitting: Hospice and Palliative Medicine

## 2020-07-16 DIAGNOSIS — F5101 Primary insomnia: Secondary | ICD-10-CM

## 2020-07-16 MED ORDER — ZOLPIDEM TARTRATE 10 MG PO TABS: 10.0000 mg | ORAL_TABLET | Freq: Every evening | ORAL | 1 refills | Status: DC | PRN

## 2020-07-20 ENCOUNTER — Other Ambulatory Visit: Payer: Self-pay | Admitting: Nurse Practitioner

## 2020-07-21 ENCOUNTER — Encounter: Payer: Self-pay | Admitting: Hospice and Palliative Medicine

## 2020-07-21 ENCOUNTER — Other Ambulatory Visit: Payer: Self-pay

## 2020-07-21 ENCOUNTER — Ambulatory Visit
Admission: RE | Admit: 2020-07-21 | Discharge: 2020-07-21 | Disposition: A | Discharge status: Home / Self Care | Payer: BC Managed Care – PPO | Source: Ambulatory Visit | Attending: Internal Medicine | Admitting: Internal Medicine

## 2020-07-21 DIAGNOSIS — Z1231 Encounter for screening mammogram for malignant neoplasm of breast: Secondary | ICD-10-CM | POA: Diagnosis present

## 2020-07-22 ENCOUNTER — Other Ambulatory Visit: Payer: Self-pay | Admitting: Internal Medicine

## 2020-07-22 NOTE — Telephone Encounter (Signed)
Please get her scheduled for a visit, thanks.

## 2020-07-28 ENCOUNTER — Other Ambulatory Visit: Payer: Self-pay

## 2020-07-28 ENCOUNTER — Ambulatory Visit (INDEPENDENT_AMBULATORY_CARE_PROVIDER_SITE_OTHER): Payer: BC Managed Care – PPO | Admitting: Physician Assistant

## 2020-07-28 ENCOUNTER — Telehealth: Payer: Self-pay

## 2020-07-28 ENCOUNTER — Encounter: Payer: Self-pay | Admitting: Physician Assistant

## 2020-07-28 DIAGNOSIS — E782 Mixed hyperlipidemia: Secondary | ICD-10-CM | POA: Diagnosis not present

## 2020-07-28 DIAGNOSIS — E1122 Type 2 diabetes mellitus with diabetic chronic kidney disease: Secondary | ICD-10-CM

## 2020-07-28 DIAGNOSIS — N182 Chronic kidney disease, stage 2 (mild): Secondary | ICD-10-CM

## 2020-07-28 DIAGNOSIS — I10 Essential (primary) hypertension: Secondary | ICD-10-CM | POA: Diagnosis not present

## 2020-07-28 DIAGNOSIS — F5101 Primary insomnia: Secondary | ICD-10-CM | POA: Diagnosis not present

## 2020-07-28 DIAGNOSIS — K5909 Other constipation: Secondary | ICD-10-CM

## 2020-07-28 DIAGNOSIS — Z794 Long term (current) use of insulin: Secondary | ICD-10-CM

## 2020-07-28 NOTE — Telephone Encounter (Signed)
Gave paperwork for omnipod to rhonda from [employer] [phone]

## 2020-07-28 NOTE — Progress Notes (Signed)
[facility] [address], [place] [address]  Internal MEDICINE  Office Visit Note  Patient Name: Chelsea Rivera  [id number]  [id number]  Date of Service: 07/29/2020  Chief Complaint  Patient presents with  . Follow-up    Omnipod   . Diabetes  . Hypertension  . Hyperlipidemia    HPI Pt is here for f/u. She switched to taking levemir in morning time bc having some lows at night when it was previously later in day. She has been doing 40units long acting and then does sliding scale at meals. She is also on trulicity weekly and 1000mg  Metformin BID. -Questions about omnipod. Rep told her that a PC can order it and the rep is who is sets the amount based on current amount of insulin. She does not want to be referred to endocrinology for further management and has brought form for omnipod. -She has a continuous monitor. She drinks a sugar free latte for breakfast, and today after 40units she is still at 188 currently. So by the time she eats lunch it jumps up.  -Takes ambien nightly for sleep. -Torsemide 3x per week to pull off potassium, followed by nephrology for this. Sees them every 6 months, next visit in May. -UTD on Mammogram, colonoscopy, and eye exam. -takes phentermine every other day, helps with hunger cravings. With increased insulin started gaining wt.  Current Medication: Outpatient Encounter Medications as of 07/28/2020  Medication Sig Note  . allopurinol (ZYLOPRIM) 100 MG tablet Take by mouth. 05/14/2016: Received from: [place]: Take 100 mg by mouth.  Marland Kitchen aspirin EC 81 MG tablet Take 81 mg by mouth daily.   Marland Kitchen atorvastatin (LIPITOR) 10 MG tablet Take 1/2 tab po  Daily for high cholesterol   . bisoprolol (ZEBETA) 5 MG tablet TAKE 1 TABLET BY MOUTH AT BEDTIME   . conjugated estrogens (PREMARIN) vaginal cream 1 applicator vaginally twice weekly   . Continuous Blood Gluc Sensor (DEXCOM G6 SENSOR) MISC To use as directed with  Dexcom sensor for continuous glucose monitoring.   . Continuous Blood Gluc Transmit (DEXCOM G6 TRANSMITTER) MISC Use as directed for continuous glucose monitoring  - E11.65   . cyclobenzaprine (FLEXERIL) 10 MG tablet Take 1 tablet (10 mg total) by mouth at bedtime.   . Dulaglutide (TRULICITY) 1.5 JQ/7.3AL SOPN INJECT 1.5MG  ONCE A WEEK   . insulin detemir (LEVEMIR FLEXTOUCH) 100 UNIT/ML FlexPen 35 units at bedtime   . insulin lispro (HUMALOG) 100 UNIT/ML injection Inject [place] BID prior to meals as directed per sliding scale. Max daily dose is 30 units. DX E11.65   . Insulin Pen Needle (BD PEN NEEDLE NANO U/F) 32G X 4 MM MISC Use as directed   . linaclotide (LINZESS) 290 MCG CAPS capsule Take 1 capsule (290 mcg total) by mouth daily before breakfast.   . metFORMIN (GLUCOPHAGE) 1000 MG tablet TAKE 1 TABLET BY MOUTH TWICE DAILY   . omeprazole (PRILOSEC) 40 MG capsule Take 1 capsule (40 mg total) by mouth daily.   . ondansetron (ZOFRAN) 8 MG tablet Take 1 tab po twice a daily as needed for nausea   . ONETOUCH ULTRA test strip TEST BLOOD SUGAR 3 TIMES DAILY AS DIRECTED   . phentermine (ADIPEX-P) 37.5 MG tablet Take 1 tablet (37.5 mg total) by mouth daily before breakfast.   . SPS 15 GM/60ML suspension TAKE 120ML BY MOUTH ONCE FOR ONE DOSE THEN AS NEEDED PER DR INSTRUCTIONS   . torsemide (DEMADEX)  10 MG tablet Take 10 mg by mouth 3 (three) times a week.   . zolpidem (AMBIEN) 10 MG tablet Take 1 tablet (10 mg total) by mouth at bedtime as needed.    No facility-administered encounter medications on file as of 07/28/2020.    Surgical History: Past Surgical History:  Procedure Laterality Date  . BREAST BIOPSY Right 2012   cleaned out fistula, not a biopsy  . CHOLECYSTECTOMY    . COLONOSCOPY WITH PROPOFOL N/A 07/07/2018   Procedure: COLONOSCOPY WITH PROPOFOL;  Surgeon: Lollie Sails, MD;  Location: [facility] ENDOSCOPY;  Service: Endoscopy;  Laterality: N/A;  . SHOULDER ARTHROSCOPY      Medical  History: Past Medical History:  Diagnosis Date  . Diabetes mellitus without complication ([place])   . GERD (gastroesophageal reflux disease)   . Hyperlipidemia   . Hypertension     Family History: Family History  Problem Relation Age of Onset  . Lung cancer Mother   . Colon cancer Father   . Breast cancer Neg Hx     Social History   Socioeconomic History  . Marital status: Married    Spouse name: Not on file  . Number of children: Not on file  . Years of education: Not on file  . Highest education level: Not on file  Occupational History  . Not on file  Tobacco Use  . Smoking status: Never Smoker  . Smokeless tobacco: Never Used  Vaping Use  . Vaping Use: Never used  Substance and Sexual Activity  . Alcohol use: Yes    Comment: socially  . Drug use: No  . Sexual activity: Not on file  Other Topics Concern  . Not on file  Social History Narrative  . Not on file   Social Determinants of Health   Financial Resource Strain: Not on file  Food Insecurity: Not on file  Transportation Needs: Not on file  Physical Activity: Not on file  Stress: Not on file  Social Connections: Not on file  Intimate Partner Violence: Not on file      Review of Systems  Constitutional: Positive for unexpected weight change. Negative for chills and fatigue.  HENT: Negative for congestion, postnasal drip, rhinorrhea, sneezing and sore throat.   Eyes: Negative for redness.  Respiratory: Negative for cough, chest tightness and shortness of breath.   Cardiovascular: Negative for chest pain and palpitations.  Gastrointestinal: Negative for abdominal pain, constipation, diarrhea, nausea and vomiting.  Genitourinary: Negative for dysuria and frequency.  Musculoskeletal: Negative for arthralgias, back pain, joint swelling and neck pain.  Skin: Negative for rash.  Neurological: Negative.  Negative for tremors and numbness.  Hematological: Negative for adenopathy. Does not bruise/bleed  easily.  Psychiatric/Behavioral: Positive for sleep disturbance. Negative for behavioral problems (Depression) and suicidal ideas. The patient is not nervous/anxious.     Vital Signs: BP 130/70   Pulse 87   Temp 97.8 F (36.6 C)   Resp 16   Ht 5\' 4"  (1.626 m)   Wt 163 lb (73.9 kg)   SpO2 98%   BMI 27.98 kg/m    Physical Exam Vitals and nursing note reviewed.  Constitutional:      General: She is not in acute distress.    Appearance: She is well-developed. She is not diaphoretic.  HENT:     Head: Normocephalic and atraumatic.     Mouth/Throat:     Pharynx: No oropharyngeal exudate.  Eyes:     Pupils: Pupils are equal, round, and reactive to  light.  Neck:     Thyroid: No thyromegaly.     Vascular: No JVD.     Trachea: No tracheal deviation.  Cardiovascular:     Rate and Rhythm: Normal rate and regular rhythm.     Heart sounds: Normal heart sounds. No murmur heard. No friction rub. No gallop.   Pulmonary:     Effort: Pulmonary effort is normal. No respiratory distress.     Breath sounds: No wheezing or rales.  Chest:     Chest wall: No tenderness.  Abdominal:     General: Bowel sounds are normal.     Palpations: Abdomen is soft.  Musculoskeletal:        General: Normal range of motion.     Cervical back: Normal range of motion and neck supple.  Lymphadenopathy:     Cervical: No cervical adenopathy.  Skin:    General: Skin is warm and dry.  Neurological:     Mental Status: She is alert and oriented to person, place, and time.     Cranial Nerves: No cranial nerve deficit.  Psychiatric:        Behavior: Behavior normal.        Thought Content: Thought content normal.        Judgment: Judgment normal.        Assessment/Plan: 1. Type 2 diabetes mellitus with stage 2 chronic kidney disease, with long-term current use of insulin (HCC) Pt has increased to 40 units levemir in the morning with sliding scale at meal times. Additionally takes trulicity weekly and  1000mg  Metformin BID. She will be started on Omnipod for further insulin management. Script filled out today and rep will establish settings based on current insulin use.  -Pt also being followed by nephrology for CKD--on torsemide 3 times per week to remove potassium.  -Pt also counseled regarding phentermine use--currently using every other day for cravings and wt loss, discussed that we would like to change to alternative such as rybelsus at next visit.   2. Primary insomnia Continue Ambien at night for sleep.  3. Benign hypertension Stable. Continue zebeta.  4. Mixed hyperlipidemia Continue lipitor daily.  5. Chronic constipation Continue linzess.   General Counseling: delle andrzejewski understanding of the findings of todays visit and agrees with plan of treatment. I have discussed any further diagnostic evaluation that may be needed or ordered today. We also reviewed her medications today. she has been encouraged to call the office with any questions or concerns that should arise related to todays visit.    No orders of the defined types were placed in this encounter.   No orders of the defined types were placed in this encounter.   This patient was seen by Drema Dallas, PA-C in collaboration with Dr. Clayborn Bigness as a part of collaborative care agreement.   Total time spent:30 Minutes Time spent includes review of chart, medications, test results, and follow up plan with the patient.      Dr Lavera Guise Internal medicine

## 2020-07-29 ENCOUNTER — Other Ambulatory Visit: Payer: Self-pay

## 2020-07-29 DIAGNOSIS — E1165 Type 2 diabetes mellitus with hyperglycemia: Secondary | ICD-10-CM

## 2020-07-29 DIAGNOSIS — Z794 Long term (current) use of insulin: Secondary | ICD-10-CM

## 2020-07-29 MED ORDER — DEXCOM G6 SENSOR MISC: 3 refills | Status: DC

## 2020-08-04 ENCOUNTER — Other Ambulatory Visit: Payer: Self-pay

## 2020-08-04 MED ORDER — METFORMIN HCL 1000 MG PO TABS: 1000.0000 mg | ORAL_TABLET | Freq: Two times a day (BID) | ORAL | 3 refills | Status: DC

## 2020-08-05 ENCOUNTER — Inpatient Hospital Stay: Admit: 2020-08-05 | Payer: MEDICAID | Attending: Surgery | Primary: Internal Medicine

## 2020-08-05 NOTE — Other (Unsigned)
Patient Acct Nbr: [id number]   Primary AUTH/CERT:   [place] Name: [place] name: [facility]  Primary Insurance Group Number: [facility]  Primary Insurance Plan Type: Health  Primary Insurance Policy Number: [id number]

## 2020-08-11 ENCOUNTER — Ambulatory Visit: Payer: Managed Care, Other (non HMO) | Admitting: Internal Medicine

## 2020-08-15 ENCOUNTER — Other Ambulatory Visit: Payer: Self-pay

## 2020-08-15 ENCOUNTER — Other Ambulatory Visit: Payer: Self-pay | Admitting: Hospice and Palliative Medicine

## 2020-08-15 DIAGNOSIS — Z794 Long term (current) use of insulin: Secondary | ICD-10-CM

## 2020-08-15 DIAGNOSIS — F5101 Primary insomnia: Secondary | ICD-10-CM

## 2020-08-15 DIAGNOSIS — E1165 Type 2 diabetes mellitus with hyperglycemia: Secondary | ICD-10-CM

## 2020-08-15 MED ORDER — ZOLPIDEM TARTRATE 10 MG PO TABS: 10.0000 mg | ORAL_TABLET | Freq: Every evening | ORAL | 1 refills | Status: DC | PRN

## 2020-08-15 MED ORDER — INSULIN ASPART 100 UNIT/ML ~~LOC~~ SOLN: SUBCUTANEOUS | 11 refills | Status: DC

## 2020-08-21 ENCOUNTER — Other Ambulatory Visit: Payer: Self-pay | Admitting: Nurse Practitioner

## 2020-08-27 ENCOUNTER — Other Ambulatory Visit: Payer: Self-pay

## 2020-09-05 ENCOUNTER — Ambulatory Visit: Payer: Managed Care, Other (non HMO) | Admitting: Hospice and Palliative Medicine

## 2020-09-09 ENCOUNTER — Other Ambulatory Visit: Payer: Self-pay

## 2020-09-09 ENCOUNTER — Ambulatory Visit (INDEPENDENT_AMBULATORY_CARE_PROVIDER_SITE_OTHER): Payer: BC Managed Care – PPO | Admitting: Hospice and Palliative Medicine

## 2020-09-09 ENCOUNTER — Encounter: Payer: Self-pay | Admitting: Hospice and Palliative Medicine

## 2020-09-09 VITALS — BP 138/82 | HR 83 | Temp 98.6°F | Resp 16 | Ht 64.0 in | Wt 163.8 lb

## 2020-09-09 DIAGNOSIS — N182 Chronic kidney disease, stage 2 (mild): Secondary | ICD-10-CM

## 2020-09-09 DIAGNOSIS — I10 Essential (primary) hypertension: Secondary | ICD-10-CM

## 2020-09-09 DIAGNOSIS — Z794 Long term (current) use of insulin: Secondary | ICD-10-CM | POA: Diagnosis not present

## 2020-09-09 DIAGNOSIS — R635 Abnormal weight gain: Secondary | ICD-10-CM

## 2020-09-09 DIAGNOSIS — E1165 Type 2 diabetes mellitus with hyperglycemia: Secondary | ICD-10-CM | POA: Diagnosis not present

## 2020-09-09 DIAGNOSIS — F5101 Primary insomnia: Secondary | ICD-10-CM

## 2020-09-09 DIAGNOSIS — E1122 Type 2 diabetes mellitus with diabetic chronic kidney disease: Secondary | ICD-10-CM

## 2020-09-09 LAB — POCT GLYCOSYLATED HEMOGLOBIN (HGB A1C): HbA1c POC (<> result, manual entry): 8.1 % (ref 4.0–5.6)

## 2020-09-09 MED ORDER — INSULIN LISPRO 100 UNIT/ML ~~LOC~~ SOLN: SUBCUTANEOUS | 5 refills | Status: DC

## 2020-09-09 MED ORDER — PHENTERMINE HCL 37.5 MG PO TABS: 37.5000 mg | ORAL_TABLET | Freq: Every day | ORAL | 1 refills | Status: DC

## 2020-09-09 MED ORDER — ZOLPIDEM TARTRATE 10 MG PO TABS: 10.0000 mg | ORAL_TABLET | Freq: Every evening | ORAL | 1 refills | Status: DC | PRN

## 2020-09-09 NOTE — Progress Notes (Signed)
[facility] [place], [place] [address]  Internal MEDICINE  Office Visit Note  Patient Name: Chelsea Rivera  [id number]  [id number]  Date of Service: 09/11/2020  Chief Complaint  Patient presents with  . Gastroesophageal Reflux  . Hypertension  . Follow-up    HPI Patient is here for routine follow-up Has recently been set up on Omnipod and has noticed a significant improvement in her glucose levels Percentage of days she has been within range has improved significantly Enjoys not having to inject herself multiple times per day with insulin  Requesting refills of phentermine she uses as needed to help with food cravings Denies negative side effects from medication such as chest pain or palpitations   Current Medication: Outpatient Encounter Medications as of 09/09/2020  Medication Sig Note  . allopurinol (ZYLOPRIM) 100 MG tablet Take by mouth. 05/14/2016: Received from: [place]: Take 100 mg by mouth.  Marland Kitchen aspirin EC 81 MG tablet Take 81 mg by mouth daily.   Marland Kitchen atorvastatin (LIPITOR) 10 MG tablet Take 1/2 tab po  Daily for high cholesterol   . bisoprolol (ZEBETA) 5 MG tablet TAKE 1 TABLET BY MOUTH AT BEDTIME   . conjugated estrogens (PREMARIN) vaginal cream 1 applicator vaginally twice weekly   . Continuous Blood Gluc Sensor (DEXCOM G6 SENSOR) MISC To use as directed with Dexcom sensor for continuous glucose monitoring.   . Continuous Blood Gluc Transmit (DEXCOM G6 TRANSMITTER) MISC Use as directed for continuous glucose monitoring  - E11.65   . cyclobenzaprine (FLEXERIL) 10 MG tablet Take 1 tablet (10 mg total) by mouth at bedtime.   . Dulaglutide (TRULICITY) 1.5 MV/7.8IO SOPN INJECT 1.5MG  ONCE A WEEK   . insulin aspart (NOVOLOG) 100 UNIT/ML injection Inject [place] BID prior to meals as directed per sliding scale. Max daily dose is 30 units. DX E11.65   . insulin detemir (LEVEMIR FLEXTOUCH) 100 UNIT/ML FlexPen 35 units at bedtime    . insulin lispro (HUMALOG) 100 UNIT/ML injection Inject [place] BID prior to meals as directed per sliding scale. Max daily dose is 30 units. DX E11.65   . Insulin Pen Needle (BD PEN NEEDLE NANO U/F) 32G X 4 MM MISC Use as directed   . linaclotide (LINZESS) 290 MCG CAPS capsule Take 1 capsule (290 mcg total) by mouth daily before breakfast.   . metFORMIN (GLUCOPHAGE) 1000 MG tablet Take 1 tablet (1,000 mg total) by mouth 2 (two) times daily.   Marland Kitchen omeprazole (PRILOSEC) 40 MG capsule Take 1 capsule (40 mg total) by mouth daily.   . ondansetron (ZOFRAN) 8 MG tablet Take 1 tab po twice a daily as needed for nausea   . ONETOUCH ULTRA test strip TEST BLOOD SUGAR 3 TIMES DAILY AS DIRECTED   . phentermine (ADIPEX-P) 37.5 MG tablet Take 1 tablet (37.5 mg total) by mouth daily before breakfast.   . SPS 15 GM/60ML suspension TAKE 120ML BY MOUTH ONCE FOR ONE DOSE THEN AS NEEDED PER DR INSTRUCTIONS   . torsemide (DEMADEX) 10 MG tablet Take 10 mg by mouth 3 (three) times a week.   . zolpidem (AMBIEN) 10 MG tablet Take 1 tablet (10 mg total) by mouth at bedtime as needed.   . [DISCONTINUED] insulin lispro (HUMALOG) 100 UNIT/ML injection Inject [place] BID prior to meals as directed per sliding scale. Max daily dose is 30 units. DX E11.65   . [DISCONTINUED] phentermine (ADIPEX-P) 37.5 MG tablet Take 1 tablet (37.5 mg total) by mouth  daily before breakfast.   . [DISCONTINUED] zolpidem (AMBIEN) 10 MG tablet Take 1 tablet (10 mg total) by mouth at bedtime as needed.    No facility-administered encounter medications on file as of 09/09/2020.    Surgical History: Past Surgical History:  Procedure Laterality Date  . BREAST BIOPSY Right 2012   cleaned out fistula, not a biopsy  . CHOLECYSTECTOMY    . COLONOSCOPY WITH PROPOFOL N/A 07/07/2018   Procedure: COLONOSCOPY WITH PROPOFOL;  Surgeon: Lollie Sails, MD;  [facility] END[facility]COPY;  Service: Endoscopy;  Laterality: N/A;  . SHOULDER ARTHROSCOPY      Medical  History: Past Medical History:  Diagnosis Date  . Diabetes mellitus without complication ([place])   . GERD (gastroesophageal reflux disease)   . Hyperlipidemia   . Hypertension     Family History: Family History  Problem Relation Age of Onset  . Lung cancer Mother   . Colon cancer Father   . Breast cancer Neg Hx     Social History   Socioeconomic History  . Marital status: Married    Spouse name: Not on file  . Number of children: Not on file  . Years of education: Not on file  . Highest education level: Not on file  Occupational History  . Not on file  Tobacco Use  . Smoking status: Never Smoker  . Smokeless tobacco: Never Used  Vaping Use  . Vaping Use: Never used  Substance and Sexual Activity  . Alcohol use: Yes    Comment: socially  . Drug use: No  . Sexual activity: Not on file  Other Topics Concern  . Not on file  Social History Narrative  . Not on file   Social Determinants of Health   Financial Resource Strain: Not on file  Food Insecurity: Not on file  Transportation Needs: Not on file  Physical Activity: Not on file  Stress: Not on file  Social Connections: Not on file  Intimate Partner Violence: Not on file      Review of Systems  Constitutional: Negative for chills, diaphoresis and fatigue.  HENT: Negative for ear pain, postnasal drip and sinus pressure.   Eyes: Negative for photophobia, discharge, redness, itching and visual disturbance.  Respiratory: Negative for cough, shortness of breath and wheezing.   Cardiovascular: Negative for chest pain, palpitations and leg swelling.  Gastrointestinal: Negative for abdominal pain, constipation, diarrhea, nausea and vomiting.  Genitourinary: Negative for dysuria and flank pain.  Musculoskeletal: Negative for arthralgias, back pain, gait problem and neck pain.  Skin: Negative for color change.  Allergic/Immunologic: Negative for environmental allergies and food allergies.  Neurological: Negative for  dizziness and headaches.  Hematological: Does not bruise/bleed easily.  Psychiatric/Behavioral: Negative for agitation, behavioral problems (depression) and hallucinations.    Vital Signs: BP 138/82   Pulse 83   Temp 98.6 F (37 C)   Resp 16   Ht 5\' 4"  (1.626 m)   Wt 163 lb 12.8 oz (74.3 kg)   SpO2 95%   BMI 28.12 kg/m    Physical Exam Vitals reviewed.  Constitutional:      Appearance: Normal appearance. She is normal weight.  Cardiovascular:     Rate and Rhythm: Normal rate and regular rhythm.     Pulses: Normal pulses.     Heart sounds: Normal heart sounds.  Pulmonary:     Effort: Pulmonary effort is normal.     Breath sounds: Normal breath sounds.  Abdominal:     General: Abdomen is flat.  Palpations: Abdomen is soft.  Musculoskeletal:        General: Normal range of motion.     Cervical back: Normal range of motion.  Skin:    General: Skin is warm.  Neurological:     General: No focal deficit present.     Mental Status: She is alert and oriented to person, place, and time. Mental status is at baseline.  Psychiatric:        Mood and Affect: Mood normal.        Behavior: Behavior normal.        Thought Content: Thought content normal.        Judgment: Judgment normal.    Assessment/Plan: 1. Type 2 diabetes mellitus with hyperglycemia, with long-term current use of insulin (HCC) A1C improved to 8.1--continue with current dosing with Omnipod Requesting refills - POCT HgB A1C - insulin lispro (HUMALOG) 100 UNIT/ML injection; Inject [place] BID prior to meals as directed per sliding scale. Max daily dose is 30 units. DX E11.65  Dispense: 20 mL; Refill: 5  2. Primary insomnia Remains well controlled with Ambien as needed Requesting refills [place] Controlled Substance Database was reviewed by me for overdose risk score (ORS) Reviewed risks and possible side effects associated with taking opiates, benzodiazepines and other CNS depressants. Combination of these could  cause dizziness and drowsiness. Advised patient not to drive or operate machinery when taking these medications, as patient's and other's life can be at risk and will have consequences. Patient verbalized understanding in this matter. Dependence and abuse for these drugs will be monitored closely. A Controlled substance policy and procedure is on file which allows [facility] to order a urine drug screen test at any visit. Patient understands and agrees with the plan - zolpidem (AMBIEN) 10 MG tablet; Take 1 tablet (10 mg total) by mouth at bedtime as needed.  Dispense: 30 tablet; Refill: 1  3. Abnormal weight gain May continue with Phentermine sparingly to help with food cravings - phentermine (ADIPEX-P) 37.5 MG tablet; Take 1 tablet (37.5 mg total) by mouth daily before breakfast.  Dispense: 30 tablet; Refill: 1  4. Benign hypertension BP and HR remain well controlled, continue to monitor  General Counseling: rondalyn belford understanding of the findings of todays visit and agrees with plan of treatment. I have discussed any further diagnostic evaluation that may be needed or ordered today. We also reviewed her medications today. she has been encouraged to call the office with any questions or concerns that should arise related to todays visit.    Orders Placed This Encounter  Procedures  . POCT HgB A1C    Meds ordered this encounter  Medications  . insulin lispro (HUMALOG) 100 UNIT/ML injection    Sig: Inject [place] BID prior to meals as directed per sliding scale. Max daily dose is 30 units. DX E11.65    Dispense:  20 mL    Refill:  5    Please substitute as her insurance prefers  . zolpidem (AMBIEN) 10 MG tablet    Sig: Take 1 tablet (10 mg total) by mouth at bedtime as needed.    Dispense:  30 tablet    Refill:  1  . phentermine (ADIPEX-P) 37.5 MG tablet    Sig: Take 1 tablet (37.5 mg total) by mouth daily before breakfast.    Dispense:  30 tablet    Refill:  1     Time spent: 30 Minutes Time spent includes review of chart, medications, test results and  follow-up plan with the patient.  This patient was seen by Theodoro Grist AGNP-C in Collaboration with Dr Lavera Guise as a part of collaborative care agreement     Tanna Furry. Amayrani Bennick AGNP-C Internal medicine

## 2020-09-11 ENCOUNTER — Encounter: Payer: Self-pay | Admitting: Hospice and Palliative Medicine

## 2020-09-15 ENCOUNTER — Other Ambulatory Visit: Payer: Self-pay | Admitting: Internal Medicine

## 2020-09-20 ENCOUNTER — Other Ambulatory Visit: Payer: Self-pay | Admitting: Internal Medicine

## 2020-09-20 DIAGNOSIS — K5909 Other constipation: Secondary | ICD-10-CM

## 2020-09-25 ENCOUNTER — Telehealth: Payer: Self-pay

## 2020-09-25 NOTE — Telephone Encounter (Signed)
Pt called and I asked about PA for Humalog and she advised she didn't need PA done at this time but informed me that her insurance will be changing soon so I advised for pt to let me know when so if PA's need to be done we can get them started once her insurance changes and requests them.  Also gave pt information on the Dexcom to who may be covered thru insurance.

## 2020-11-14 ENCOUNTER — Telehealth: Payer: Self-pay

## 2020-11-14 DIAGNOSIS — Z794 Long term (current) use of insulin: Secondary | ICD-10-CM

## 2020-11-14 DIAGNOSIS — E1165 Type 2 diabetes mellitus with hyperglycemia: Secondary | ICD-10-CM

## 2020-11-14 MED ORDER — DEXCOM G6 TRANSMITTER MISC: 3 refills | Status: DC

## 2020-11-14 MED ORDER — DEXCOM G6 RECEIVER DEVI: 3 refills | Status: DC

## 2020-11-14 MED ORDER — DEXCOM G6 SENSOR MISC: 3 refills | Status: DC

## 2020-11-14 NOTE — Telephone Encounter (Signed)
PA for Dexcom G6 Sensor was approved on 11/14/20, new prescriptions sent to pharmacy

## 2020-11-21 ENCOUNTER — Other Ambulatory Visit: Payer: Self-pay

## 2020-11-21 ENCOUNTER — Encounter: Payer: Self-pay | Admitting: Internal Medicine

## 2020-11-21 DIAGNOSIS — Z794 Long term (current) use of insulin: Secondary | ICD-10-CM

## 2020-11-21 DIAGNOSIS — E1165 Type 2 diabetes mellitus with hyperglycemia: Secondary | ICD-10-CM

## 2020-11-21 MED ORDER — INSULIN ASPART 100 UNIT/ML IJ SOLN: INTRAMUSCULAR | 3 refills | Status: DC

## 2020-12-08 ENCOUNTER — Telehealth: Payer: Self-pay

## 2020-12-08 NOTE — Telephone Encounter (Signed)
Left vm to screen for 12/09/20 appointment-Toni

## 2020-12-09 ENCOUNTER — Ambulatory Visit (INDEPENDENT_AMBULATORY_CARE_PROVIDER_SITE_OTHER): Payer: BC Managed Care – PPO | Admitting: Internal Medicine

## 2020-12-09 ENCOUNTER — Encounter: Payer: Self-pay | Admitting: Internal Medicine

## 2020-12-09 ENCOUNTER — Other Ambulatory Visit: Payer: Self-pay

## 2020-12-09 VITALS — BP 123/70 | HR 85 | Temp 97.8°F | Resp 16 | Ht 64.0 in | Wt 165.0 lb

## 2020-12-09 DIAGNOSIS — D638 Anemia in other chronic diseases classified elsewhere: Secondary | ICD-10-CM | POA: Diagnosis not present

## 2020-12-09 DIAGNOSIS — Z794 Long term (current) use of insulin: Secondary | ICD-10-CM

## 2020-12-09 DIAGNOSIS — R55 Syncope and collapse: Secondary | ICD-10-CM

## 2020-12-09 DIAGNOSIS — E875 Hyperkalemia: Secondary | ICD-10-CM

## 2020-12-09 DIAGNOSIS — E1165 Type 2 diabetes mellitus with hyperglycemia: Secondary | ICD-10-CM

## 2020-12-09 DIAGNOSIS — Z23 Encounter for immunization: Secondary | ICD-10-CM | POA: Diagnosis not present

## 2020-12-09 DIAGNOSIS — Z1231 Encounter for screening mammogram for malignant neoplasm of breast: Secondary | ICD-10-CM

## 2020-12-09 DIAGNOSIS — Z79899 Other long term (current) drug therapy: Secondary | ICD-10-CM

## 2020-12-09 LAB — POCT GLYCOSYLATED HEMOGLOBIN (HGB A1C): Hemoglobin A1C: 6.6 % — AB (ref 4.0–5.6)

## 2020-12-09 LAB — POCT UA - MICROALBUMIN
Creatinine, POC: 300 mg/dL
Microalbumin Ur, POC: 80 mg/L

## 2020-12-09 MED ORDER — PREVNAR 20 0.5 ML IM SUSY: 0.5000 mL | PREFILLED_SYRINGE | INTRAMUSCULAR | 0 refills | Status: AC

## 2020-12-09 NOTE — Progress Notes (Signed)
[facility] [place], [place] [address]  Internal MEDICINE  Office Visit Note  Patient Name: Chelsea Rivera  [id number]  [id number]  Date of Service: 12/09/2020  Chief Complaint  Patient presents with   Follow-up   Diabetes   Hyperlipidemia   Hypertension    HPI  Pt is here for routine follow up She is a long term type I diabetic. Was started on Omnipod 3 months ago for better diabetic control. Overall feels well. Better diabetic control, using Dexcom as well. Did have few episodes of overnight hypoglycemia, planning to lower basal rate. Her hg a1c is improved from 8.5 to 6.6 today. She feels very comfortable using both devices  Patient had history of tachycardia and has been taking bisoprolol low-dose 2.5 mg once a day her heart rate is well controlled. 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 Her other concern today is episodes of passing out few times first thing in the morning when she is trying to get out of bed.  She has checked her blood sugar and this was in normal range and did not happen due to hypoglycemia. She thinks might be due to low blood pressure.  She has not had any cardiac work-up in the past Noticed that her hemoglobin was low couple of months ago blood work done by nephrology   Current Medication: Outpatient Encounter Medications as of 12/09/2020  Medication Sig Note   allopurinol (ZYLOPRIM) 100 MG tablet Take by mouth. 05/14/2016: Received from: [place]: Take 100 mg by mouth.   aspirin EC 81 MG tablet Take 81 mg by mouth daily.    atorvastatin (LIPITOR) 10 MG tablet TAKE 1/2 TABLET EVERYDAY FOR HIGH CHOLESTEROL    bisoprolol (ZEBETA) 5 MG tablet TAKE 1 TABLET BY MOUTH AT BEDTIME    conjugated estrogens (PREMARIN) vaginal cream 1 applicator  vaginally twice weekly    Continuous Blood Gluc Receiver (DEXCOM G6 RECEIVER) DEVI Use as directed for continuous glucose monitoring - E11.65    Continuous Blood Gluc Sensor (DEXCOM G6 SENSOR) MISC To use as directed with Dexcom sensor for continuous glucose monitoring.    Continuous Blood Gluc Transmit (DEXCOM G6 TRANSMITTER) MISC Use as directed for continuous glucose monitoring  - E11.65    cyclobenzaprine (FLEXERIL) 10 MG tablet Take 1 tablet (10 mg total) by mouth at bedtime.    Dulaglutide (TRULICITY) 1.5 JG/8.1LX SOPN INJECT 1.5 MG ONCE EVERY 7 DAYS    insulin aspart (NOVOLOG) 100 UNIT/ML injection Use as  directed with insulin pump with omnipod  and sliding scale maximum 50 units a day    insulin detemir (LEVEMIR FLEXTOUCH) 100 UNIT/ML FlexPen 35 units at bedtime    insulin lispro (HUMALOG) 100 UNIT/ML injection Inject [place] BID prior to meals as directed per sliding scale. Max daily dose is 30 units. DX E11.65    Insulin Pen Needle (BD PEN NEEDLE NANO U/F) 32G X 4 MM MISC Use as directed    LINZESS 290 MCG CAPS capsule TAKE 1 CAPSULE BY MOUTH EVERY DAY BEFOREBREAKFAST    metFORMIN (GLUCOPHAGE) 1000 MG tablet Take 1 tablet (1,000 mg total) by mouth 2 (two) times daily.    omeprazole (PRILOSEC) 40 MG capsule Take 1 capsule (40 mg total) by mouth daily.    ondansetron (ZOFRAN) 8 MG tablet Take  1 tab po twice a daily as needed for nausea    ONETOUCH ULTRA test strip TEST BLOOD SUGAR 3 TIMES DAILY AS DIRECTED    phentermine (ADIPEX-P) 37.5 MG tablet Take 1 tablet (37.5 mg total) by mouth daily before breakfast.    pneumococcal 20-Val Conj Vacc (PREVNAR 20) 0.5 ML injection Inject 0.5 mLs into the muscle tomorrow at 10 am.    SPS 15 GM/60ML suspension TAKE 120ML BY MOUTH ONCE FOR ONE DOSE THEN AS NEEDED PER DR INSTRUCTIONS    torsemide (DEMADEX) 10 MG tablet Take 10 mg by mouth 3 (three) times a week.    zolpidem (AMBIEN) 10 MG tablet Take 1 tablet (10 mg total) by mouth at bedtime as needed.     No facility-administered encounter medications on file as of 12/09/2020.    Surgical History: Past Surgical History:  Procedure Laterality Date   BREAST BIOPSY Right 2012   cleaned out fistula, not a biopsy   CHOLECYSTECTOMY     COLONOSCOPY WITH PROPOFOL N/A 07/07/2018   Procedure: COLONOSCOPY WITH PROPOFOL;  Surgeon: Lollie Sails, MD;  Location[facility] ENDOSCOPY;  Service: Endoscopy;  Laterality: N/A;   SHOULDER ARTHROSCOPY      Medical History: Past Medical History:  Diagnosis Date   Diabetes mellitus without complication (HCC)    GERD (gastroesophageal reflux disease)    Hyperlipidemia    Hypertension     Family History: Family History  Problem Relation Age of Onset   Lung cancer Mother    Colon cancer Father    Breast cancer Neg Hx     Social History   Socioeconomic History   Marital status: Married    Spouse name: Not on file   Number of children: Not on file   Years of education: Not on file   Highest education level: Not on file  Occupational History   Not on file  Tobacco Use   Smoking status: Never   Smokeless tobacco: Never  Vaping Use   Vaping Use: Never used  Substance and Sexual Activity   Alcohol use: Yes    Comment: socially   Drug use: No   Sexual activity: Not on file  Other Topics Concern   Not on file  Social History Narrative   Not on file   Social Determinants of Health   Financial Resource Strain: Not on file  Food Insecurity: Not on file  Transportation Needs: Not on file  Physical Activity: Not on file  Stress: Not on file  Social Connections: Not on file  Intimate Partner Violence: Not on file      Review of Systems  Constitutional:  Negative for chills, fatigue and unexpected weight change.  HENT:  Positive for postnasal drip. Negative for congestion, rhinorrhea, sneezing and sore throat.   Eyes:  Negative for redness.  Respiratory:  Negative for cough, chest tightness and shortness of breath.   Cardiovascular:   Negative for chest pain and palpitations.  Gastrointestinal:  Negative for abdominal pain, constipation, diarrhea, nausea and vomiting.  Genitourinary:  Negative for dysuria and frequency.  Musculoskeletal:  Negative for arthralgias, back pain, joint swelling and neck pain.  Skin:  Negative for rash.  Neurological:  Positive for syncope. Negative for tremors and numbness.  Hematological:  Negative for adenopathy. Does not bruise/bleed easily.  Psychiatric/Behavioral:  Negative for behavioral problems (Depression), sleep disturbance and suicidal ideas. The patient is not nervous/anxious.    Vital Signs: BP 123/70   Pulse 85   Temp 97.8 F (36.6  C)   Resp 16   Ht _0  (1.626 m)   Wt 165 lb (74.8 kg)   SpO2 99%   BMI 28.32 kg/m    Physical Exam Constitutional:      Appearance: Normal appearance.  HENT:     Head: Normocephalic and atraumatic.     Nose: Nose normal.     Mouth/Throat:     Mouth: Mucous membranes are moist.     Pharynx: No posterior oropharyngeal erythema.  Eyes:     Extraocular Movements: Extraocular movements intact.     Pupils: Pupils are equal, round, and reactive to light.  Cardiovascular:     Pulses: Normal pulses.     Heart sounds: Murmur heard.  Pulmonary:     Effort: Pulmonary effort is normal.     Breath sounds: Rhonchi present.  Musculoskeletal:        General: Normal range of motion.  Neurological:     General: No focal deficit present.     Mental Status: She is alert. Mental status is at baseline.     Motor: No weakness.     Gait: Gait normal.  Psychiatric:        Mood and Affect: Mood normal.        Behavior: Behavior normal.       Assessment/Plan: 1. Type 2 diabetes mellitus with hyperglycemia, with long-term current use of insulin ([place]) Patient is to continue and monitor insulin therapy through OmniPod and continue to monitor her blood sugar through continues glucose monitoring (Dexcom ) hemoglobin A1c is improved drastically from  8.5-6.6.  She will reduce the basal rate done due to few episodes of hypoglycemia - POCT HgB A1C - POCT UA - Microalbumin   2. Need for pneumococcal vaccine Pneumococcal 20 conjugate vaccine is sent to the pharmacy  3. Syncope and collapse Patient has few episodes of syncope and collapse and very concerned about this.  Multifactorial, might be associated with hypovolemia related to Demadex or hypertension we will hold both bisoprolol and Demadex at this time repeat labs patient does have elevated BUN and mildly elevated creatinine we will also get her EGFR Patient will need syncopal work-up echo, and carotid Dopplers we will call her in few days to get an update on her symptoms, might also need a Holter monitor however the frequency of episodes is not high  4. Anemia in other chronic diseases classified elsewhere Her hemoglobin was stable couple of months ago we will get iron studies Z61 folic acid - CBC with Differential/Platelet; Future - B12 and Folate Panel  5. Hyperkalemia Ongoing hyperkalemia patient has been on Demadex however might need alternate therapy like Lokelma  6. On statin therapy due to risk of future cardiovascular event Continue on Lipitor 10 mg once a day  General Counseling: bettejane leavens understanding of the findings of todays visit and agrees with plan of treatment. I have discussed any further diagnostic evaluation that may be needed or ordered today. We also reviewed her medications today. she has been encouraged to call the office with any questions or concerns that should arise related to todays visit.    Orders Placed This Encounter  Procedures   POCT HgB A1C   POCT UA - Microalbumin    No orders of the defined types were placed in this encounter.   Total time spent:30 Minutes Time spent includes review of chart, medications, test results, and follow up plan with the patient.   [place] Controlled Substance Database was reviewed by me.  Dr Lavera Guise Internal medicine

## 2020-12-11 IMAGING — MG MM DIGITAL DIAGNOSTIC UNILAT*L* W/ TOMO W/ CAD
6 series · 6 of 14 positions shown · non-contrast
Comparison: [DATE] a.m.

CLINICAL DATA: Patient returns after screening study for evaluation
of possible LEFT breast asymmetry.

EXAM:
DIGITAL DIAGNOSTIC UNILATERAL LEFT MAMMOGRAM WITH CAD AND TOMO

[L MLO synth-2D]
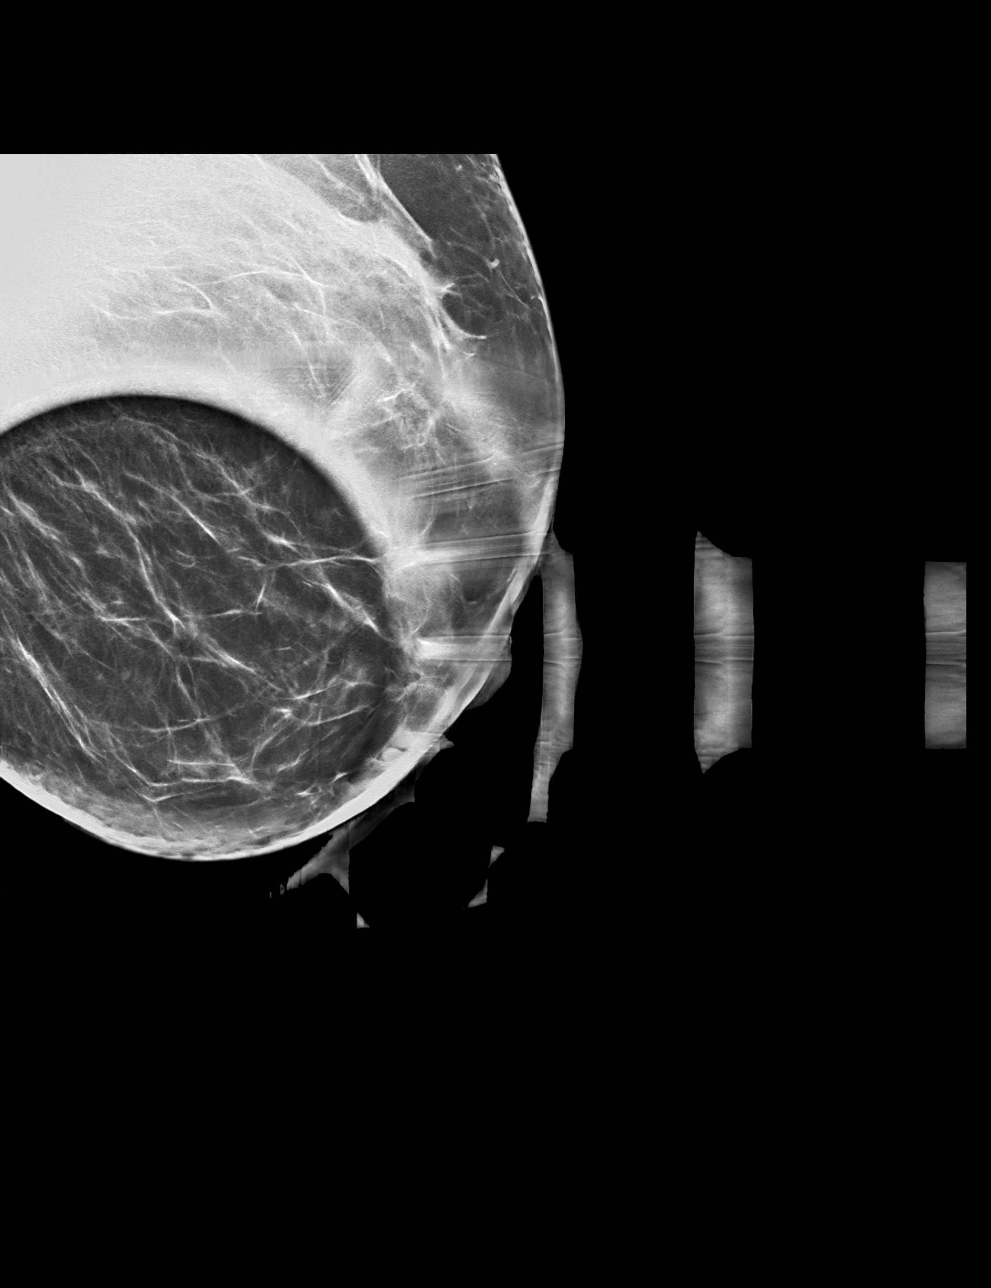

[L ML synth-2D]
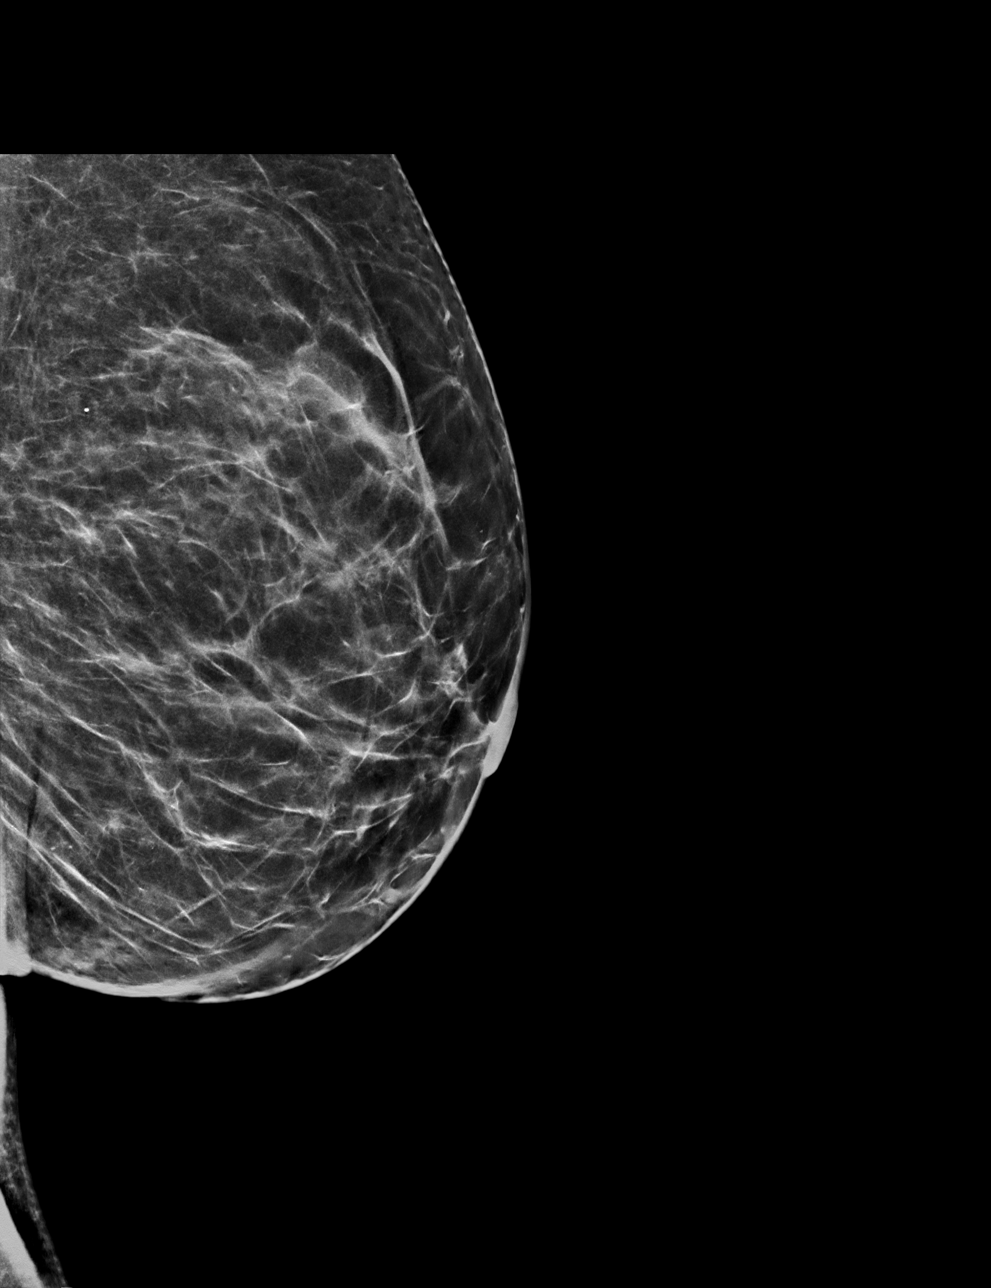

[L ML tomo (1 of 2)]
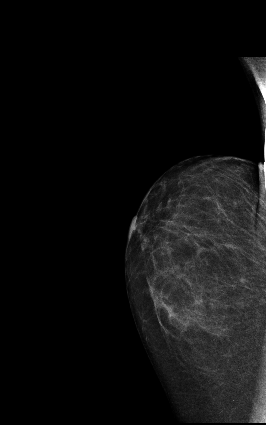

[L MLO tomo (1 of 2)]
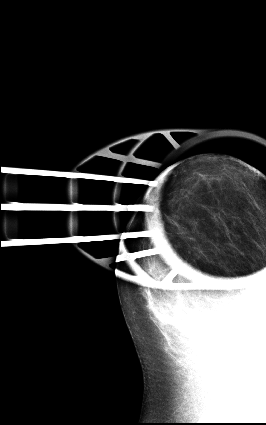

[L MLO tomo (2 of 2) · tomo slice 31/62.0]
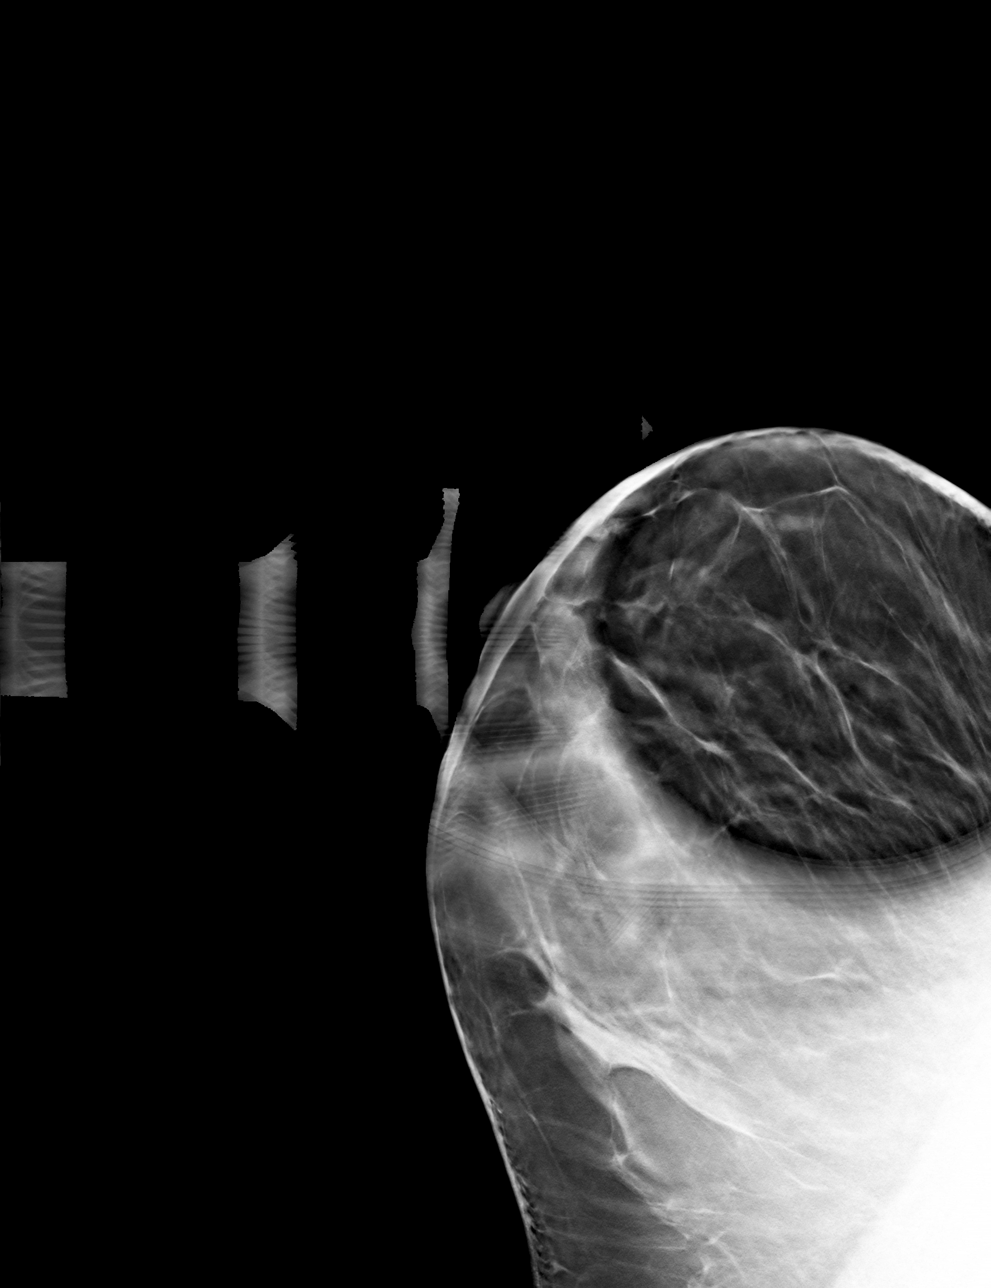

[L ML tomo (2 of 2) · tomo slice 35/68.0]
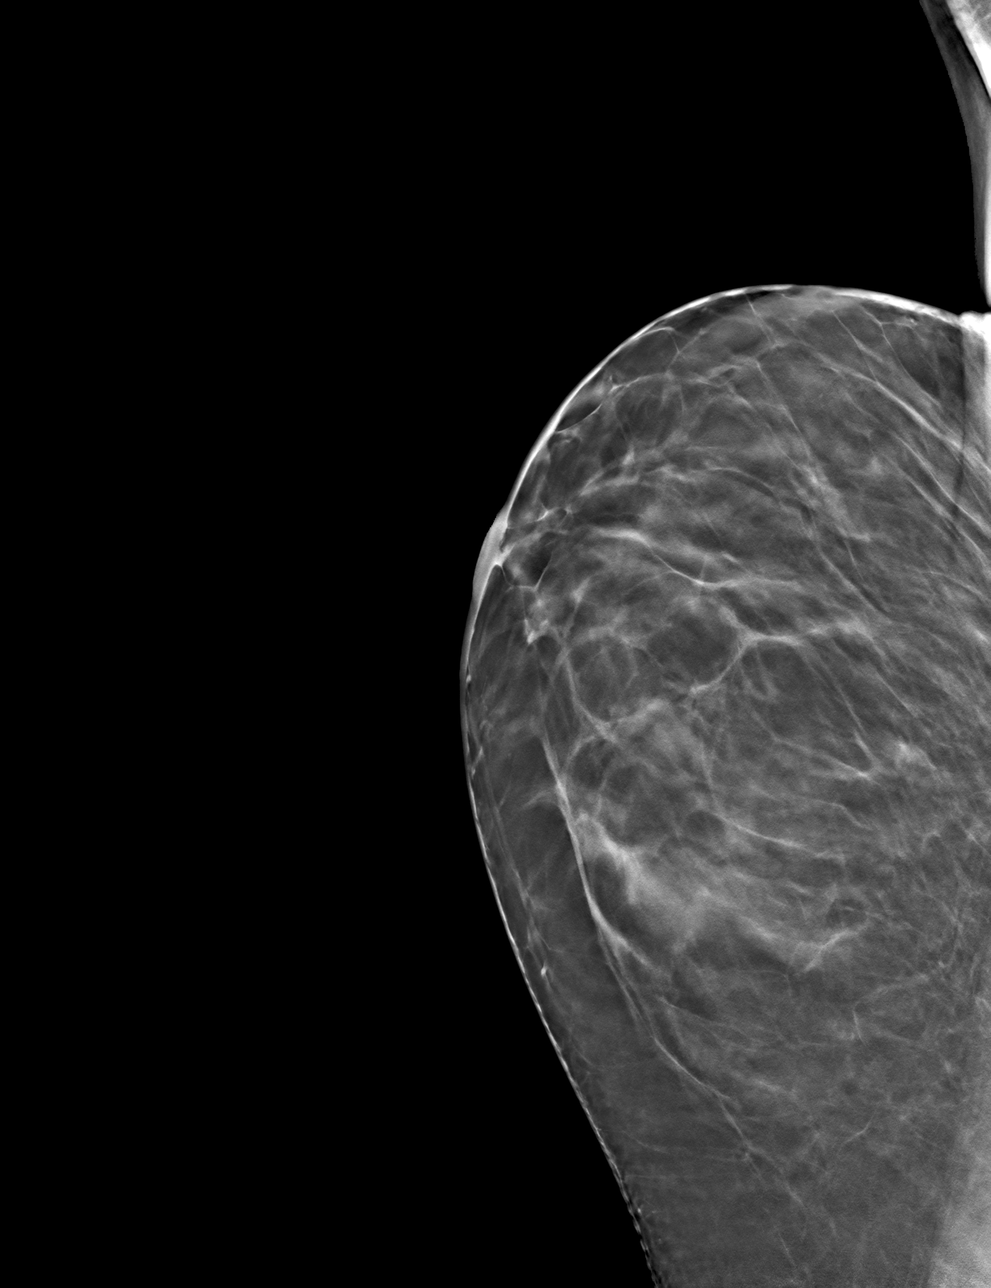

[6 of 14 positions shown; findings below may reference images not displayed]

ACR Breast Density Category c: The breast tissue is heterogeneously
dense, which may obscure small masses.
FINDINGS: Additional 2-D and 3-D images are performed. These views showed no
persistent mass or asymmetry in the LOWER portion the LEFT breast.
No suspicious mass, distortion, or microcalcifications are
identified to suggest presence of malignancy.

Mammographic images were processed with CAD.
IMPRESSION: No mammographic evidence for malignancy.

RECOMMENDATION:
Screening mammogram in one year.(Code:PR-2-M3D)

I have discussed the findings and recommendations with the patient.
If applicable, a reminder letter will be sent to the patient
regarding the next appointment.

BI-RADS CATEGORY  1: Negative.

## 2020-12-12 ENCOUNTER — Encounter: Payer: Self-pay | Admitting: Internal Medicine

## 2020-12-13 ENCOUNTER — Encounter: Payer: Self-pay | Admitting: Internal Medicine

## 2020-12-19 ENCOUNTER — Ambulatory Visit: Payer: BC Managed Care – PPO | Attending: Internal Medicine

## 2020-12-19 ENCOUNTER — Other Ambulatory Visit: Payer: Self-pay | Admitting: Internal Medicine

## 2020-12-19 ENCOUNTER — Other Ambulatory Visit: Payer: Self-pay

## 2020-12-19 DIAGNOSIS — Z23 Encounter for immunization: Secondary | ICD-10-CM

## 2020-12-19 MED ORDER — PFIZER-BIONT COVID-19 VAC-TRIS 30 MCG/0.3ML IM SUSP
INTRAMUSCULAR | 0 refills | Status: DC
  Filled 2020-12-19: qty 0.3, 1d supply, fill #0

## 2020-12-19 NOTE — Progress Notes (Signed)
   Covid-19 Vaccination Clinic  Name:  ZEMIRA DELAWDER    MRN: [id number] DOB: 29-Aug-1960  12/19/2020  Ms. Cassarino was observed post Covid-19 immunization for 15 minutes without incident. She was provided with Vaccine Information Sheet and instruction to access the V-Safe system.   Ms. Belyea was instructed to call 911 with any severe reactions post vaccine: Difficulty breathing  Swelling of face and throat  A fast heartbeat  A bad rash all over body  Dizziness and weakness   Immunizations Administered     Name Date Dose VIS Date Route   PFIZER Comrnaty(Gray TOP) Covid-19 Vaccine 12/19/2020 12:15 PM 0.3 mL 04/17/2020 Intramuscular   Manufacturer: [employer]   Lot: [id number]   [place]: [phone]       Covid-19 Vaccination Clinic  Name:  AVIANAH STAHR    MRN: [id number] DOB: 09/08/1960  12/19/2020  Ms. Rana was observed post Covid-19 immunization for 15 minutes without incident. She was provided with Vaccine Information Sheet and instruction to access the V-Safe system.   Ms. Nuttle was instructed to call 911 with any severe reactions post vaccine: Difficulty breathing  Swelling of face and throat  A fast heartbeat  A bad rash all over body  Dizziness and weakness   Immunizations Administered     Name Date Dose VIS Date Route   PFIZER Comrnaty(Gray TOP) Covid-19 Vaccine 12/19/2020 12:15 PM 0.3 mL 04/17/2020 Intramuscular   Manufacturer: [employer]   Lot: [id number]   [place]: [phone]

## 2020-12-21 IMAGING — US US RENAL
1 series · 14 of 25 positions shown · non-contrast
Comparison: Ultrasound 10/26/2011.

CLINICAL DATA: Chronic renal disease.

EXAM:
RENAL / URINARY TRACT ULTRASOUND COMPLETE

[Series 1: us renal · 0.20mm/px · 14 of 42 slices shown]
[im 1/42]
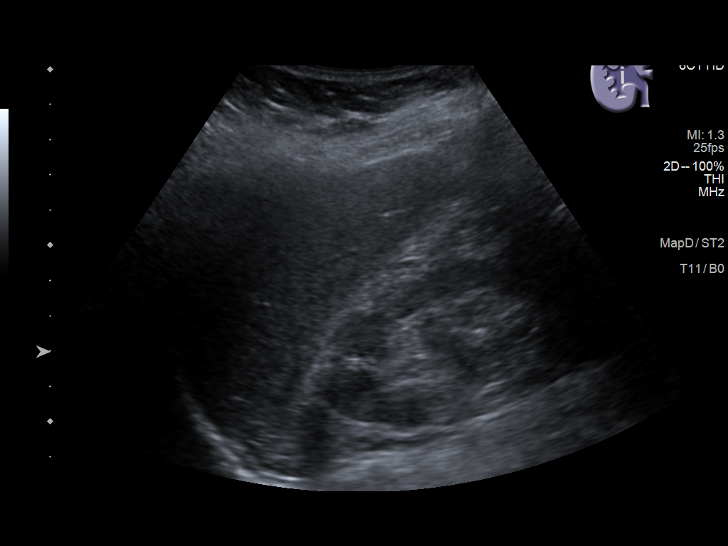
[im 4/42]
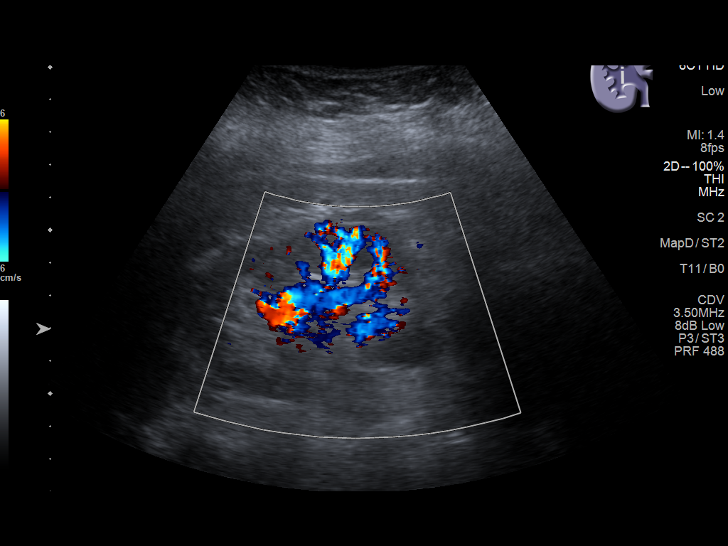
[im 7/42]
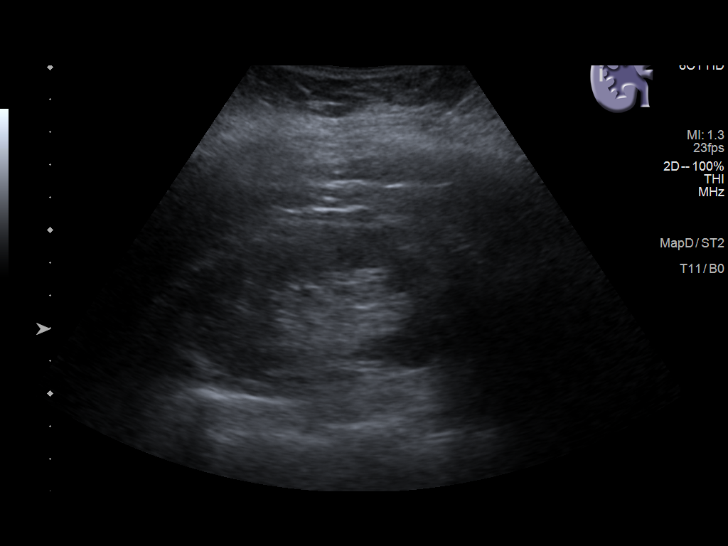
[im 11/42]
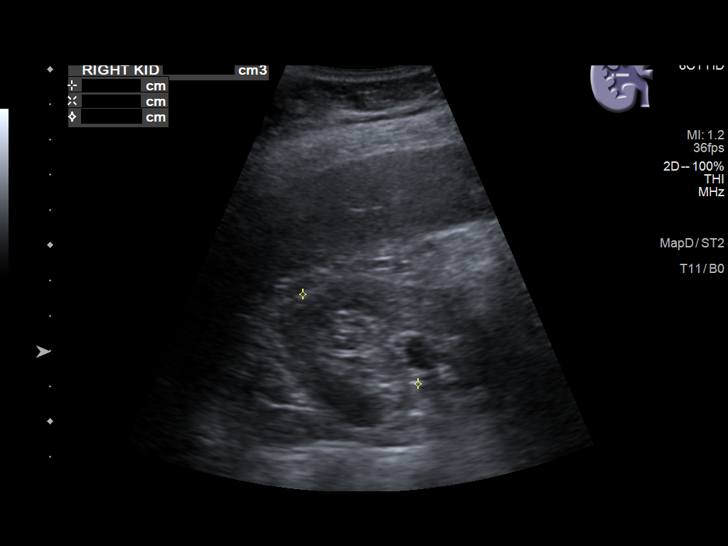
[im 14/42]
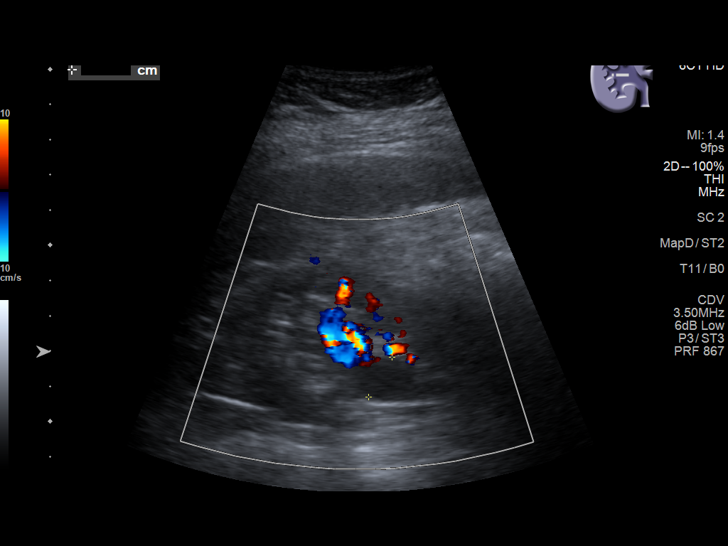
[im 16/42]
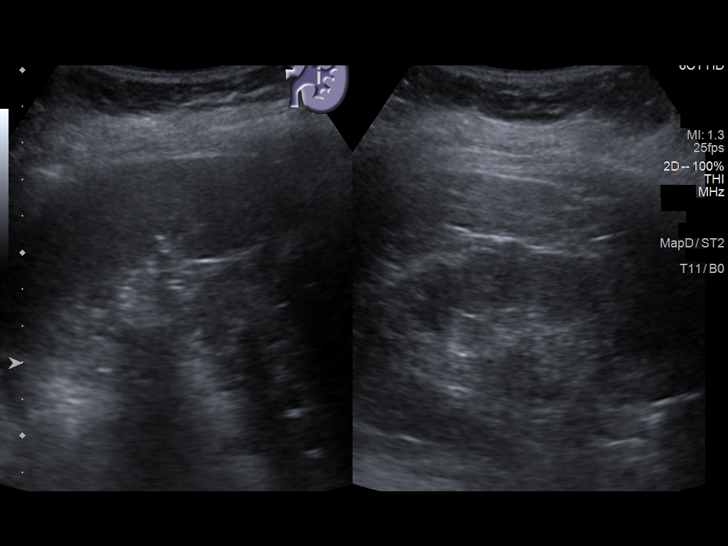
[im 19/42]
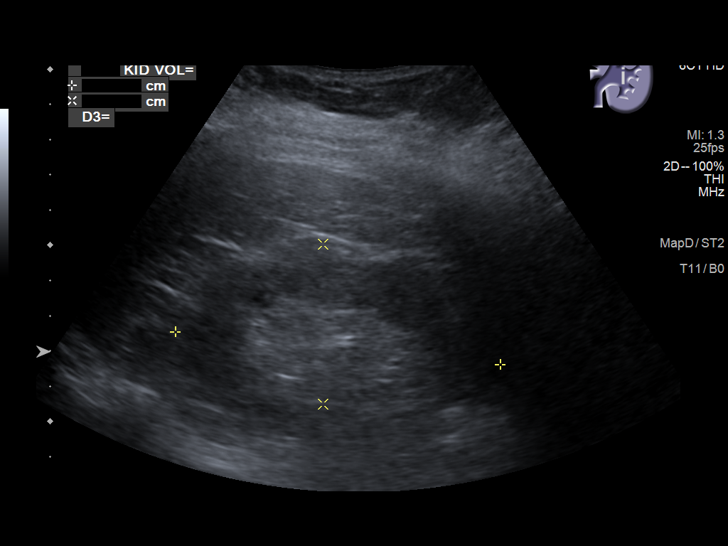
[im 23/42]
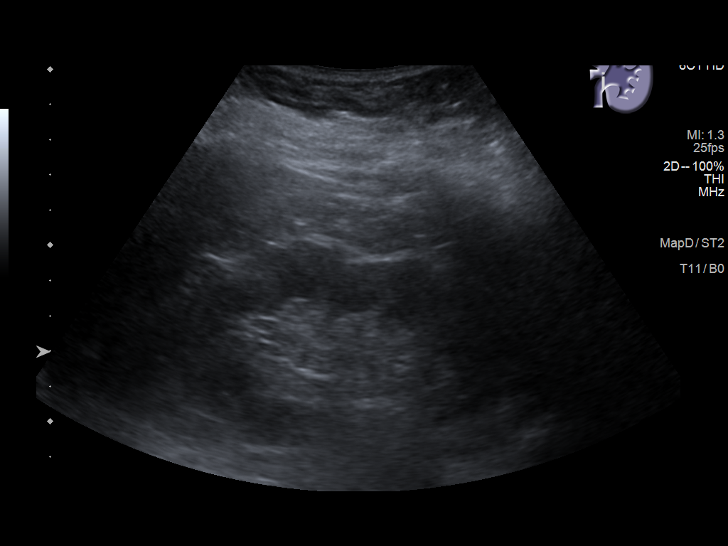
[im 26/42]
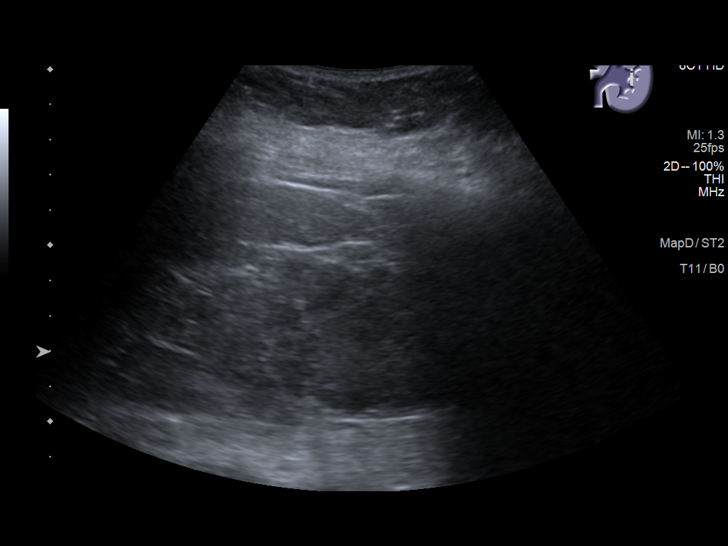
[im 28/42]
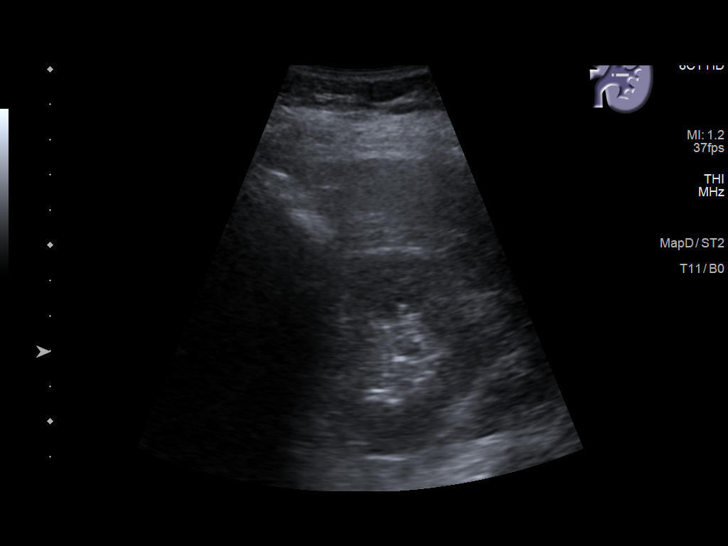
[im 31/42]
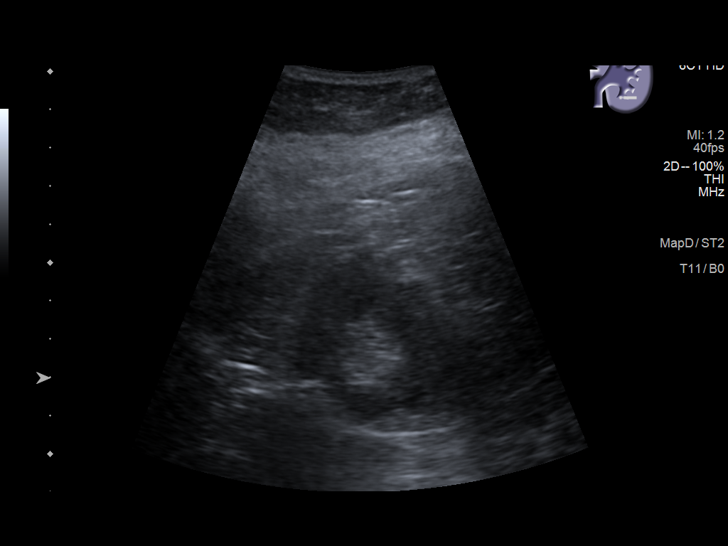
[im 35/42]
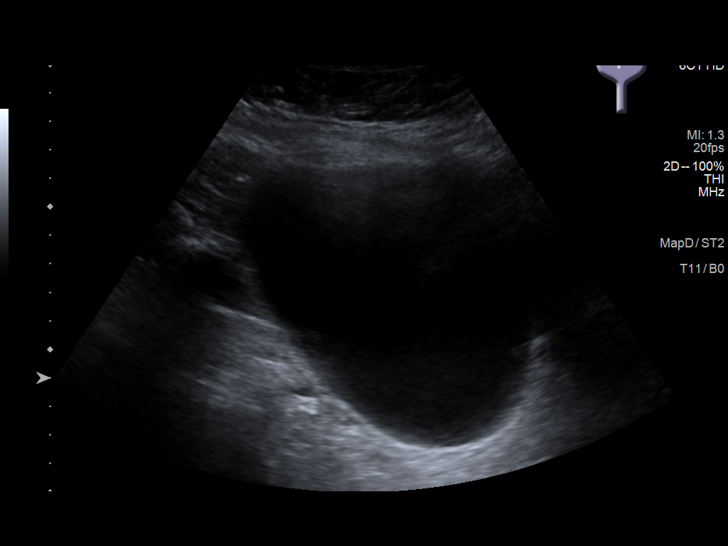
[im 38/42]
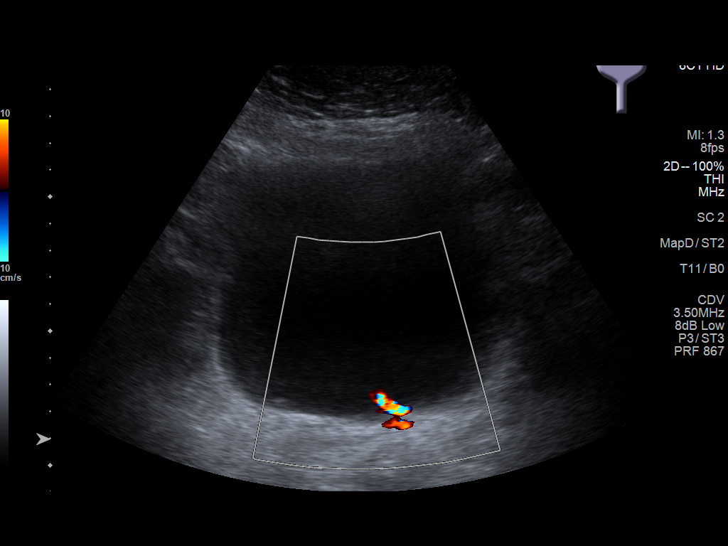
[im 42/42]
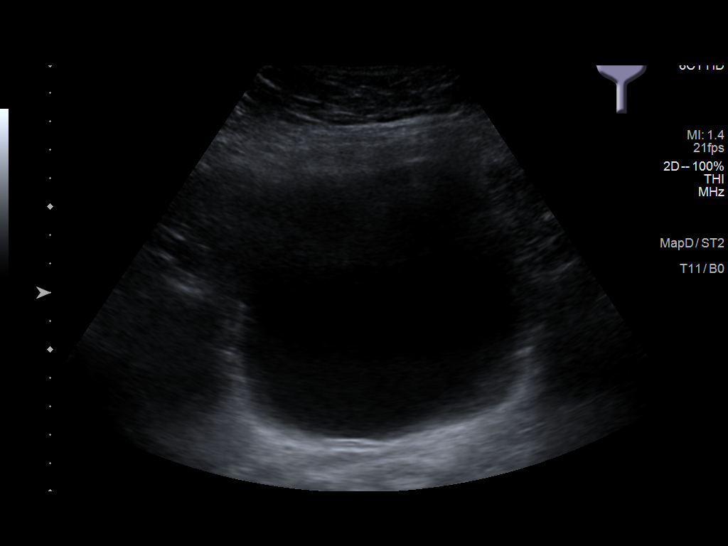

[14 of 25 positions shown; findings below may reference images not displayed]

FINDINGS: Right Kidney:

Renal measurements: 9.5 x 4.8 x 4.1 cm = volume: 99.0 mL .
Echogenicity within normal limits. No mass. Mild right renal pelvic
fullness. No calyceal dilatation noted.

Left Kidney:

Renal measurements: 9.3 x 4.5 x 4.7 cm = volume: 104.0 mL.
Echogenicity within normal limits. No mass or hydronephrosis
visualized.

Bladder:

Bladder is slightly distended. Debris noted within the bladder.
Bilateral ureteral jets noted.

Other:

None.
IMPRESSION: 1.  Mild right renal pelvic fullness.  No calyceal dilatation.

2. Bladder is slightly distended. Debris noted within the bladder.
Bilateral ureteral jets noted.

## 2020-12-27 LAB — COMPREHENSIVE METABOLIC PANEL
ALT: 18 IU/L (ref 0–32)
AST: 19 IU/L (ref 0–40)
Albumin/Globulin Ratio: 1.7 (ref 1.2–2.2)
Albumin: 4.5 g/dL (ref 3.8–4.8)
Alkaline Phosphatase: 89 IU/L (ref 44–121)
BUN/Creatinine Ratio: 24 (ref 12–28)
BUN: 31 mg/dL — ABNORMAL HIGH (ref 8–27)
Bilirubin Total: 0.2 mg/dL (ref 0.0–1.2)
CO2: 19 mmol/L — ABNORMAL LOW (ref 20–29)
Calcium: 10 mg/dL (ref 8.7–10.3)
Chloride: 103 mmol/L (ref 96–106)
Creatinine, Ser: 1.29 mg/dL — ABNORMAL HIGH (ref 0.57–1.00)
Globulin, Total: 2.7 g/dL (ref 1.5–4.5)
Glucose: 150 mg/dL — ABNORMAL HIGH (ref 65–99)
Potassium: 4.9 mmol/L (ref 3.5–5.2)
Sodium: 140 mmol/L (ref 134–144)
Total Protein: 7.2 g/dL (ref 6.0–8.5)
eGFR: 47 mL/min/{1.73_m2} — ABNORMAL LOW (ref >59)

## 2020-12-27 LAB — CBC WITH DIFFERENTIAL/PLATELET
Basophils Absolute: 0 10*3/uL (ref 0.0–0.2)
Basos: 0 %
EOS (ABSOLUTE): 0.2 10*3/uL (ref 0.0–0.4)
Eos: 3 %
Hematocrit: 33.2 % — ABNORMAL LOW (ref 34.0–46.6)
Hemoglobin: 10.9 g/dL — ABNORMAL LOW (ref 11.1–15.9)
Immature Grans (Abs): 0 10*3/uL (ref 0.0–0.1)
Immature Granulocytes: 0 %
Lymphocytes Absolute: 2.5 10*3/uL (ref 0.7–3.1)
Lymphs: 36 %
MCH: 26.5 pg — ABNORMAL LOW (ref 26.6–33.0)
MCHC: 32.8 g/dL (ref 31.5–35.7)
MCV: 81 fL (ref 79–97)
Monocytes Absolute: 0.5 10*3/uL (ref 0.1–0.9)
Monocytes: 7 %
Neutrophils Absolute: 3.7 10*3/uL (ref 1.4–7.0)
Neutrophils: 54 %
Platelets: 341 10*3/uL (ref 150–450)
RBC: 4.11 x10E6/uL (ref 3.77–5.28)
RDW: 14.4 % (ref 11.7–15.4)
WBC: 6.9 10*3/uL (ref 3.4–10.8)

## 2020-12-27 LAB — LIPID PANEL WITH LDL/HDL RATIO
Cholesterol, Total: 174 mg/dL (ref 100–199)
HDL: 55 mg/dL (ref >39)
LDL Chol Calc (NIH): 82 mg/dL (ref 0–99)
LDL/HDL Ratio: 1.5 ratio (ref 0.0–3.2)
Triglycerides: 225 mg/dL — ABNORMAL HIGH (ref 0–149)
VLDL Cholesterol Cal: 37 mg/dL (ref 5–40)

## 2020-12-27 LAB — IRON,TIBC AND FERRITIN PANEL
Ferritin: 8 ng/mL — ABNORMAL LOW (ref 15–150)
Iron Saturation: 8 % — CL (ref 15–55)
Iron: 31 ug/dL (ref 27–139)
Total Iron Binding Capacity: 395 ug/dL (ref 250–450)
UIBC: 364 ug/dL (ref 118–369)

## 2020-12-27 LAB — T4, FREE: Free T4: 1.23 ng/dL (ref 0.82–1.77)

## 2020-12-27 LAB — CMP14+EGFR
ALT: 19 IU/L (ref 0–32)
AST: 20 IU/L (ref 0–40)
Albumin/Globulin Ratio: 1.7 (ref 1.2–2.2)
Albumin: 4.4 g/dL (ref 3.8–4.8)
Alkaline Phosphatase: 84 IU/L (ref 44–121)
BUN/Creatinine Ratio: 22 (ref 12–28)
BUN: 30 mg/dL — ABNORMAL HIGH (ref 8–27)
Bilirubin Total: <0.2 mg/dL (ref 0.0–1.2)
CO2: 16 mmol/L — ABNORMAL LOW (ref 20–29)
Calcium: 10 mg/dL (ref 8.7–10.3)
Chloride: 102 mmol/L (ref 96–106)
Creatinine, Ser: 1.35 mg/dL — ABNORMAL HIGH (ref 0.57–1.00)
Globulin, Total: 2.6 g/dL (ref 1.5–4.5)
Glucose: 151 mg/dL — ABNORMAL HIGH (ref 65–99)
Potassium: 4.9 mmol/L (ref 3.5–5.2)
Sodium: 140 mmol/L (ref 134–144)
Total Protein: 7 g/dL (ref 6.0–8.5)
eGFR: 44 mL/min/{1.73_m2} — ABNORMAL LOW (ref >59)

## 2020-12-27 LAB — B12 AND FOLATE PANEL
Folate: 6 ng/mL (ref >3.0)
Vitamin B-12: 1319 pg/mL — ABNORMAL HIGH (ref 232–1245)

## 2020-12-27 LAB — TSH: TSH: 2.19 u[IU]/mL (ref 0.450–4.500)

## 2020-12-29 ENCOUNTER — Encounter: Payer: Self-pay | Admitting: Internal Medicine

## 2020-12-29 NOTE — Progress Notes (Signed)
I have reviewed all the lab results. There are some abnormalities that are not critical to the patient's health, but I would like to discuss these in person at an office appointment. Please ask her to schedule a follow up visit with me at her convenience.

## 2020-12-30 ENCOUNTER — Telehealth: Payer: Self-pay

## 2020-12-30 NOTE — Telephone Encounter (Signed)
Dr Humphrey Rolls spoke to pt on the phone about labs.

## 2020-12-30 NOTE — Telephone Encounter (Signed)
-----   Message from Lavera Guise, MD sent at 12/29/2020 11:07 AM EDT ----- I have reviewed all the lab results. There are some abnormalities that are not critical to the patient's health, but I would like to discuss these in person at an office appointment. Please ask her to schedule a follow up visit with me at her conveni ence.

## 2021-01-01 ENCOUNTER — Other Ambulatory Visit: Payer: Self-pay | Admitting: Internal Medicine

## 2021-01-01 DIAGNOSIS — D508 Other iron deficiency anemias: Secondary | ICD-10-CM

## 2021-01-01 DIAGNOSIS — N182 Chronic kidney disease, stage 2 (mild): Secondary | ICD-10-CM

## 2021-01-01 DIAGNOSIS — D638 Anemia in other chronic diseases classified elsewhere: Secondary | ICD-10-CM

## 2021-01-19 ENCOUNTER — Telehealth: Payer: Self-pay

## 2021-01-19 ENCOUNTER — Other Ambulatory Visit: Payer: Self-pay | Admitting: Internal Medicine

## 2021-01-19 DIAGNOSIS — F5101 Primary insomnia: Secondary | ICD-10-CM

## 2021-01-19 MED ORDER — ZOLPIDEM TARTRATE 10 MG PO TABS: 10.0000 mg | ORAL_TABLET | Freq: Every evening | ORAL | 3 refills | Status: DC | PRN

## 2021-01-19 NOTE — Telephone Encounter (Signed)
done

## 2021-01-26 ENCOUNTER — Encounter: Attending: Neurology | Primary: Internal Medicine

## 2021-01-26 ENCOUNTER — Encounter: Payer: Self-pay | Admitting: Oncology

## 2021-01-26 ENCOUNTER — Inpatient Hospital Stay: Payer: BC Managed Care – PPO | Attending: Oncology | Admitting: Oncology

## 2021-01-26 ENCOUNTER — Inpatient Hospital Stay: Payer: BC Managed Care – PPO

## 2021-01-26 VITALS — BP 143/59 | HR 80 | Temp 97.5°F | Resp 17 | Wt 172.0 lb

## 2021-01-26 DIAGNOSIS — N1832 Chronic kidney disease, stage 3b: Secondary | ICD-10-CM | POA: Diagnosis present

## 2021-01-26 DIAGNOSIS — Z79899 Other long term (current) drug therapy: Secondary | ICD-10-CM | POA: Insufficient documentation

## 2021-01-26 DIAGNOSIS — Z794 Long term (current) use of insulin: Secondary | ICD-10-CM | POA: Insufficient documentation

## 2021-01-26 DIAGNOSIS — Z800 Family history of malignant neoplasm of digestive organs: Secondary | ICD-10-CM | POA: Insufficient documentation

## 2021-01-26 DIAGNOSIS — D631 Anemia in chronic kidney disease: Secondary | ICD-10-CM | POA: Diagnosis present

## 2021-01-26 DIAGNOSIS — E1122 Type 2 diabetes mellitus with diabetic chronic kidney disease: Secondary | ICD-10-CM | POA: Diagnosis not present

## 2021-01-26 DIAGNOSIS — Z801 Family history of malignant neoplasm of trachea, bronchus and lung: Secondary | ICD-10-CM | POA: Diagnosis not present

## 2021-01-26 DIAGNOSIS — D508 Other iron deficiency anemias: Secondary | ICD-10-CM | POA: Insufficient documentation

## 2021-01-26 DIAGNOSIS — Z7982 Long term (current) use of aspirin: Secondary | ICD-10-CM | POA: Diagnosis not present

## 2021-01-26 DIAGNOSIS — I129 Hypertensive chronic kidney disease with stage 1 through stage 4 chronic kidney disease, or unspecified chronic kidney disease: Secondary | ICD-10-CM | POA: Diagnosis not present

## 2021-01-26 NOTE — Progress Notes (Signed)
Hematology/Oncology Consult note [facility] [phone] [phone]   Patient Care Team: Lavera Guise, MD as PCP - General (Internal Medicine)  REFERRING PROVIDER: Lavera Guise, MD  CHIEF COMPLAINTS/REASON FOR VISIT:  Evaluation of anemia  HISTORY OF PRESENTING ILLNESS:  Chelsea Rivera is a  60 y.o.  female with PMH listed below who was referred to me for evaluation of anemia Reviewed patient's recent labs that was done.  12/26/2020 Labs revealed anemia with hemoglobin of 10.9.   Iron saturation was 8, ferritin was 8.   Reviewed patient's previous labs ordered by primary care physician's office, anemia is chronic onset , duration is since at least 2017 01/20/2021 she has blood work done at nephrologist office. Hb was 10.4  Associated signs and symptoms: Patient reports fatigue.  SOB with exertion.  Denies weight loss, easy bruising, hematochezia, hemoptysis, hematuria. Patient has history CKD, follows up with Dr.Kolluru.  01/20/2022 her creatinine level has improved.  She has diverticulosis and had colonoscopy done in 2020.  She reports that years ago she has had capsule endoscopy.  Denies any black or bloody stool, denies any abdominal pain.   Review of Systems  Constitutional:  Positive for fatigue. Negative for appetite change, chills and fever.  HENT:   Negative for hearing loss and voice change.   Eyes:  Negative for eye problems.  Respiratory:  Negative for chest tightness and cough.   Cardiovascular:  Negative for chest pain.  Gastrointestinal:  Negative for abdominal distention, abdominal pain and blood in stool.  Endocrine: Negative for hot flashes.  Genitourinary:  Negative for difficulty urinating and frequency.   Musculoskeletal:  Negative for arthralgias.  Skin:  Negative for itching and rash.  Neurological:  Negative for extremity weakness.  Hematological:  Negative for adenopathy.  Psychiatric/Behavioral:  Negative  for confusion.     MEDICAL HISTORY:  Past Medical History:  Diagnosis Date   Diabetes mellitus without complication ([place])    GERD (gastroesophageal reflux disease)    Hyperlipidemia    Hypertension     SURGICAL HISTORY: Past Surgical History:  Procedure Laterality Date   BREAST BIOPSY Right 2012   cleaned out fistula, not a biopsy   CHOLECYSTECTOMY     COLONOSCOPY WITH PROPOFOL N/A 07/07/2018   Procedure: COLONOSCOPY WITH PROPOFOL;  Surgeon: Lollie Sails, MD;  Location: [facility] ENDOSCOPY;  Service: Endoscopy;  Laterality: N/A;   SHOULDER ARTHROSCOPY      SOCIAL HISTORY: Social History   Socioeconomic History   Marital status: Married    Spouse name: Not on file   Number of children: Not on file   Years of education: Not on file   Highest education level: Not on file  Occupational History   Not on file  Tobacco Use   Smoking status: Never   Smokeless tobacco: Never  Vaping Use   Vaping Use: Never used  Substance and Sexual Activity   Alcohol use: Yes    Comment: socially   Drug use: No   Sexual activity: Not on file  Other Topics Concern   Not on file  Social History Narrative   Not on file   Social Determinants of Health   Financial Resource Strain: Not on file  Food Insecurity: Not on file  Transportation Needs: Not on file  Physical Activity: Not on file  Stress: Not on file  Social Connections: Not on file  Intimate Partner Violence: Not on file    FAMILY HISTORY: Family History  Problem  Relation Age of Onset   Lung cancer Mother    Colon cancer Father    Breast cancer Neg Hx     ALLERGIES:  has No Known Allergies.  MEDICATIONS:  Current Outpatient Medications  Medication Sig Dispense Refill   aspirin EC 81 MG tablet Take 81 mg by mouth daily.     atorvastatin (LIPITOR) 10 MG tablet TAKE 1/2 TABLET EVERYDAY FOR HIGH CHOLESTEROL 45 tablet 5   bisoprolol (ZEBETA) 5 MG tablet TAKE 1 TABLET BY MOUTH AT BEDTIME 30 tablet 5   Continuous Blood  Gluc Receiver (DEXCOM G6 RECEIVER) DEVI Use as directed for continuous glucose monitoring - E11.65 3 each 3   Continuous Blood Gluc Sensor (DEXCOM G6 SENSOR) MISC To use as directed with Dexcom sensor for continuous glucose monitoring. 3 each 3   Continuous Blood Gluc Transmit (DEXCOM G6 TRANSMITTER) MISC Use as directed for continuous glucose monitoring  - E11.65 1 each 3   COVID-19 mRNA Vac-TriS, Pfizer, (PFIZER-BIONT COVID-19 VAC-TRIS) SUSP injection Inject into the muscle. 0.3 mL 0   cyclobenzaprine (FLEXERIL) 10 MG tablet Take 1 tablet (10 mg total) by mouth at bedtime. 30 tablet 1   insulin aspart (NOVOLOG) 100 UNIT/ML injection Use as  directed with insulin pump with omnipod  and sliding scale maximum 50 units a day 20 mL 3   Insulin Pen Needle (BD PEN NEEDLE NANO U/F) 32G X 4 MM MISC Use as directed 100 each 1   LINZESS 290 MCG CAPS capsule TAKE 1 CAPSULE BY MOUTH EVERY DAY BEFOREBREAKFAST 90 capsule 3   metFORMIN (GLUCOPHAGE) 1000 MG tablet Take 1 tablet (1,000 mg total) by mouth 2 (two) times daily. 180 tablet 3   omeprazole (PRILOSEC) 40 MG capsule TAKE 1 CAPSULE BY MOUTH ONCE DAILY 30 capsule 5   ondansetron (ZOFRAN) 8 MG tablet Take 1 tab po twice a daily as needed for nausea 60 tablet 2   ONETOUCH ULTRA test strip TEST BLOOD SUGAR 3 TIMES DAILY AS DIRECTED 123XX123 each 1   TRULICITY 1.5 0000000 SOPN INJECT 1.5 MG ONCE EVERY 7 DAYS 2 mL 3   zolpidem (AMBIEN) 10 MG tablet Take 1 tablet (10 mg total) by mouth at bedtime as needed. 30 tablet 3   conjugated estrogens (PREMARIN) vaginal cream 1 applicator vaginally twice weekly 42.5 g 1   insulin detemir (LEVEMIR FLEXTOUCH) 100 UNIT/ML FlexPen 35 units at bedtime 15 mL 3   phentermine (ADIPEX-P) 37.5 MG tablet Take 1 tablet (37.5 mg total) by mouth daily before breakfast. 30 tablet 1   torsemide (DEMADEX) 10 MG tablet Take 10 mg by mouth 3 (three) times a week.     No current facility-administered medications for this visit.     PHYSICAL  EXAMINATION: ECOG PERFORMANCE STATUS: 0 - Asymptomatic Vitals:   01/26/21 0927  BP: (!) 143/59  Pulse: 80  Resp: 17  Temp: (!) 97.5 F (36.4 C)  SpO2: 100%   Filed Weights   01/26/21 0927  Weight: 172 lb (78 kg)    Physical Exam Constitutional:      General: She is not in acute distress. HENT:     Head: Normocephalic and atraumatic.  Eyes:     General: No scleral icterus. Cardiovascular:     Rate and Rhythm: Normal rate and regular rhythm.     Heart sounds: Normal heart sounds.  Pulmonary:     Effort: Pulmonary effort is normal. No respiratory distress.     Breath sounds: No wheezing.  Abdominal:  General: Bowel sounds are normal. There is no distension.     Palpations: Abdomen is soft.  Musculoskeletal:        General: No deformity. Normal range of motion.     Cervical back: Normal range of motion and neck supple.  Skin:    General: Skin is warm and dry.     Findings: No erythema or rash.  Neurological:     Mental Status: She is alert and oriented to person, place, and time. Mental status is at baseline.     Cranial Nerves: No cranial nerve deficit.     Coordination: Coordination normal.  Psychiatric:        Mood and Affect: Mood normal.     LABORATORY DATA:  I have reviewed the data as listed Lab Results  Component Value Date   WBC 6.9 12/26/2020   HGB 10.9 (L) 12/26/2020   HCT 33.2 (L) 12/26/2020   MCV 81 12/26/2020   PLT 341 12/26/2020   Recent Labs    12/26/20 1622 12/26/20 1623  NA 140 140  K 4.9 4.9  CL 103 102  CO2 19* 16*  GLUCOSE 150* 151*  BUN 31* 30*  CREATININE 1.29* 1.35*  CALCIUM 10.0 10.0  PROT 7.2 7.0  ALBUMIN 4.5 4.4  AST 19 20  ALT 18 19  ALKPHOS 89 84  BILITOT 0.2 <0.2   Iron/TIBC/Ferritin/ %Sat    Component Value Date/Time   IRON 31 12/26/2020 1622   TIBC 395 12/26/2020 1622   FERRITIN 8 (L) 12/26/2020 1622   IRONPCTSAT 8 (LL) 12/26/2020 1622        ASSESSMENT & PLAN:  1. Other iron deficiency anemia    2. Anemia due to stage 3b chronic kidney disease (HCC)    Anemia is likely a combination of iron deficiency and CKD. SPEP was checked by nephrologist previously and was negative for M protien.  Plan IV iron with Venofer '200mg'$  weekly x 4 doses. Allergy reactions/infusion reaction including anaphylactic reaction discussed with patient. Other side effects include but not limited to high blood pressure, skin rash, weight gain, leg swelling, etc. Patient voices understanding and willing to proceed.  Follow up in 8-10 week lab MD +/- Venofer     All questions were answered. The patient knows to call the clinic with any problems questions or concerns. Cc. Lavera Guise, MD  Thank you for this kind referral and the opportunity to participate in the care of this patient. A copy of today's note is routed to referring provider     Earlie Server, MD, PhD 01/26/2021

## 2021-01-26 NOTE — Progress Notes (Signed)
Patient here for initial hematology appointment, concerns of fatigue, constipation and occasional SOB

## 2021-01-27 ENCOUNTER — Other Ambulatory Visit: Payer: Self-pay

## 2021-01-27 ENCOUNTER — Encounter: Payer: Self-pay | Admitting: Oncology

## 2021-01-27 ENCOUNTER — Other Ambulatory Visit: Payer: Self-pay | Admitting: Oncology

## 2021-01-27 ENCOUNTER — Other Ambulatory Visit: Payer: Self-pay | Admitting: Internal Medicine

## 2021-01-27 DIAGNOSIS — D508 Other iron deficiency anemias: Secondary | ICD-10-CM

## 2021-01-27 DIAGNOSIS — D509 Iron deficiency anemia, unspecified: Secondary | ICD-10-CM

## 2021-01-27 HISTORY — DX: Iron deficiency anemia, unspecified: D50.9

## 2021-01-29 ENCOUNTER — Inpatient Hospital Stay: Payer: BC Managed Care – PPO

## 2021-01-29 VITALS — BP 142/82 | HR 81

## 2021-01-29 DIAGNOSIS — D508 Other iron deficiency anemias: Secondary | ICD-10-CM | POA: Diagnosis not present

## 2021-01-29 MED ORDER — SODIUM CHLORIDE 0.9 % IV SOLN
Freq: Once | INTRAVENOUS | Status: AC
  Filled 2021-01-29: qty 250

## 2021-01-29 MED ORDER — IRON SUCROSE 20 MG/ML IV SOLN
200.0000 mg | Freq: Once | INTRAVENOUS | Status: AC
  Administered 2021-01-29: 200 mg via INTRAVENOUS
  Filled 2021-01-29: qty 10

## 2021-01-29 MED ORDER — SODIUM CHLORIDE 0.9 % IV SOLN: 200.0000 mg | Freq: Once | INTRAVENOUS | Status: DC

## 2021-02-05 ENCOUNTER — Inpatient Hospital Stay: Payer: BC Managed Care – PPO

## 2021-02-05 ENCOUNTER — Other Ambulatory Visit: Payer: Self-pay

## 2021-02-05 VITALS — BP 120/59 | HR 80

## 2021-02-05 DIAGNOSIS — D508 Other iron deficiency anemias: Secondary | ICD-10-CM | POA: Diagnosis not present

## 2021-02-05 MED ORDER — IRON SUCROSE 20 MG/ML IV SOLN
200.0000 mg | Freq: Once | INTRAVENOUS | Status: AC
  Administered 2021-02-05: 200 mg via INTRAVENOUS
  Filled 2021-02-05: qty 10

## 2021-02-05 MED ORDER — SODIUM CHLORIDE 0.9 % IV SOLN
Freq: Once | INTRAVENOUS | Status: AC
Start: 2021-02-05 — End: 2021-02-05
  Filled 2021-02-05: qty 250

## 2021-02-05 MED ORDER — SODIUM CHLORIDE 0.9 % IV SOLN: 200.0000 mg | Freq: Once | INTRAVENOUS | Status: DC

## 2021-02-12 ENCOUNTER — Inpatient Hospital Stay: Payer: BC Managed Care – PPO | Attending: Oncology

## 2021-02-12 VITALS — BP 134/77 | HR 88 | Temp 96.3°F | Resp 18

## 2021-02-12 DIAGNOSIS — D631 Anemia in chronic kidney disease: Secondary | ICD-10-CM | POA: Insufficient documentation

## 2021-02-12 DIAGNOSIS — D508 Other iron deficiency anemias: Secondary | ICD-10-CM | POA: Diagnosis not present

## 2021-02-12 DIAGNOSIS — N1832 Chronic kidney disease, stage 3b: Secondary | ICD-10-CM | POA: Insufficient documentation

## 2021-02-12 MED ORDER — IRON SUCROSE 20 MG/ML IV SOLN
200.0000 mg | Freq: Once | INTRAVENOUS | Status: AC
  Administered 2021-02-12: 200 mg via INTRAVENOUS
  Filled 2021-02-12: qty 10

## 2021-02-12 MED ORDER — SODIUM CHLORIDE 0.9 % IV SOLN
Freq: Once | INTRAVENOUS | Status: AC
  Filled 2021-02-12: qty 250

## 2021-02-12 MED ORDER — SODIUM CHLORIDE 0.9 % IV SOLN: 200.0000 mg | Freq: Once | INTRAVENOUS | Status: DC

## 2021-02-12 NOTE — Patient Instructions (Signed)
[facility]  Discharge Instructions: Thank you for choosing [facility] to provide your oncology and hematology care.  If you have a lab appointment with the Cancer Center, please go directly to the Cancer Center and check in at the registration area.  Wear comfortable clothing and clothing appropriate for easy access to any Portacath or PICC line.   We strive to give you quality time with your provider. You may need to reschedule your appointment if you arrive late (15 or more minutes).  Arriving late affects you and other patients whose appointments are after yours.  Also, if you miss three or more appointments without notifying the office, you may be dismissed from the clinic at the provider's discretion.      For prescription refill requests, have your pharmacy contact our office and allow 72 hours for refills to be completed.    Today you received the following chemotherapy and/or immunotherapy agents VENOFER      To help prevent nausea and vomiting after your treatment, we encourage you to take your nausea medication as directed.  BELOW ARE SYMPTOMS THAT SHOULD BE REPORTED IMMEDIATELY: *FEVER GREATER THAN 100.4 F (38 C) OR HIGHER *CHILLS OR SWEATING *NAUSEA AND VOMITING THAT IS NOT CONTROLLED WITH YOUR NAUSEA MEDICATION *UNUSUAL SHORTNESS OF BREATH *UNUSUAL BRUISING OR BLEEDING *URINARY PROBLEMS (pain or burning when urinating, or frequent urination) *BOWEL PROBLEMS (unusual diarrhea, constipation, pain near the anus) TENDERNESS IN MOUTH AND THROAT WITH OR WITHOUT PRESENCE OF ULCERS (sore throat, sores in mouth, or a toothache) UNUSUAL RASH, SWELLING OR PAIN  UNUSUAL VAGINAL DISCHARGE OR ITCHING   Items with * indicate a potential emergency and should be followed up as soon as possible or go to the Emergency Department if any problems should occur.  Please show the CHEMOTHERAPY ALERT CARD or IMMUNOTHERAPY ALERT CARD at check-in to  the Emergency Department and triage nurse.  Should you have questions after your visit or need to cancel or reschedule your appointment, please contact [facility] [place] [facility]  [phone] and follow the prompts.  Office hours are 8:00 a.m. to 4:30 p.m. Monday - Friday. Please note that voicemails left after 4:00 p.m. may not be returned until the following business day.  We are closed weekends and major holidays. You have access to a nurse at all times for urgent questions. Please call the main number to the clinic [phone] and follow the prompts.  For any non-urgent questions, you may also contact your provider using MyChart. We now offer e-Visits for anyone 18 and older to request care online for non-urgent symptoms. For details visit [email or web address].   Also download the MyChart app! Go to the app store, search "MyChart", open the app, select [facility], and log in with your MyChart username and password.  Due to Covid, a mask is required upon entering the hospital/clinic. If you do not have a mask, one will be given to you upon arrival. For doctor visits, patients may have 1 support person aged 18 or older with them. For treatment visits, patients cannot have anyone with them due to current Covid guidelines and our immunocompromised population.   Iron Sucrose Injection What is this medication? IRON SUCROSE (EYE ern SOO krose) treats low levels of iron (iron deficiency anemia) in people with kidney disease. Iron is a mineral that plays an important role in making red blood cells, which carry oxygen from your lungs to the rest of your body. This medicine may   be used for other purposes; ask your health care provider or pharmacist if you have questions. COMMON BRAND NAME(S): Venofer What should I tell my care team before I take this medication? They need to know if you have any of these conditions: Anemia not caused by low iron levels Heart disease High levels  of iron in the blood Kidney disease Liver disease An unusual or allergic reaction to iron, other medications, foods, dyes, or preservatives Pregnant or trying to get pregnant Breast-feeding How should I use this medication? This medication is for infusion into a vein. It is given in a hospital or clinic setting. Talk to your care team about the use of this medication in children. While this medication may be prescribed for children as young as 2 years for selected conditions, precautions do apply. Overdosage: If you think you have taken too much of this medicine contact a poison control center or emergency room at once. NOTE: This medicine is only for you. Do not share this medicine with others. What if I miss a dose? It is important not to miss your dose. Call your care team if you are unable to keep an appointment. What may interact with this medication? Do not take this medication with any of the following: Deferoxamine Dimercaprol Other iron products This medication may also interact with the following: Chloramphenicol Deferasirox This list may not describe all possible interactions. Give your health care provider a list of all the medicines, herbs, non-prescription drugs, or dietary supplements you use. Also tell them if you smoke, drink alcohol, or use illegal drugs. Some items may interact with your medicine. What should I watch for while using this medication? Visit your care team regularly. Tell your care team if your symptoms do not start to get better or if they get worse. You may need blood work done while you are taking this medication. You may need to follow a special diet. Talk to your care team. Foods that contain iron include: whole grains/cereals, dried fruits, beans, or peas, leafy green vegetables, and organ meats (liver, kidney). What side effects may I notice from receiving this medication? Side effects that you should report to your care team as soon as  possible: Allergic reactions-skin rash, itching, hives, swelling of the face, lips, tongue, or throat Low blood pressure-dizziness, feeling faint or lightheaded, blurry vision Shortness of breath Side effects that usually do not require medical attention (report to your care team if they continue or are bothersome): Flushing Headache Joint pain Muscle pain Nausea Pain, redness, or irritation at injection site This list may not describe all possible side effects. Call your doctor for medical advice about side effects. You may report side effects to FDA at 1-800-FDA-1088. Where should I keep my medication? This medication is given in a hospital or clinic and will not be stored at home. NOTE: This sheet is a summary. It may not cover all possible information. If you have questions about this medicine, talk to your doctor, pharmacist, or health care provider.  2022 Elsevier/Gold Standard (2020-07-22 12:52:06)  

## 2021-02-17 ENCOUNTER — Encounter: Attending: Neurology | Primary: Internal Medicine

## 2021-02-19 ENCOUNTER — Ambulatory Visit: Admit: 2021-02-19 | Discharge: 2021-02-19 | Payer: MEDICAID | Attending: Neurology | Primary: Internal Medicine

## 2021-02-19 DIAGNOSIS — G40909 Epilepsy, unspecified, not intractable, without status epilepticus: Secondary | ICD-10-CM

## 2021-02-19 NOTE — Progress Notes (Signed)
Department of Neurological Sciences    02/19/21       Impression:   Diagnosis Orders   1. Seizure disorder [facility])        Presents for follow up of symptomatic epilepsy.  No seizure recurrence.  Interested in stopping all ASMs.    Plan:   She elected to continue topamax rather than proceeding with further EEG monitoring to see if she still has epileptiform discharges.  RTC 1 year.      CHIEF COMPLAINT:   Chief Complaint   Patient presents with    Follow-up     seizures          HISTORY OF PRESENT ILLNESS:     The patient is a 60 y.o.  female with a history of hypertension, hypothyroidism, osteoarthritis, meningioma resection, symptomatic epilepsy who presents for follow-up appointment.  She was last seen by Dr. Lilyan Punt in 2021.   Patient denies recurrence of seizure recurrence.  She remains on Topamax 100 mg in the morning and 150 mg at bedtime.  She is interested in stopping seizure medications.  No side effects apart from paresthesias. Her last EEG showed epileptiform discharges in 2020.  MRI brain is pending.            Past Medical History:    Past Medical History:   Diagnosis Date    Allergic rhinitis     Breast cancer (HCC)     Chronic back pain     Colitis     Fibromyalgia     Hypertension     Hypothyroidism     Irritable bowel syndrome     Neuropathy     Osteoarthritis     back    Restless legs syndrome         Past Surgical History:   Past Surgical History:   Procedure Laterality Date    BREAST SURGERY      CHOLECYSTECTOMY      COLONOSCOPY      CRANIAL LESION RESECTION  2019    INNER EAR SURGERY      both sides    TONSILLECTOMY AND ADENOIDECTOMY          Medications:   Current Outpatient Medications   Medication Sig Dispense Refill    benzonatate (TESSALON) 100 MG capsule Take 200 mg by mouth 3 times daily as needed for Cough      fexofenadine (ALLEGRA) 180 MG tablet Take 180 mg by mouth daily      fluticasone (FLONASE) 50 MCG/ACT nasal spray 1 spray by Each Nostril route daily      olopatadine (PATANOL) 0.1  % ophthalmic solution 1 drop 2 times daily      loperamide (IMODIUM) 2 MG capsule Take 2 mg by mouth 4 times daily as needed for Diarrhea      Ascorbic Acid (VITAMIN C) 1000 MG tablet Take 1,000 mg by mouth daily      zinc 50 MG TABS tablet Take 50 mg by mouth daily      Cyanocobalamin (HM VITAMIN B12 PO) Take by mouth      topiramate (TOPAMAX) 100 MG tablet TAKE 1 TABLET BY MOUTH IN THE MORNING AND 1 & 1/2 (ONE & ONE-HALF) TABS IN THE EVENING (Patient taking differently: 2 times daily) 225 tablet 0    traMADol (ULTRAM) 50 MG tablet Take 50 mg by mouth nightly.      DIPHENOXYLATE-ATROPINE PO Take 2.5 mg by mouth 4 times daily For Diarrhea/Colitis      irbesartan (  AVAPRO) 300 MG tablet Take 75 mg by mouth daily      budesonide (ENTOCORT EC) 3 MG extended release capsule Take 6 mg by mouth every morning      cholestyramine (QUESTRAN) 4 g packet Take 1 packet by mouth 2 times daily       Menthol, Topical Analgesic, (BIOFREEZE COLORLESS EX) Apply topically as needed       tiZANidine (ZANAFLEX) 4 MG tablet Take 4 mg by mouth 2 times daily       Multiple Vitamins-Iron (TAB-A-VITE/IRON) TABS Take 1 tablet by mouth daily       amitriptyline (ELAVIL) 10 MG tablet Take 100 mg by mouth nightly      SYNTHROID 112 MCG tablet Take 112 mcg by mouth daily       vitamin D (CHOLECALCIFEROL) 1000 UNIT TABS tablet Take 5,000 Units by mouth daily      aspirin 81 MG chewable tablet Take 81 mg by mouth daily Patient stopped per Dr. Christiana Fuchs 10 days prior to surgery (Patient not taking: Reported on 02/19/2021)      cetirizine (ZYRTEC) 10 MG tablet Take 10 mg by mouth daily  (Patient not taking: Reported on 02/19/2021)       No current facility-administered medications for this visit.        Allergies:  Iodine, Adhesive tape, Ciprofloxacin, Oxaprozin, Gabapentin, and Influenza vaccines    Social History:  Social History     Socioeconomic History    Marital status: Divorced     Spouse name: Not on file    Number of children: Not on file     Years of education: Not on file    Highest education level: Not on file   Occupational History    Not on file   Tobacco Use    Smoking status: Former     Packs/day: 0.25     Years: 1.00     Pack years: 0.25     Types: Cigarettes     Quit date: 01/25/1978     Years since quitting: 43.0    Smokeless tobacco: Never   Vaping Use    Vaping Use: Never used   Substance and Sexual Activity    Alcohol use: Not Currently     Comment: Socially    Drug use: No    Sexual activity: Not on file   Other Topics Concern    Not on file   Social History Narrative    Not on file     Social Determinants of Health     Financial Resource Strain: Not on file   Food Insecurity: Not on file   Transportation Needs: Not on file   Physical Activity: Not on file   Stress: Not on file   Social Connections: Not on file   Intimate Partner Violence: Not on file   Housing Stability: Not on file        Family History:   Family History   Problem Relation Age of Onset    Other Brother         allergies    Brain Cancer Brother     High Blood Pressure Mother     Stroke Mother     Heart Disease Father     High Blood Pressure Sister     Other Sister         allergies    Mult Sclerosis Sister           REVIEW OF SYSTEMS:  Review of Systems  Constitutional:  Negative for fever and unexpected weight change.   HENT:  Negative for hearing loss, tinnitus and trouble swallowing.    Eyes:  Negative for photophobia and visual disturbance.   Respiratory:  Negative for choking, chest tightness and shortness of breath.    Cardiovascular:  Negative for chest pain and palpitations.   Gastrointestinal:  Negative for abdominal pain and nausea.   Endocrine: Negative for cold intolerance and heat intolerance.   Genitourinary:  Negative for difficulty urinating, dyspareunia and urgency.   Musculoskeletal:  Positive for arthralgias and myalgias. Negative for gait problem.   Neurological:  Positive for headaches. Negative for dizziness, seizures and weakness.    Psychiatric/Behavioral:  Negative for confusion and dysphoric mood. The patient is not nervous/anxious.       PHYSICAL EXAM:  Vitals:  BP 123/84 (Site: Right Upper Arm, Position: Sitting, Cuff Size: Medium Adult)    Pulse 88    Temp 97.5 ??F (36.4 ??C) (Infrared)    Ht 5\' 5"  (1.651 m)    Wt 159 lb (72.1 kg)    BMI 26.46 kg/m??     General: The patient was well developed, in no acute distress.  HEENT: Normocephalic, atraumatic. Conjunctiva, lids, pupils, and irises clear. Oral mucosa is moist.  Neck:  No carotid bruits.   No nuchal rigidity or neck tenderness to palpation.  Abdomen: Soft, non-tender, non-distended.  Positive bowel sounds.  Skin (Restricted to face and distal upper and lower extremities): Unremarkable.  Musculoskeletal: No tenderness observed.     Neurological Examination:  Mental Status: Patient is currently awake, alert, and oriented to person, place, and time.  Able to state the reason for today's visit and can recall events leading up to this visit.  Speech is fluent without any evidence of dysphasia.     Cranial Nerves:  II Optic: Pupils equal and reactive.  Visual fields full.  III Oculomotor, IV Trochlear, VI Abducens:  Extraocular movements intact.  No nystagmus.  No gaze palsy or paresis.  No ptosis.  V Trigeminal: Facial sensation normal and symmetric in V1-V3 distribution.  VII Facial: Facial strength normal and symmetric.  VIII Vestibulocochlear: Hearing intact bilaterally.  IX Glossopharyngeal / X Vagus: Palate elevates symmetrically and uvula midline.  XI Accessory: Shoulder shrug symmetric.   XII Hypoglossal: Tongue midline with normal bilateral strength.     Motor Examination: Full 5/5 strength in bilateral upper and lower extremities to confrontation.      Sensory Examination: Sensation intact to light touch     Gait Examination: No difficulty standing from sitting position.  Steady and symmetrical gait            Electronically signed by Renold Don, MD on 02/19/21 at 11:44 AM  EDT

## 2021-02-20 ENCOUNTER — Other Ambulatory Visit: Payer: Self-pay

## 2021-02-20 ENCOUNTER — Inpatient Hospital Stay: Payer: BC Managed Care – PPO

## 2021-02-20 VITALS — BP 127/82 | HR 84 | Temp 97.0°F | Resp 18

## 2021-02-20 DIAGNOSIS — D508 Other iron deficiency anemias: Secondary | ICD-10-CM | POA: Diagnosis not present

## 2021-02-20 MED ORDER — SODIUM CHLORIDE 0.9 % IV SOLN: 200.0000 mg | Freq: Once | INTRAVENOUS | Status: DC

## 2021-02-20 MED ORDER — SODIUM CHLORIDE 0.9 % IV SOLN
Freq: Once | INTRAVENOUS | Status: AC
  Filled 2021-02-20: qty 250

## 2021-02-20 MED ORDER — IRON SUCROSE 20 MG/ML IV SOLN
200.0000 mg | Freq: Once | INTRAVENOUS | Status: AC
  Administered 2021-02-20: 200 mg via INTRAVENOUS
  Filled 2021-02-20: qty 10

## 2021-02-27 ENCOUNTER — Other Ambulatory Visit: Payer: Self-pay | Admitting: Nurse Practitioner

## 2021-03-03 ENCOUNTER — Ambulatory Visit: Payer: BC Managed Care – PPO | Admitting: Nurse Practitioner

## 2021-03-17 ENCOUNTER — Ambulatory Visit: Payer: BC Managed Care – PPO | Admitting: Nurse Practitioner

## 2021-03-21 ENCOUNTER — Other Ambulatory Visit: Payer: Self-pay | Admitting: Nurse Practitioner

## 2021-03-21 DIAGNOSIS — R110 Nausea: Secondary | ICD-10-CM

## 2021-03-23 ENCOUNTER — Encounter: Payer: Self-pay | Admitting: Nurse Practitioner

## 2021-03-23 ENCOUNTER — Ambulatory Visit (INDEPENDENT_AMBULATORY_CARE_PROVIDER_SITE_OTHER): Payer: BC Managed Care – PPO | Admitting: Nurse Practitioner

## 2021-03-23 ENCOUNTER — Other Ambulatory Visit: Payer: Self-pay

## 2021-03-23 VITALS — BP 140/61 | HR 70 | Temp 98.0°F | Resp 16 | Ht 64.0 in | Wt 174.0 lb

## 2021-03-23 DIAGNOSIS — E1165 Type 2 diabetes mellitus with hyperglycemia: Secondary | ICD-10-CM

## 2021-03-23 DIAGNOSIS — Z794 Long term (current) use of insulin: Secondary | ICD-10-CM

## 2021-03-23 DIAGNOSIS — I10 Essential (primary) hypertension: Secondary | ICD-10-CM | POA: Diagnosis not present

## 2021-03-23 DIAGNOSIS — R110 Nausea: Secondary | ICD-10-CM | POA: Diagnosis not present

## 2021-03-23 LAB — POCT GLYCOSYLATED HEMOGLOBIN (HGB A1C): Hemoglobin A1C: 6.3 % — AB (ref 4.0–5.6)

## 2021-03-23 MED ORDER — TIRZEPATIDE 5 MG/0.5ML ~~LOC~~ SOAJ: 5.0000 mg | SUBCUTANEOUS | 1 refills | Status: DC

## 2021-03-23 MED ORDER — ONDANSETRON HCL 8 MG PO TABS: ORAL_TABLET | ORAL | 2 refills | Status: DC

## 2021-03-23 NOTE — Progress Notes (Signed)
[facility] [place], [place] [address]  Internal MEDICINE  Office Visit Note  Patient Name: Chelsea Rivera  [id number]  [id number]  Date of Service: 03/23/2021  Chief Complaint  Patient presents with   Follow-up    Wants to change trulicity, discuss meds    Diabetes   Gastroesophageal Reflux   Hyperlipidemia   Hypertension   Anemia    HPI Chelsea Rivera presents for a follow up visit for diabetes and medication review. She is due for her A1C to be checked. Her A1c is 6.3 today which is improved from 6.6 in august. She uses the omnipod portable insulin pump and gets her prescribed basal and sliding scale insulin this way. She has also been on trulicity weekly injections but is not happy with this medication. She has gained 16 lbs this year. She is requesting to switch to ozempic or mounjaro. Her blood pressure is slightly elevated, she reports she is going back to work after being off for 10 days which is making her blood pressure run up.     Current Medication: Outpatient Encounter Medications as of 03/23/2021  Medication Sig   aspirin EC 81 MG tablet Take 81 mg by mouth daily.   atorvastatin (LIPITOR) 10 MG tablet TAKE 1/2 TABLET EVERYDAY FOR HIGH CHOLESTEROL   bisoprolol (ZEBETA) 5 MG tablet TAKE 1 TABLET BY MOUTH AT BEDTIME   Continuous Blood Gluc Receiver (DEXCOM G6 RECEIVER) DEVI Use as directed for continuous glucose monitoring - E11.65   Continuous Blood Gluc Sensor (DEXCOM G6 SENSOR) MISC To use as directed with Dexcom sensor for continuous glucose monitoring.   Continuous Blood Gluc Transmit (DEXCOM G6 TRANSMITTER) MISC Use as directed for continuous glucose monitoring  - E11.65   COVID-19 mRNA Vac-TriS, Pfizer, (PFIZER-BIONT COVID-19 VAC-TRIS) SUSP injection Inject into the muscle.   cyclobenzaprine (FLEXERIL) 10 MG tablet Take 1 tablet (10 mg total) by mouth at bedtime.   insulin aspart (NOVOLOG) 100 UNIT/ML injection Use as  directed with insulin  pump with omnipod  and sliding scale maximum 50 units a day   Insulin Pen Needle (BD PEN NEEDLE NANO U/F) 32G X 4 MM MISC Use as directed   LINZESS 290 MCG CAPS capsule TAKE 1 CAPSULE BY MOUTH EVERY DAY BEFOREBREAKFAST   metFORMIN (GLUCOPHAGE) 1000 MG tablet Take 1 tablet (1,000 mg total) by mouth 2 (two) times daily.   omeprazole (PRILOSEC) 40 MG capsule TAKE 1 CAPSULE BY MOUTH ONCE DAILY   ONETOUCH ULTRA test strip TEST BLOOD SUGAR 3 TIMES DAILY AS DIRECTED   patiromer (VELTASSA) 8.4 g packet    [START ON 04/13/2021] tirzepatide (MOUNJARO) 5 MG/0.5ML Pen Inject 5 mg into the skin once a week.   zolpidem (AMBIEN) 10 MG tablet Take 1 tablet (10 mg total) by mouth at bedtime as needed.   [DISCONTINUED] ondansetron (ZOFRAN) 8 MG tablet Take 1 tab po twice a daily as needed for nausea   [DISCONTINUED] TRULICITY 1.5 MV/7.8IO SOPN INJECT 1.5 MG ONCE EVERY 7 DAYS   ondansetron (ZOFRAN) 8 MG tablet Take 1 tab po twice a daily as needed for nausea   No facility-administered encounter medications on file as of 03/23/2021.    Surgical History: Past Surgical History:  Procedure Laterality Date   BREAST BIOPSY Right 2012   cleaned out fistula, not a biopsy   CHOLECYSTECTOMY     COLONOSCOPY WITH PROPOFOL N/A 07/07/2018   Procedure: COLONOSCOPY WITH PROPOFOL;  Surgeon: Lollie Sails, MD;  Location: [facility] ENDOSCOPY;  Service: Endoscopy;  Laterality: N/A;   SHOULDER ARTHROSCOPY      Medical History: Past Medical History:  Diagnosis Date   Diabetes mellitus without complication (HCC)    GERD (gastroesophageal reflux disease)    Hyperlipidemia    Hypertension    IDA (iron deficiency anemia) 01/27/2021    Family History: Family History  Problem Relation Age of Onset   Lung cancer Mother    Colon cancer Father    Breast cancer Neg Hx     Social History   Socioeconomic History   Marital status: Married    Spouse name: Not on file   Number of children: Not on file   Years of education:  Not on file   Highest education level: Not on file  Occupational History   Not on file  Tobacco Use   Smoking status: Never   Smokeless tobacco: Never  Vaping Use   Vaping Use: Never used  Substance and Sexual Activity   Alcohol use: Yes    Comment: socially   Drug use: No   Sexual activity: Not on file  Other Topics Concern   Not on file  Social History Narrative   Not on file   Social Determinants of Health   Financial Resource Strain: Not on file  Food Insecurity: Not on file  Transportation Needs: Not on file  Physical Activity: Not on file  Stress: Not on file  Social Connections: Not on file  Intimate Partner Violence: Not on file      Review of Systems  Constitutional:  Positive for unexpected weight change (gained 16 lbs this year.). Negative for chills and fatigue.  HENT:  Negative for congestion, rhinorrhea, sneezing and sore throat.   Eyes:  Negative for redness.  Respiratory:  Negative for cough, chest tightness and shortness of breath.   Cardiovascular:  Negative for chest pain and palpitations.  Gastrointestinal:  Negative for abdominal pain, constipation, diarrhea, nausea and vomiting.  Genitourinary:  Negative for dysuria and frequency.  Musculoskeletal:  Negative for arthralgias, back pain, joint swelling and neck pain.  Skin:  Negative for rash.  Neurological: Negative.  Negative for tremors and numbness.  Hematological:  Negative for adenopathy. Does not bruise/bleed easily.  Psychiatric/Behavioral:  Negative for behavioral problems (Depression), sleep disturbance and suicidal ideas. The patient is not nervous/anxious.    Vital Signs: BP 140/61   Pulse 70   Temp 98 F (36.7 C)   Resp 16   Ht 5\' 4"  (1.626 m)   Wt 174 lb (78.9 kg)   SpO2 99%   BMI 29.87 kg/m    Physical Exam Vitals reviewed.  Constitutional:      General: She is awake. She is not in acute distress.    Appearance: Normal appearance. She is well-developed, well-groomed and  overweight. She is not ill-appearing.  HENT:     Head: Normocephalic and atraumatic.  Eyes:     Pupils: Pupils are equal, round, and reactive to light.  Cardiovascular:     Rate and Rhythm: Normal rate and regular rhythm.  Pulmonary:     Effort: Pulmonary effort is normal. No respiratory distress.  Neurological:     Mental Status: She is alert and oriented to person, place, and time.     Cranial Nerves: No cranial nerve deficit.     Coordination: Coordination normal.     Gait: Gait normal.  Psychiatric:        Mood and Affect: Mood normal.  Behavior: Behavior normal. Behavior is cooperative.       Assessment/Plan: 1. Type 2 diabetes mellitus with hyperglycemia, with long-term current use of insulin (HCC) A1C continues to improve. Patient wants to try mounjaro instead of trulicity. Trulicity discontinued. 4 weeks of samples provided to patient for mounjaro. 2.5 mg. Prescription order sent to pharmacy for 5 mg dose of mounjaro to start after the first 4 weeks of 2.5 mg dose. Follow up in 8 weeks.  - POCT HgB A1C - tirzepatide (MOUNJARO) 5 MG/0.5ML Pen; Inject 5 mg into the skin once a week.  Dispense: 2 mL; Refill: 1  2. Essential hypertension Stable with current medications  3. Nausea Zofran reordered - ondansetron (ZOFRAN) 8 MG tablet; Take 1 tab po twice a daily as needed for nausea  Dispense: 60 tablet; Refill: 2   General Counseling: akeylah hendel understanding of the findings of todays visit and agrees with plan of treatment. I have discussed any further diagnostic evaluation that may be needed or ordered today. We also reviewed her medications today. she has been encouraged to call the office with any questions or concerns that should arise related to todays visit.    Orders Placed This Encounter  Procedures   POCT HgB A1C    Meds ordered this encounter  Medications   ondansetron (ZOFRAN) 8 MG tablet    Sig: Take 1 tab po twice a daily as needed for  nausea    Dispense:  60 tablet    Refill:  2   tirzepatide (MOUNJARO) 5 MG/0.5ML Pen    Sig: Inject 5 mg into the skin once a week.    Dispense:  2 mL    Refill:  1    Trulicity discontinued, this medication is in place of trulicity.    Return in about 8 weeks (around 05/18/2021) for F/U, eval new med, [place] PCP.   Total time spent:30 Minutes Time spent includes review of chart, medications, test results, and follow up plan with the patient.   [place] Controlled Substance Database was reviewed by me.  This patient was seen by Jonetta Osgood, FNP-C in collaboration with Dr. Clayborn Bigness as a part of collaborative care agreement.   Chea Malan R. Valetta Fuller, MSN, FNP-C Internal medicine

## 2021-03-27 ENCOUNTER — Other Ambulatory Visit: Payer: Self-pay

## 2021-03-27 DIAGNOSIS — D508 Other iron deficiency anemias: Secondary | ICD-10-CM

## 2021-03-30 ENCOUNTER — Other Ambulatory Visit: Payer: Self-pay

## 2021-03-30 ENCOUNTER — Inpatient Hospital Stay: Payer: BC Managed Care – PPO | Attending: Oncology

## 2021-03-30 DIAGNOSIS — Z79899 Other long term (current) drug therapy: Secondary | ICD-10-CM | POA: Diagnosis not present

## 2021-03-30 DIAGNOSIS — Z794 Long term (current) use of insulin: Secondary | ICD-10-CM | POA: Diagnosis not present

## 2021-03-30 DIAGNOSIS — N1832 Chronic kidney disease, stage 3b: Secondary | ICD-10-CM | POA: Diagnosis present

## 2021-03-30 DIAGNOSIS — Z7982 Long term (current) use of aspirin: Secondary | ICD-10-CM | POA: Diagnosis not present

## 2021-03-30 DIAGNOSIS — D631 Anemia in chronic kidney disease: Secondary | ICD-10-CM | POA: Insufficient documentation

## 2021-03-30 DIAGNOSIS — D508 Other iron deficiency anemias: Secondary | ICD-10-CM | POA: Insufficient documentation

## 2021-03-30 LAB — CBC WITH DIFFERENTIAL/PLATELET
Abs Immature Granulocytes: 0.01 10*3/uL (ref 0.00–0.07)
Basophils Absolute: 0 10*3/uL (ref 0.0–0.1)
Basophils Relative: 1 %
Eosinophils Absolute: 0.2 10*3/uL (ref 0.0–0.5)
Eosinophils Relative: 3 %
HCT: 37 % (ref 36.0–46.0)
Hemoglobin: 11.8 g/dL — ABNORMAL LOW (ref 12.0–15.0)
Immature Granulocytes: 0 %
Lymphocytes Relative: 34 %
Lymphs Abs: 2.1 10*3/uL (ref 0.7–4.0)
MCH: 28.2 pg (ref 26.0–34.0)
MCHC: 31.9 g/dL (ref 30.0–36.0)
MCV: 88.5 fL (ref 80.0–100.0)
Monocytes Absolute: 0.6 10*3/uL (ref 0.1–1.0)
Monocytes Relative: 9 %
Neutro Abs: 3.3 10*3/uL (ref 1.7–7.7)
Neutrophils Relative %: 53 %
Platelets: 266 10*3/uL (ref 150–400)
RBC: 4.18 MIL/uL (ref 3.87–5.11)
RDW: 16 % — ABNORMAL HIGH (ref 11.5–15.5)
WBC: 6.2 10*3/uL (ref 4.0–10.5)
nRBC: 0 % (ref 0.0–0.2)

## 2021-03-30 LAB — IRON AND TIBC
Iron: 62 ug/dL (ref 28–170)
Saturation Ratios: 19 % (ref 10.4–31.8)
TIBC: 329 ug/dL (ref 250–450)
UIBC: 267 ug/dL

## 2021-03-30 LAB — FERRITIN: Ferritin: 56 ng/mL (ref 11–307)

## 2021-04-06 ENCOUNTER — Other Ambulatory Visit: Payer: Self-pay

## 2021-04-06 ENCOUNTER — Encounter: Payer: Self-pay | Admitting: Oncology

## 2021-04-06 ENCOUNTER — Inpatient Hospital Stay: Payer: BC Managed Care – PPO

## 2021-04-06 ENCOUNTER — Other Ambulatory Visit: Payer: BC Managed Care – PPO

## 2021-04-06 ENCOUNTER — Inpatient Hospital Stay (HOSPITAL_BASED_OUTPATIENT_CLINIC_OR_DEPARTMENT_OTHER): Payer: BC Managed Care – PPO | Admitting: Oncology

## 2021-04-06 VITALS — BP 150/74 | HR 79 | Temp 96.9°F | Resp 17 | Wt 177.0 lb

## 2021-04-06 DIAGNOSIS — D508 Other iron deficiency anemias: Secondary | ICD-10-CM | POA: Diagnosis not present

## 2021-04-06 DIAGNOSIS — D631 Anemia in chronic kidney disease: Secondary | ICD-10-CM | POA: Diagnosis not present

## 2021-04-06 DIAGNOSIS — N1832 Chronic kidney disease, stage 3b: Secondary | ICD-10-CM | POA: Diagnosis not present

## 2021-04-06 MED ORDER — FERROUS SULFATE 325 (65 FE) MG PO TBEC: 325.0000 mg | DELAYED_RELEASE_TABLET | Freq: Every day | ORAL | 3 refills | Status: DC

## 2021-04-06 NOTE — Progress Notes (Signed)
Patient here for oncology follow-up appointment, concerns of right hand numbness at night

## 2021-04-06 NOTE — Progress Notes (Signed)
Hematology/Oncology Progress note  [phone] [phone]   Patient Care Team: Lavera Guise, MD as PCP - General (Internal Medicine)  REFERRING PROVIDER: Lavera Guise, MD  CHIEF COMPLAINTS/REASON FOR VISIT:  Follow up with anemia  HISTORY OF PRESENTING ILLNESS:  Chelsea Rivera is a  60 y.o.  female with PMH listed below who was referred to me for evaluation of anemia Reviewed patient's recent labs that was done.  12/26/2020 Labs revealed anemia with hemoglobin of 10.9.   Iron saturation was 8, ferritin was 8.   Reviewed patient's previous labs ordered by primary care physician's office, anemia is chronic onset , duration is since at least 2017 01/20/2021 she has blood work done at nephrologist office. Hb was 10.4  Associated signs and symptoms: Patient reports fatigue.  SOB with exertion.  Denies weight loss, easy bruising, hematochezia, hemoptysis, hematuria. Patient has history CKD, follows up with Dr.Kolluru.  01/20/2022 her creatinine level has improved.  She has diverticulosis and had colonoscopy done in 2020.  She reports that years ago she has had capsule endoscopy.  Denies any black or bloody stool, denies any abdominal pain.   INTERVAL HISTORY Chelsea Rivera is a 60 y.o. female who has above history reviewed by me today presents for follow up visit for anemia She received IV venofer treatments and tolerates well.  Fatigue level is unchanged.    Review of Systems  Constitutional:  Positive for fatigue. Negative for appetite change, chills and fever.  HENT:   Negative for hearing loss and voice change.   Eyes:  Negative for eye problems.  Respiratory:  Negative for chest tightness and cough.   Cardiovascular:  Negative for chest pain.  Gastrointestinal:  Negative for abdominal distention, abdominal pain and blood in stool.  Endocrine: Negative for hot flashes.  Genitourinary:  Negative for difficulty urinating and frequency.    Musculoskeletal:  Negative for arthralgias.  Skin:  Negative for itching and rash.  Neurological:  Negative for extremity weakness.  Hematological:  Negative for adenopathy.  Psychiatric/Behavioral:  Negative for confusion.     MEDICAL HISTORY:  Past Medical History:  Diagnosis Date   Diabetes mellitus without complication (HCC)    GERD (gastroesophageal reflux disease)    Hyperlipidemia    Hypertension    IDA (iron deficiency anemia) 01/27/2021    SURGICAL HISTORY: Past Surgical History:  Procedure Laterality Date   BREAST BIOPSY Right 2012   cleaned out fistula, not a biopsy   CHOLECYSTECTOMY     COLONOSCOPY WITH PROPOFOL N/A 07/07/2018   Procedure: COLONOSCOPY WITH PROPOFOL;  Surgeon: Lollie Sails, MD;  Location: [facility] ENDOSCOPY;  Service: Endoscopy;  Laterality: N/A;   SHOULDER ARTHROSCOPY      SOCIAL HISTORY: Social History   Socioeconomic History   Marital status: Married    Spouse name: Not on file   Number of children: Not on file   Years of education: Not on file   Highest education level: Not on file  Occupational History   Not on file  Tobacco Use   Smoking status: Never   Smokeless tobacco: Never  Vaping Use   Vaping Use: Never used  Substance and Sexual Activity   Alcohol use: Yes    Comment: socially   Drug use: No   Sexual activity: Not on file  Other Topics Concern   Not on file  Social History Narrative   Not on file   Social Determinants of Health   Financial Resource Strain: Not on  file  Food Insecurity: Not on file  Transportation Needs: Not on file  Physical Activity: Not on file  Stress: Not on file  Social Connections: Not on file  Intimate Partner Violence: Not on file    FAMILY HISTORY: Family History  Problem Relation Age of Onset   Lung cancer Mother    Colon cancer Father    Breast cancer Neg Hx     ALLERGIES:  has No Known Allergies.  MEDICATIONS:  Current Outpatient Medications  Medication Sig Dispense  Refill   aspirin EC 81 MG tablet Take 81 mg by mouth daily.     atorvastatin (LIPITOR) 10 MG tablet TAKE 1/2 TABLET EVERYDAY FOR HIGH CHOLESTEROL 45 tablet 5   bisoprolol (ZEBETA) 5 MG tablet TAKE 1 TABLET BY MOUTH AT BEDTIME 30 tablet 5   Continuous Blood Gluc Receiver (DEXCOM G6 RECEIVER) DEVI Use as directed for continuous glucose monitoring - E11.65 3 each 3   Continuous Blood Gluc Sensor (DEXCOM G6 SENSOR) MISC To use as directed with Dexcom sensor for continuous glucose monitoring. 3 each 3   Continuous Blood Gluc Transmit (DEXCOM G6 TRANSMITTER) MISC Use as directed for continuous glucose monitoring  - E11.65 1 each 3   COVID-19 mRNA Vac-TriS, Pfizer, (PFIZER-BIONT COVID-19 VAC-TRIS) SUSP injection Inject into the muscle. 0.3 mL 0   cyclobenzaprine (FLEXERIL) 10 MG tablet Take 1 tablet (10 mg total) by mouth at bedtime. 30 tablet 1   ferrous sulfate 325 (65 FE) MG EC tablet Take 1 tablet (325 mg total) by mouth daily. 30 tablet 3   insulin aspart (NOVOLOG) 100 UNIT/ML injection Use as  directed with insulin pump with omnipod  and sliding scale maximum 50 units a day 20 mL 3   Insulin Pen Needle (BD PEN NEEDLE NANO U/F) 32G X 4 MM MISC Use as directed 100 each 1   LINZESS 290 MCG CAPS capsule TAKE 1 CAPSULE BY MOUTH EVERY DAY BEFOREBREAKFAST 90 capsule 3   metFORMIN (GLUCOPHAGE) 1000 MG tablet Take 1 tablet (1,000 mg total) by mouth 2 (two) times daily. 180 tablet 3   omeprazole (PRILOSEC) 40 MG capsule TAKE 1 CAPSULE BY MOUTH ONCE DAILY 30 capsule 5   ondansetron (ZOFRAN) 8 MG tablet Take 1 tab po twice a daily as needed for nausea 60 tablet 2   ONETOUCH ULTRA test strip TEST BLOOD SUGAR 3 TIMES DAILY AS DIRECTED 100 each 1   patiromer (VELTASSA) 8.4 g packet      [START ON 04/13/2021] tirzepatide (MOUNJARO) 5 MG/0.5ML Pen Inject 5 mg into the skin once a week. 2 mL 1   zolpidem (AMBIEN) 10 MG tablet Take 1 tablet (10 mg total) by mouth at bedtime as needed. 30 tablet 3   No current  facility-administered medications for this visit.     PHYSICAL EXAMINATION: ECOG PERFORMANCE STATUS: 0 - Asymptomatic Vitals:   04/06/21 1418  BP: (!) 150/74  Pulse: 79  Resp: 17  Temp: (!) 96.9 F (36.1 C)  SpO2: 99%   Filed Weights   04/06/21 1418  Weight: 177 lb (80.3 kg)    Physical Exam Constitutional:      General: She is not in acute distress. HENT:     Head: Normocephalic and atraumatic.  Eyes:     General: No scleral icterus. Cardiovascular:     Rate and Rhythm: Normal rate and regular rhythm.     Heart sounds: Normal heart sounds.  Pulmonary:     Effort: Pulmonary effort is normal. No respiratory  distress.     Breath sounds: No wheezing.  Abdominal:     General: Bowel sounds are normal. There is no distension.     Palpations: Abdomen is soft.  Musculoskeletal:        General: No deformity. Normal range of motion.     Cervical back: Normal range of motion and neck supple.  Skin:    General: Skin is warm and dry.     Findings: No erythema or rash.  Neurological:     Mental Status: She is alert and oriented to person, place, and time. Mental status is at baseline.     Cranial Nerves: No cranial nerve deficit.     Coordination: Coordination normal.  Psychiatric:        Mood and Affect: Mood normal.     LABORATORY DATA:  I have reviewed the data as listed Lab Results  Component Value Date   WBC 6.2 03/30/2021   HGB 11.8 (L) 03/30/2021   HCT 37.0 03/30/2021   MCV 88.5 03/30/2021   PLT 266 03/30/2021   Recent Labs    12/26/20 1622 12/26/20 1623  NA 140 140  K 4.9 4.9  CL 103 102  CO2 19* 16*  GLUCOSE 150* 151*  BUN 31* 30*  CREATININE 1.29* 1.35*  CALCIUM 10.0 10.0  PROT 7.2 7.0  ALBUMIN 4.5 4.4  AST 19 20  ALT 18 19  ALKPHOS 89 84  BILITOT 0.2 <0.2    Iron/TIBC/Ferritin/ %Sat    Component Value Date/Time   IRON 62 03/30/2021 1337   IRON 31 12/26/2020 1622   TIBC 329 03/30/2021 1337   TIBC 395 12/26/2020 1622   FERRITIN 56  03/30/2021 1337   FERRITIN 8 (L) 12/26/2020 1622   IRONPCTSAT 19 03/30/2021 1337   IRONPCTSAT 8 (LL) 12/26/2020 1622         ASSESSMENT & PLAN:  1. Other iron deficiency anemia   2. Anemia due to stage 3b chronic kidney disease (HCC)    Anemia is likely a combination of iron deficiency and CKD. SPEP was checked by nephrologist previously and was negative for M protien.  S/p Venofer 200mg  weekly x 4 doses.  Labs are reviewed and discussed with patient. Iron panel has improved. Hold off additional IV venofer.  Recommend trials of oral ferrous sulfate 325mg  daily.  I ask her to call office if she is not able to tolerate.  Refer her to re-establish care with GI for work up of IDA  Anemia of CKD, Hb is above 10.   Follow up in 4 months. lab MD +/- Venofer     All questions were answered. The patient knows to call the clinic with any problems questions or concerns. CcLavera Guise, MD     Earlie Server, MD, PhD 04/06/2021

## 2021-04-10 ENCOUNTER — Telehealth: Payer: Self-pay

## 2021-04-10 NOTE — Telephone Encounter (Signed)
Faxed [place] med rx for [employer]

## 2021-04-16 ENCOUNTER — Encounter: Payer: Self-pay | Admitting: Nurse Practitioner

## 2021-04-25 ENCOUNTER — Telehealth: Payer: Self-pay

## 2021-04-25 DIAGNOSIS — E1165 Type 2 diabetes mellitus with hyperglycemia: Secondary | ICD-10-CM

## 2021-04-25 MED ORDER — TIRZEPATIDE 5 MG/0.5ML ~~LOC~~ SOAJ: 5.0000 mg | SUBCUTANEOUS | 1 refills | Status: DC

## 2021-04-25 NOTE — Telephone Encounter (Signed)
PA for [facility] 5mg /0.5 ml was sent 04/25/21 @ 105pm and came back approved valid from 04/25/21 to 04/25/22,  sent new rx to pharmacy and sent pt a mychart message

## 2021-05-19 ENCOUNTER — Ambulatory Visit: Payer: BC Managed Care – PPO | Admitting: Nurse Practitioner

## 2021-05-22 ENCOUNTER — Other Ambulatory Visit: Payer: Self-pay | Admitting: Internal Medicine

## 2021-05-26 ENCOUNTER — Other Ambulatory Visit: Payer: Self-pay

## 2021-05-26 ENCOUNTER — Encounter: Payer: Self-pay | Admitting: Nurse Practitioner

## 2021-05-26 ENCOUNTER — Ambulatory Visit (INDEPENDENT_AMBULATORY_CARE_PROVIDER_SITE_OTHER): Payer: BC Managed Care – PPO | Admitting: Nurse Practitioner

## 2021-05-26 VITALS — BP 136/68 | HR 90 | Temp 98.2°F | Resp 16 | Ht 64.0 in | Wt 175.2 lb

## 2021-05-26 DIAGNOSIS — I10 Essential (primary) hypertension: Secondary | ICD-10-CM

## 2021-05-26 DIAGNOSIS — Z1231 Encounter for screening mammogram for malignant neoplasm of breast: Secondary | ICD-10-CM

## 2021-05-26 DIAGNOSIS — E875 Hyperkalemia: Secondary | ICD-10-CM

## 2021-05-26 DIAGNOSIS — Z794 Long term (current) use of insulin: Secondary | ICD-10-CM

## 2021-05-26 DIAGNOSIS — E1122 Type 2 diabetes mellitus with diabetic chronic kidney disease: Secondary | ICD-10-CM | POA: Diagnosis not present

## 2021-05-26 DIAGNOSIS — D508 Other iron deficiency anemias: Secondary | ICD-10-CM

## 2021-05-26 DIAGNOSIS — E782 Mixed hyperlipidemia: Secondary | ICD-10-CM | POA: Diagnosis not present

## 2021-05-26 DIAGNOSIS — B372 Candidiasis of skin and nail: Secondary | ICD-10-CM

## 2021-05-26 DIAGNOSIS — N182 Chronic kidney disease, stage 2 (mild): Secondary | ICD-10-CM | POA: Diagnosis not present

## 2021-05-26 DIAGNOSIS — R300 Dysuria: Secondary | ICD-10-CM

## 2021-05-26 DIAGNOSIS — Z0001 Encounter for general adult medical examination with abnormal findings: Secondary | ICD-10-CM | POA: Diagnosis not present

## 2021-05-26 LAB — POCT GLYCOSYLATED HEMOGLOBIN (HGB A1C): Hemoglobin A1C: 6.2 % — AB (ref 4.0–5.6)

## 2021-05-26 MED ORDER — TIRZEPATIDE 7.5 MG/0.5ML ~~LOC~~ SOAJ: 7.5000 mg | SUBCUTANEOUS | 1 refills | Status: DC

## 2021-05-26 MED ORDER — CLOTRIMAZOLE-BETAMETHASONE 1-0.05 % EX CREA: 1.0000 | TOPICAL_CREAM | Freq: Every day | CUTANEOUS | 1 refills | Status: DC

## 2021-05-26 NOTE — Progress Notes (Signed)
[facility] [place], [place] [address]  Internal MEDICINE  Office Visit Note  Patient Name: Chelsea Rivera  [id number]  [id number]  Date of Service: 05/26/2021  Chief Complaint  Patient presents with   Annual Exam    Discuss meds   Diabetes   Gastroesophageal Reflux   Hypertension   Hyperlipidemia   Anemia    HPI Chelsea Rivera presents for an annual well visit and physical exam. She is a well appearing 61 yo female with type 2 diabetes and hypertension. She plans to see her eye doctor for diabetic eye exam in February. She had a screening colonoscopy in 2020. She is scheduled for another colonoscopy in march this year due to iron deficiency anemia.  She is followed by Dr. Tasia Catchings as well for iron deficiency anemia and was started on oral iron supplement recently. She will be getting another set of labs drawn in march.  She uses the omnipod portable insulin pump and gets her prescribed basal and sliding scale insulin this way. She has lost 2 lbs since her previous office visit. She is currently on mounjaro 5 mg weekly. She is interested in increasing the dose to 7.5 mg weekly. She has had to titrate her insulin down a little.  She is not due to check her A1C for another month. She is requesting to check it today and is aware of the $25 fee since her insurance will not cover it being done again so soon. Her A1C was slightly improved at 6.2 today.  Her blood pressure is better today than it was at her previous office visit. She continues to take bisoprolol to control her blood pressure.  She is due for a screening mammogram. Pap smear is not due until 2024.     Current Medication: Outpatient Encounter Medications as of 05/26/2021  Medication Sig   aspirin EC 81 MG tablet Take 81 mg by mouth daily.   atorvastatin (LIPITOR) 10 MG tablet TAKE 1/2 TABLET EVERYDAY FOR HIGH CHOLESTEROL   bisoprolol (ZEBETA) 5 MG tablet TAKE 1 TABLET BY MOUTH AT BEDTIME    clotrimazole-betamethasone (LOTRISONE) cream Apply 1 application topically daily.   Continuous Blood Gluc Receiver (DEXCOM G6 RECEIVER) DEVI Use as directed for continuous glucose monitoring - E11.65   Continuous Blood Gluc Sensor (DEXCOM G6 SENSOR) MISC To use as directed with Dexcom sensor for continuous glucose monitoring.   Continuous Blood Gluc Transmit (DEXCOM G6 TRANSMITTER) MISC Use as directed for continuous glucose monitoring  - E11.65   COVID-19 mRNA Vac-TriS, Pfizer, (PFIZER-BIONT COVID-19 VAC-TRIS) SUSP injection Inject into the muscle.   cyclobenzaprine (FLEXERIL) 10 MG tablet Take 1 tablet (10 mg total) by mouth at bedtime.   ferrous sulfate 325 (65 FE) MG EC tablet Take 1 tablet (325 mg total) by mouth daily.   Insulin Pen Needle (BD PEN NEEDLE NANO U/F) 32G X 4 MM MISC Use as directed   LINZESS 290 MCG CAPS capsule TAKE 1 CAPSULE BY MOUTH EVERY DAY BEFOREBREAKFAST   metFORMIN (GLUCOPHAGE) 1000 MG tablet Take 1 tablet (1,000 mg total) by mouth 2 (two) times daily.   NOVOLOG 100 UNIT/ML injection USE AS DIRECTED WITH INSULIN PUMP WITH OMNIPOD AND SLIDING SCALE **MAXIMUM OF 50 UNITS PER DAY**   omeprazole (PRILOSEC) 40 MG capsule TAKE 1 CAPSULE BY MOUTH ONCE DAILY   ondansetron (ZOFRAN) 8 MG tablet Take 1 tab po twice a daily as needed for nausea   ONETOUCH ULTRA test strip TEST BLOOD SUGAR 3 TIMES DAILY  AS DIRECTED   patiromer (VELTASSA) 8.4 g packet    tirzepatide (MOUNJARO) 7.5 MG/0.5ML Pen Inject 7.5 mg into the skin once a week.   zolpidem (AMBIEN) 10 MG tablet Take 1 tablet (10 mg total) by mouth at bedtime as needed.   [DISCONTINUED] tirzepatide [facility]) 5 MG/0.5ML Pen Inject 5 mg into the skin once a week.   No facility-administered encounter medications on file as of 05/26/2021.    Surgical History: Past Surgical History:  Procedure Laterality Date   BREAST BIOPSY Right 2012   cleaned out fistula, not a biopsy   CHOLECYSTECTOMY     COLONOSCOPY WITH PROPOFOL N/A  07/07/2018   Procedure: COLONOSCOPY WITH PROPOFOL;  Surgeon: Lollie Sails, MD;  Location: [facility] ENDOSCOPY;  Service: Endoscopy;  Laterality: N/A;   SHOULDER ARTHROSCOPY      Medical History: Past Medical History:  Diagnosis Date   Diabetes mellitus without complication (HCC)    GERD (gastroesophageal reflux disease)    Hyperlipidemia    Hypertension    IDA (iron deficiency anemia) 01/27/2021    Family History: Family History  Problem Relation Age of Onset   Lung cancer Mother    Colon cancer Father    Breast cancer Neg Hx     Social History   Socioeconomic History   Marital status: Married    Spouse name: Not on file   Number of children: Not on file   Years of education: Not on file   Highest education level: Not on file  Occupational History   Not on file  Tobacco Use   Smoking status: Never   Smokeless tobacco: Never  Vaping Use   Vaping Use: Never used  Substance and Sexual Activity   Alcohol use: Yes    Comment: socially   Drug use: No   Sexual activity: Not on file  Other Topics Concern   Not on file  Social History Narrative   Not on file   Social Determinants of Health   Financial Resource Strain: Not on file  Food Insecurity: Not on file  Transportation Needs: Not on file  Physical Activity: Not on file  Stress: Not on file  Social Connections: Not on file  Intimate Partner Violence: Not on file      Review of Systems  Constitutional:  Negative for activity change, appetite change, chills, fatigue, fever and unexpected weight change.  HENT: Negative.  Negative for congestion, ear pain, rhinorrhea, sore throat and trouble swallowing.   Eyes: Negative.   Respiratory: Negative.  Negative for cough, chest tightness, shortness of breath and wheezing.   Cardiovascular: Negative.  Negative for chest pain.  Gastrointestinal: Negative.  Negative for abdominal pain, blood in stool, constipation, diarrhea, nausea and vomiting.  Endocrine: Negative.    Genitourinary: Negative.  Negative for difficulty urinating, dysuria, frequency, hematuria and urgency.  Musculoskeletal: Negative.  Negative for arthralgias, back pain, joint swelling, myalgias and neck pain.  Skin: Negative.  Negative for rash and wound.  Allergic/Immunologic: Negative.  Negative for immunocompromised state.  Neurological: Negative.  Negative for dizziness, seizures, numbness and headaches.  Hematological: Negative.   Psychiatric/Behavioral: Negative.  Negative for behavioral problems, self-injury and suicidal ideas. The patient is not nervous/anxious.    Vital Signs: BP 136/68    Pulse 90    Temp 98.2 F (36.8 C)    Resp 16    Ht 5\' 4"  (1.626 m)    Wt 175 lb 3.2 oz (79.5 kg)    SpO2 98%  BMI 30.07 kg/m    Physical Exam Vitals reviewed.  Constitutional:      General: She is awake. She is not in acute distress.    Appearance: Normal appearance. She is well-developed and well-groomed. She is obese. She is not ill-appearing or diaphoretic.  HENT:     Head: Normocephalic and atraumatic.     Right Ear: Tympanic membrane, ear canal and external ear normal.     Left Ear: Tympanic membrane, ear canal and external ear normal.     Nose: Nose normal.     Mouth/Throat:     Lips: Pink.     Mouth: Mucous membranes are moist.     Pharynx: Oropharynx is clear. Uvula midline. No oropharyngeal exudate or posterior oropharyngeal erythema.  Eyes:     General: Lids are normal. Vision grossly intact. Gaze aligned appropriately. No scleral icterus.       Right eye: No discharge.        Left eye: No discharge.     Conjunctiva/sclera: Conjunctivae normal.     Pupils: Pupils are equal, round, and reactive to light.     Funduscopic exam:    Right eye: Red reflex present.        Left eye: Red reflex present. Neck:     Thyroid: No thyromegaly.     Vascular: No carotid bruit or JVD.     Trachea: Trachea and phonation normal. No tracheal deviation.  Cardiovascular:     Rate and  Rhythm: Normal rate and regular rhythm.     Pulses: Normal pulses.     Heart sounds: Normal heart sounds, S1 normal and S2 normal. No murmur heard.   No friction rub. No gallop.  Pulmonary:     Effort: Pulmonary effort is normal. No accessory muscle usage or respiratory distress.     Breath sounds: Normal breath sounds and air entry. No stridor. No wheezing, rhonchi or rales.  Chest:     Chest wall: No tenderness.     Comments: Declined breast exam, will get mammogram Abdominal:     General: Bowel sounds are normal. There is no distension.     Palpations: Abdomen is soft. There is no shifting dullness, fluid wave, mass or pulsatile mass.     Tenderness: There is no abdominal tenderness. There is no guarding or rebound.  Musculoskeletal:        General: No tenderness or deformity. Normal range of motion.     Cervical back: Normal range of motion and neck supple.     Right lower leg: No edema.     Left lower leg: No edema.  Lymphadenopathy:     Cervical: No cervical adenopathy.  Skin:    General: Skin is warm and dry.     Capillary Refill: Capillary refill takes less than 2 seconds.     Coloration: Skin is not pale.     Findings: No erythema or rash.  Neurological:     Mental Status: She is alert and oriented to person, place, and time.     Cranial Nerves: No cranial nerve deficit.     Motor: No abnormal muscle tone.     Coordination: Coordination normal.     Gait: Gait normal.     Deep Tendon Reflexes: Reflexes are normal and symmetric.  Psychiatric:        Mood and Affect: Mood and affect normal.        Behavior: Behavior normal. Behavior is cooperative.        Thought Content:  Thought content normal.        Judgment: Judgment normal.       Assessment/Plan: 1. Encounter for general adult medical examination with abnormal findings Age-appropriate preventive screenings and vaccinations discussed, annual physical exam completed. Routine labs for health maintenance done prior  to office visit and routine ordered every couple of months via Dr. Tasia Catchings. Lipid panel will be repeated while fasting. PHM updated.   2. Type 2 diabetes mellitus with stage 2 chronic kidney disease, with long-term current use of insulin (HCC) A1c was 6.2 today, mounjaro dose increase per patient request. She is working on titrating her insulin down and also wants to increase the dose to help her lose weight. Follow up in 3 months, repeat A1C in 3 months. A1C was not due today, patient paid $25 for in office testing for A1C today.  - tirzepatide (MOUNJARO) 7.5 MG/0.5ML Pen; Inject 7.5 mg into the skin once a week.  Dispense: 6 mL; Refill: 1 - POCT glycosylated hemoglobin (Hb A1C)  3. CKD (chronic kidney disease) stage 2, GFR 60-89 ml/min GFR has improved to >60, she is stage 2 instead of stage 3a.   4. Benign hypertension Blood pressure is stable with bisoprolol.   5. Mixed hyperlipidemia Routine lab ordered. Was not fasting when her lipid panel was drawn in august. She is on atorvastatin  due to increase risk for cardiac events related to diabetes and CKD.  - Lipid Profile  6. Cutaneous candidiasis Developing possible yeast rash under breasts, topical ordered.  - clotrimazole-betamethasone (LOTRISONE) cream; Apply 1 application topically daily.  Dispense: 45 g; Refill: 1  7. Other iron deficiency anemia Monitored by Dr. Tasia Catchings, hematology. Will have colonoscopy done in march with gastroenterology due to [place]  8. Hyperkalemia Chronic elevated potassium due to CKD, monitored by nephrology, Dr. Juleen China.  9. Dysuria Routine urinalysis done - UA/M w/rflx Culture, Routine  10. Encounter for screening mammogram for malignant neoplasm of breast Screening mammogram ordered - MM 3D SCREEN BREAST BILATERAL; Future      General Counseling: brielyn bosak understanding of the findings of todays visit and agrees with plan of treatment. I have discussed any further diagnostic evaluation that may be  needed or ordered today. We also reviewed her medications today. she has been encouraged to call the office with any questions or concerns that should arise related to todays visit.    Orders Placed This Encounter  Procedures   MM 3D SCREEN BREAST BILATERAL   UA/M w/rflx Culture, Routine   Lipid Profile   POCT glycosylated hemoglobin (Hb A1C)    Meds ordered this encounter  Medications   tirzepatide (MOUNJARO) 7.5 MG/0.5ML Pen    Sig: Inject 7.5 mg into the skin once a week.    Dispense:  6 mL    Refill:  1    Note dose change, please discontinue 5 mg dose, patient is titrating up to the 7.5 mg dose.   clotrimazole-betamethasone (LOTRISONE) cream    Sig: Apply 1 application topically daily.    Dispense:  45 g    Refill:  1    Return in about 3 months (around 08/24/2021) for F/U, Recheck A1C, Malaika Arnall PCP.   Total time spent:30 Minutes Time spent includes review of chart, medications, test results, and follow up plan with the patient.   [place] Controlled Substance Database was reviewed by me.  This patient was seen by Jonetta Osgood, FNP-C in collaboration with Dr. Clayborn Bigness as a part of collaborative care agreement.  Cheyne Boulden R. Valetta Fuller, MSN, FNP-C Internal medicine

## 2021-05-27 LAB — UA/M W/RFLX CULTURE, ROUTINE
Bilirubin, UA: NEGATIVE
Ketones, UA: NEGATIVE
Leukocytes,UA: NEGATIVE
Nitrite, UA: NEGATIVE
Protein,UA: NEGATIVE
RBC, UA: NEGATIVE
Specific Gravity, UA: 1.019 (ref 1.005–1.030)
Urobilinogen, Ur: 0.2 mg/dL (ref 0.2–1.0)
pH, UA: 5.5 (ref 5.0–7.5)

## 2021-05-27 LAB — MICROSCOPIC EXAMINATION
Bacteria, UA: NONE SEEN
Casts: NONE SEEN /lpf
RBC, Urine: NONE SEEN /hpf (ref 0–2)
WBC, UA: NONE SEEN /hpf (ref 0–5)

## 2021-05-31 ENCOUNTER — Telehealth: Payer: Self-pay

## 2021-05-31 NOTE — Telephone Encounter (Signed)
PA for [facility] 7.5mg  sent 05/31/21 @ 442pm

## 2021-06-03 ENCOUNTER — Telehealth: Payer: Self-pay

## 2021-06-03 NOTE — Telephone Encounter (Signed)
PA for [facility] response on 06/03/2021  CaseId:[id number];Status:Cancelled;Explanation:PA not Required.The Prescribed limit is below the plan limit;

## 2021-06-05 ENCOUNTER — Encounter: Payer: Self-pay | Admitting: Nurse Practitioner

## 2021-06-05 DIAGNOSIS — N3000 Acute cystitis without hematuria: Secondary | ICD-10-CM

## 2021-06-05 DIAGNOSIS — F5101 Primary insomnia: Secondary | ICD-10-CM

## 2021-06-05 MED ORDER — ZOLPIDEM TARTRATE 10 MG PO TABS: 10.0000 mg | ORAL_TABLET | Freq: Every evening | ORAL | 2 refills | Status: DC | PRN

## 2021-06-05 MED ORDER — SULFAMETHOXAZOLE-TRIMETHOPRIM 800-160 MG PO TABS: 1.0000 | ORAL_TABLET | Freq: Two times a day (BID) | ORAL | 0 refills | Status: AC

## 2021-06-17 ENCOUNTER — Encounter: Payer: Self-pay | Admitting: Nurse Practitioner

## 2021-06-18 ENCOUNTER — Other Ambulatory Visit: Payer: Self-pay | Admitting: Internal Medicine

## 2021-06-19 ENCOUNTER — Other Ambulatory Visit: Payer: Self-pay

## 2021-06-19 ENCOUNTER — Other Ambulatory Visit: Payer: Self-pay | Admitting: Nurse Practitioner

## 2021-06-19 MED ORDER — MOUNJARO 10 MG/0.5ML ~~LOC~~ SOAJ: 10.0000 mg | SUBCUTANEOUS | 3 refills | Status: DC

## 2021-06-19 NOTE — Telephone Encounter (Signed)
Pt tolerating the Mounjaro 7.5 mg dose well and per Alyssa we sent in a new higher dose of 10 mg/0.78ml

## 2021-06-21 ENCOUNTER — Other Ambulatory Visit: Payer: Self-pay

## 2021-06-21 MED ORDER — MOUNJARO 7.5 MG/0.5ML ~~LOC~~ SOAJ: 7.5000 mg | SUBCUTANEOUS | 1 refills | Status: DC

## 2021-06-27 LAB — LIPID PANEL
Chol/HDL Ratio: 2.7 ratio (ref 0.0–4.4)
Cholesterol, Total: 163 mg/dL (ref 100–199)
HDL: 60 mg/dL (ref >39)
LDL Chol Calc (NIH): 89 mg/dL (ref 0–99)
Triglycerides: 73 mg/dL (ref 0–149)
VLDL Cholesterol Cal: 14 mg/dL (ref 5–40)

## 2021-07-21 ENCOUNTER — Other Ambulatory Visit: Payer: Self-pay | Admitting: Oncology

## 2021-07-21 ENCOUNTER — Other Ambulatory Visit: Payer: Self-pay

## 2021-07-22 ENCOUNTER — Other Ambulatory Visit: Payer: Self-pay | Admitting: *Deleted

## 2021-07-22 DIAGNOSIS — D508 Other iron deficiency anemias: Secondary | ICD-10-CM

## 2021-07-23 ENCOUNTER — Other Ambulatory Visit: Payer: Self-pay

## 2021-07-23 ENCOUNTER — Ambulatory Visit
Admission: RE | Admit: 2021-07-23 | Discharge: 2021-07-23 | Disposition: A | Discharge status: Home / Self Care | Payer: BC Managed Care – PPO | Source: Ambulatory Visit | Attending: Nurse Practitioner | Admitting: Nurse Practitioner

## 2021-07-23 DIAGNOSIS — Z1231 Encounter for screening mammogram for malignant neoplasm of breast: Secondary | ICD-10-CM | POA: Diagnosis present

## 2021-07-27 ENCOUNTER — Ambulatory Visit: Payer: BC Managed Care – PPO | Admitting: Certified Registered Nurse Anesthetist

## 2021-07-27 ENCOUNTER — Encounter: Payer: Self-pay | Admitting: Gastroenterology

## 2021-07-27 ENCOUNTER — Encounter
Admission: RE | Disposition: A | Discharge status: Home / Self Care | Payer: Self-pay | Source: Home / Self Care | Attending: Gastroenterology

## 2021-07-27 ENCOUNTER — Ambulatory Visit
Admission: RE | Admit: 2021-07-27 | Discharge: 2021-07-27 | Disposition: A | Discharge status: Home / Self Care | Payer: BC Managed Care – PPO | Attending: Gastroenterology | Admitting: Gastroenterology

## 2021-07-27 ENCOUNTER — Encounter: Payer: Self-pay | Admitting: Oncology

## 2021-07-27 DIAGNOSIS — D124 Benign neoplasm of descending colon: Secondary | ICD-10-CM | POA: Insufficient documentation

## 2021-07-27 DIAGNOSIS — K219 Gastro-esophageal reflux disease without esophagitis: Secondary | ICD-10-CM | POA: Insufficient documentation

## 2021-07-27 DIAGNOSIS — D120 Benign neoplasm of cecum: Secondary | ICD-10-CM | POA: Diagnosis not present

## 2021-07-27 DIAGNOSIS — Z800 Family history of malignant neoplasm of digestive organs: Secondary | ICD-10-CM | POA: Insufficient documentation

## 2021-07-27 DIAGNOSIS — K640 First degree hemorrhoids: Secondary | ICD-10-CM | POA: Insufficient documentation

## 2021-07-27 DIAGNOSIS — I10 Essential (primary) hypertension: Secondary | ICD-10-CM | POA: Insufficient documentation

## 2021-07-27 DIAGNOSIS — D509 Iron deficiency anemia, unspecified: Secondary | ICD-10-CM | POA: Insufficient documentation

## 2021-07-27 DIAGNOSIS — E119 Type 2 diabetes mellitus without complications: Secondary | ICD-10-CM | POA: Diagnosis not present

## 2021-07-27 HISTORY — PX: COLONOSCOPY WITH PROPOFOL: SHX5780

## 2021-07-27 HISTORY — PX: ESOPHAGOGASTRODUODENOSCOPY (EGD) WITH PROPOFOL: SHX5813

## 2021-07-27 LAB — GLUCOSE, CAPILLARY: Glucose-Capillary: 159 mg/dL — ABNORMAL HIGH (ref 70–99)

## 2021-07-27 SURGERY — COLONOSCOPY WITH PROPOFOL
Anesthesia: General

## 2021-07-27 MED ORDER — PROPOFOL 10 MG/ML IV BOLUS
INTRAVENOUS | Status: DC | PRN
  Administered 2021-07-27: 70 mg via INTRAVENOUS
  Administered 2021-07-27: 30 mg via INTRAVENOUS
  Administered 2021-07-27: 20 mg via INTRAVENOUS
  Administered 2021-07-27: 30 mg via INTRAVENOUS

## 2021-07-27 MED ORDER — DEXMEDETOMIDINE (PRECEDEX) IN NS 20 MCG/5ML (4 MCG/ML) IV SYRINGE
PREFILLED_SYRINGE | INTRAVENOUS | Status: DC | PRN
  Administered 2021-07-27: 8 ug via INTRAVENOUS

## 2021-07-27 MED ORDER — LIDOCAINE HCL (PF) 2 % IJ SOLN
INTRAMUSCULAR | Status: AC
Start: 2021-07-27 — End: ?
  Filled 2021-07-27: qty 5

## 2021-07-27 MED ORDER — PROPOFOL 500 MG/50ML IV EMUL
INTRAVENOUS | Status: AC
  Filled 2021-07-27: qty 50

## 2021-07-27 MED ORDER — SODIUM CHLORIDE 0.9 % IV SOLN
INTRAVENOUS | Status: DC
  Administered 2021-07-27: 20 mL/h via INTRAVENOUS

## 2021-07-27 MED ORDER — PHENYLEPHRINE 40 MCG/ML (10ML) SYRINGE FOR IV PUSH (FOR BLOOD PRESSURE SUPPORT)
PREFILLED_SYRINGE | INTRAVENOUS | Status: AC
  Filled 2021-07-27: qty 10

## 2021-07-27 MED ORDER — PROPOFOL 500 MG/50ML IV EMUL
INTRAVENOUS | Status: DC | PRN
  Administered 2021-07-27: 150 ug/kg/min via INTRAVENOUS

## 2021-07-27 MED ORDER — LIDOCAINE HCL (CARDIAC) PF 100 MG/5ML IV SOSY
PREFILLED_SYRINGE | INTRAVENOUS | Status: DC | PRN
  Administered 2021-07-27: 50 mg via INTRAVENOUS

## 2021-07-27 MED ORDER — PHENYLEPHRINE HCL (PRESSORS) 10 MG/ML IV SOLN
INTRAVENOUS | Status: AC
  Filled 2021-07-27: qty 1

## 2021-07-27 NOTE — Interval H&P Note (Signed)
History and Physical Interval Note: Preprocedure H&P from 07/27/21 ? was reviewed and there was no interval change after seeing and examining the patient.  Written consent was obtained from the patient after discussion of risks, benefits, and alternatives. Patient has consented to proceed with Esophagogastroduodenoscopy and Colonoscopy with possible intervention ? ? ?07/27/2021 ?7:26 AM ? ?Chelsea Rivera  has presented today for surgery, with the diagnosis of IDA D50.9.  The various methods of treatment have been discussed with the patient and family. After consideration of risks, benefits and other options for treatment, the patient has consented to  Procedure(s): ?COLONOSCOPY WITH PROPOFOL (N/A) ?ESOPHAGOGASTRODUODENOSCOPY (EGD) WITH PROPOFOL (N/A) as a surgical intervention.  The patient's history has been reviewed, patient examined, no change in status, stable for surgery.  I have reviewed the patient's chart and labs.  Questions were answered to the patient's satisfaction.   ? ? ?Chelsea Rivera ? ? ?

## 2021-07-27 NOTE — H&P (Signed)
? ?Pre-Procedure H&P ?  ?Patient ID: Chelsea Rivera is a 61 y.o. female. ? ?Gastroenterology Provider: Annamaria Helling, DO ? ?Referring Provider: Dawson Bills, NP ?PCP: Lavera Guise, MD ? ?Date: 07/27/2021 ? ?HPI ?Chelsea Rivera is a 61 y.o. female who presents today for Esophagogastroduodenoscopy and Colonoscopy for ida; phx colon polyps; fhx crc; ida constipation; gerd. ? ?Can go up to one week w/o BM at times. Doing better with ducolax and linzess. Taking po iron. No melena or hematochezia ? ?Personal history of constipation. H/o colonic inertia. ?Last colonoscopy 06/2018 with diverticulosis- transverse, descending, sigmoid. ?2005 colon with polyps ?2009 egd with irregular z line negative for BE and HP insetting of gastritis and duodenitis ?A1c 6.2 ? ?Past Medical History:  ?Diagnosis Date  ? Diabetes mellitus without complication ([place])   ? GERD (gastroesophageal reflux disease)   ? Hyperlipidemia   ? Hypertension   ? IDA (iron deficiency anemia) 01/27/2021  ? ? ?Past Surgical History:  ?Procedure Laterality Date  ? BREAST BIOPSY Right 2012  ? cleaned out fistula, not a biopsy  ? CHOLECYSTECTOMY    ? COLONOSCOPY WITH PROPOFOL N/A 07/07/2018  ? Procedure: COLONOSCOPY WITH PROPOFOL;  Surgeon: Lollie Sails, MD;  Location: [facility] ENDOSCOPY;  Service: Endoscopy;  Laterality: N/A;  ? SHOULDER ARTHROSCOPY    ? ? ?Family History ?Father- h/o crc ?No other h/o GI disease or malignancy ? ?Review of Systems  ?Constitutional:  Negative for activity change, appetite change, chills, diaphoresis, fatigue, fever and unexpected weight change.  ?HENT:  Negative for trouble swallowing and voice change.   ?Respiratory:  Negative for shortness of breath and wheezing.   ?Cardiovascular:  Negative for chest pain, palpitations and leg swelling.  ?Gastrointestinal:  Positive for constipation. Negative for abdominal distention, abdominal pain, anal bleeding, blood in stool, diarrhea, nausea, rectal pain and vomiting.   ?Musculoskeletal:  Negative for arthralgias and myalgias.  ?Skin:  Negative for color change and pallor.  ?Neurological:  Negative for dizziness, syncope and weakness.  ?Psychiatric/Behavioral:  Negative for confusion.   ?All other systems reviewed and are negative.  ? ?Medications ?No current facility-administered medications on file prior to encounter.  ? ?Current Outpatient Medications on File Prior to Encounter  ?Medication Sig Dispense Refill  ? aspirin EC 81 MG tablet Take 81 mg by mouth daily.    ? atorvastatin (LIPITOR) 10 MG tablet TAKE 1/2 TABLET EVERYDAY FOR HIGH CHOLESTEROL 45 tablet 5  ? bisoprolol (ZEBETA) 5 MG tablet TAKE 1 TABLET BY MOUTH AT BEDTIME 30 tablet 5  ? Continuous Blood Gluc Receiver (DEXCOM G6 RECEIVER) DEVI Use as directed for continuous glucose monitoring - E11.65 3 each 3  ? Continuous Blood Gluc Sensor (DEXCOM G6 SENSOR) MISC To use as directed with Dexcom sensor for continuous glucose monitoring. 3 each 3  ? COVID-19 mRNA Vac-TriS, Pfizer, (PFIZER-BIONT COVID-19 VAC-TRIS) SUSP injection Inject into the muscle. 0.3 mL 0  ? cyclobenzaprine (FLEXERIL) 10 MG tablet Take 1 tablet (10 mg total) by mouth at bedtime. 30 tablet 1  ? ferrous sulfate 325 (65 FE) MG EC tablet Take 1 tablet (325 mg total) by mouth daily. 30 tablet 3  ? Insulin Pen Needle (BD PEN NEEDLE NANO U/F) 32G X 4 MM MISC Use as directed 100 each 1  ? LINZESS 290 MCG CAPS capsule TAKE 1 CAPSULE BY MOUTH EVERY DAY BEFOREBREAKFAST 90 capsule 3  ? metFORMIN (GLUCOPHAGE) 1000 MG tablet Take 1 tablet (1,000 mg total) by mouth 2 (two) times  daily. 180 tablet 3  ? ondansetron (ZOFRAN) 8 MG tablet Take 1 tab po twice a daily as needed for nausea 60 tablet 2  ? ONETOUCH ULTRA test strip TEST BLOOD SUGAR 3 TIMES DAILY AS DIRECTED 100 each 1  ? patiromer (VELTASSA) 8.4 g packet     ? Continuous Blood Gluc Transmit (DEXCOM G6 TRANSMITTER) MISC Use as directed for continuous glucose monitoring  - E11.65 1 each 3  ? ? ?Pertinent  medications related to GI and procedure were reviewed by me with the patient prior to the procedure ? ? ?Current Facility-Administered Medications:  ?  0.9 %  sodium chloride infusion, , Intravenous, Continuous, Annamaria Helling, DO, Last Rate: 20 mL/hr at 07/27/21 0707, 20 mL/hr at 07/27/21 0707 ? sodium chloride 20 mL/hr (07/27/21 0707)  ?  ?  ? ?No Known Allergies ?Allergies were reviewed by me prior to the procedure ? ?Objective  ? ? ?Vitals:  ? 07/27/21 0655  ?BP: 138/70  ?Pulse: 87  ?Resp: 20  ?Temp: (!) 96.1 ?F (35.6 ?C)  ?TempSrc: Temporal  ?SpO2: 100%  ?Weight: 75.3 kg  ?Height: '5\' 4"'$  (1.626 m)  ? ? ? ?Physical Exam ?Vitals and nursing note reviewed.  ?Constitutional:   ?   General: She is not in acute distress. ?   Appearance: Normal appearance. She is not ill-appearing, toxic-appearing or diaphoretic.  ?HENT:  ?   Head: Normocephalic and atraumatic.  ?   Nose: Nose normal.  ?   Mouth/Throat:  ?   Mouth: Mucous membranes are moist.  ?   Pharynx: Oropharynx is clear.  ?Eyes:  ?   General: No scleral icterus. ?   Extraocular Movements: Extraocular movements intact.  ?Cardiovascular:  ?   Rate and Rhythm: Normal rate and regular rhythm.  ?   Heart sounds: Normal heart sounds. No murmur heard. ?  No friction rub. No gallop.  ?Pulmonary:  ?   Effort: Pulmonary effort is normal. No respiratory distress.  ?   Breath sounds: Normal breath sounds. No wheezing, rhonchi or rales.  ?Abdominal:  ?   General: Bowel sounds are normal. There is no distension.  ?   Palpations: Abdomen is soft.  ?   Tenderness: There is no abdominal tenderness. There is no guarding or rebound.  ?Musculoskeletal:  ?   Cervical back: Neck supple.  ?   Right lower leg: No edema.  ?   Left lower leg: No edema.  ?Skin: ?   General: Skin is warm and dry.  ?   Coloration: Skin is not jaundiced or pale.  ?Neurological:  ?   General: No focal deficit present.  ?   Mental Status: She is alert and oriented to person, place, and time. Mental  status is at baseline.  ?Psychiatric:     ?   Mood and Affect: Mood normal.     ?   Behavior: Behavior normal.     ?   Thought Content: Thought content normal.     ?   Judgment: Judgment normal.  ? ? ? ?Assessment:  ?Ms. Chelsea Rivera is a 61 y.o. female  who presents today for Esophagogastroduodenoscopy and Colonoscopy for IDA, phx colon polyps; constipation; gerd. ? ?Plan:  ?Esophagogastroduodenoscopy and Colonoscopy with possible intervention today ? ?Esophagogastroduodenoscopy and Colonoscopy with possible biopsy, control of bleeding, polypectomy, and interventions as necessary has been discussed with the patient/patient representative. Informed consent was obtained from the patient/patient representative after explaining the indication, nature, and risks of the  procedure including but not limited to death, bleeding, perforation, missed neoplasm/lesions, cardiorespiratory compromise, and reaction to medications. Opportunity for questions was given and appropriate answers were provided. Patient/patient representative has verbalized understanding is amenable to undergoing the procedure. ? ? ?Annamaria Helling, DO  ?[facility] ? ?Portions of the record may have been created with voice recognition software. Occasional wrong-word or 'sound-a-like' substitutions may have occurred due to the inherent limitations of voice recognition software.  Read the chart carefully and recognize, using context, where substitutions may have occurred. ?

## 2021-07-27 NOTE — Op Note (Signed)
[facility] ?Gastroenterology ?Patient Name: Chelsea Rivera ?Procedure Date: 07/27/2021 7:32 AM ?MRN: [id number] ?Account #: [id number] ?Date of Birth: 13-Jan-1961 ?Admit Type: Outpatient ?Age: 61 ?Room: [facility] ENDO ROOM 2 ?Gender: Female ?Note Status: Finalized ?Instrument Name: Colonoscope [id number] ?Procedure:             Colonoscopy ?Indications:           Iron deficiency anemia ?Providers:             Annamaria Helling DO, DO ?Referring MD:          Lavera Guise, MD (Referring MD) ?Medicines:             Monitored Anesthesia Care ?Complications:         No immediate complications. Estimated blood loss:  ?                       Minimal. ?Procedure:             Pre-Anesthesia Assessment: ?                       - Prior to the procedure, a History and Physical was  ?                       performed, and patient medications and allergies were  ?                       reviewed. The patient is competent. The risks and  ?                       benefits of the procedure and the sedation options and  ?                       risks were discussed with the patient. All questions  ?                       were answered and informed consent was obtained.  ?                       Patient identification and proposed procedure were  ?                       verified by the physician, the nurse, the anesthetist  ?                       and the technician in the endoscopy suite. Mental  ?                       Status Examination: alert and oriented. Airway  ?                       Examination: normal oropharyngeal airway and neck  ?                       mobility. Respiratory Examination: clear to  ?                       auscultation. CV Examination: RRR, no murmurs, no S3  ?  or S4. Prophylactic Antibiotics: The patient does not  ?                       require prophylactic antibiotics. Prior  ?                       Anticoagulants: The patient has taken no previous  ?                        anticoagulant or antiplatelet agents. ASA Grade  ?                       Assessment: III - A patient with severe systemic  ?                       disease. After reviewing the risks and benefits, the  ?                       patient was deemed in satisfactory condition to  ?                       undergo the procedure. The anesthesia plan was to use  ?                       monitored anesthesia care (MAC). Immediately prior to  ?                       administration of medications, the patient was  ?                       re-assessed for adequacy to receive sedatives. The  ?                       heart rate, respiratory rate, oxygen saturations,  ?                       blood pressure, adequacy of pulmonary ventilation, and  ?                       response to care were monitored throughout the  ?                       procedure. The physical status of the patient was  ?                       re-assessed after the procedure. ?                       After obtaining informed consent, the colonoscope was  ?                       passed under direct vision. Throughout the procedure,  ?                       the patient's blood pressure, pulse, and oxygen  ?                       saturations were monitored continuously. The  ?  Colonoscope was introduced through the anus and  ?                       advanced to the the cecum, identified by appendiceal  ?                       orifice and ileocecal valve. The colonoscopy was  ?                       performed without difficulty. The patient tolerated  ?                       the procedure well. The quality of the bowel  ?                       preparation was evaluated using the BBPS [facility] Bowel  ?                       Preparation Scale) with scores of: Right Colon = 2  ?                       (minor amount of residual staining, small fragments of  ?                       stool and/or opaque liquid, but mucosa seen well),  ?                        Transverse Colon = 3 (entire mucosa seen well with no  ?                       residual staining, small fragments of stool or opaque  ?                       liquid) and Left Colon = 3 (entire mucosa seen well  ?                       with no residual staining, small fragments of stool or  ?                       opaque liquid). The total BBPS score equals 8. The  ?                       quality of the bowel preparation was excellent. The  ?                       ileocecal valve, appendiceal orifice, and rectum were  ?                       photographed. ?Findings: ?     The perianal and digital rectal examinations were normal. Pertinent  ?     negatives include normal sphincter tone. ?     Three sessile polyps were found in the descending colon and cecum (2).  ?     The polyps were 1 to 2 mm in size. These polyps were removed with a cold  ?     biopsy forceps. Resection and retrieval were complete. Estimated blood  ?  loss was minimal. ?     Non-bleeding internal hemorrhoids were found during retroflexion. The  ?     hemorrhoids were Grade I (internal hemorrhoids that do not prolapse).  ?     Estimated blood loss: none. ?     The exam was otherwise without abnormality on direct and retroflexion  ?     views. ?Impression:            - Three 1 to 2 mm polyps in the descending colon and  ?                       in the cecum, removed with a cold biopsy forceps.  ?                       Resected and retrieved. ?                       - Non-bleeding internal hemorrhoids. ?                       - The examination was otherwise normal on direct and  ?                       retroflexion views. ?Recommendation:        - Discharge patient to home. ?                       - Resume previous diet. ?                       - Continue present medications. ?                       - Await pathology results. ?                       - Repeat colonoscopy for surveillance based on  ?                       pathology results. ?                        - Return to referring physician as previously  ?                       scheduled. ?                       - The findings and recommendations were discussed with  ?                       the patient. ?Procedure Code(s):     --- Professional --- ?                       [phone], Colonoscopy, flexible; with biopsy, single or  ?                       multiple ?Diagnosis Code(s):     --- Professional --- ?                       K63.5, Polyp of colon ?  K64.0, First degree hemorrhoids ?                       D50.9, Iron deficiency anemia, unspecified ?CPT copyright 2019 American Medical Association. All rights reserved. ?The codes documented in this report are preliminary and upon coder review may  ?be revised to meet current compliance requirements. ?Attending Participation: ?     I personally performed the entire procedure. ?Volney American, DO ?Annamaria Helling DO, DO ?07/27/2021 8:30:03 AM ?This report has been signed electronically. ?Number of Addenda: 0 ?Note Initiated On: 07/27/2021 7:32 AM ?Scope Withdrawal Time: 0 hours 14 minutes 26 seconds  ?Total Procedure Duration: 0 hours 27 minutes 55 seconds  ?Estimated Blood Loss:  Estimated blood loss was minimal. ?     [facility] ?

## 2021-07-27 NOTE — Op Note (Addendum)
[facility] ?Gastroenterology ?Patient Name: Chelsea Rivera ?Procedure Date: 07/27/2021 7:32 AM ?MRN: [id number] ?Account #: [id number] ?Date of Birth: 09-Jul-1960 ?Admit Type: Outpatient ?Age: 61 ?Room: [facility] ENDO ROOM 2 ?Gender: Female ?Note Status: Finalized ?Instrument Name: Upper Endoscope [id number] ?Procedure:             Upper GI endoscopy ?Indications:           Iron deficiency anemia ?Providers:             Annamaria Helling DO, DO ?Referring MD:          Lavera Guise, MD (Referring MD) ?Medicines:             Monitored Anesthesia Care ?Complications:         No immediate complications. Estimated blood loss:  ?                       Minimal. ?Procedure:             Pre-Anesthesia Assessment: ?                       - Prior to the procedure, a History and Physical was  ?                       performed, and patient medications and allergies were  ?                       reviewed. The patient is competent. The risks and  ?                       benefits of the procedure and the sedation options and  ?                       risks were discussed with the patient. All questions  ?                       were answered and informed consent was obtained.  ?                       Patient identification and proposed procedure were  ?                       verified by the physician, the nurse, the anesthetist  ?                       and the technician in the endoscopy suite. Mental  ?                       Status Examination: alert and oriented. Airway  ?                       Examination: normal oropharyngeal airway and neck  ?                       mobility. Respiratory Examination: clear to  ?                       auscultation. CV Examination: RRR, no murmurs, no S3  ?  or S4. Prophylactic Antibiotics: The patient does not  ?                       require prophylactic antibiotics. Prior  ?                       Anticoagulants: The patient has taken no previous  ?                        anticoagulant or antiplatelet agents. ASA Grade  ?                       Assessment: III - A patient with severe systemic  ?                       disease. After reviewing the risks and benefits, the  ?                       patient was deemed in satisfactory condition to  ?                       undergo the procedure. The anesthesia plan was to use  ?                       monitored anesthesia care (MAC). Immediately prior to  ?                       administration of medications, the patient was  ?                       re-assessed for adequacy to receive sedatives. The  ?                       heart rate, respiratory rate, oxygen saturations,  ?                       blood pressure, adequacy of pulmonary ventilation, and  ?                       response to care were monitored throughout the  ?                       procedure. The physical status of the patient was  ?                       re-assessed after the procedure. ?                       After obtaining informed consent, the endoscope was  ?                       passed under direct vision. Throughout the procedure,  ?                       the patient's blood pressure, pulse, and oxygen  ?                       saturations were monitored continuously. The Endoscope  ?  was introduced through the mouth, and advanced to the  ?                       second part of duodenum. The upper GI endoscopy was  ?                       accomplished without difficulty. The patient tolerated  ?                       the procedure well. ?Findings: ?     The duodenal bulb, first portion of the duodenum and second portion of  ?     the duodenum were normal. Biopsies for histology were taken with a cold  ?     forceps for evaluation of celiac disease. Estimated blood loss was  ?     minimal. ?     The entire examined stomach was normal. Biopsies were taken with a cold  ?     forceps for Helicobacter pylori testing. Estimated blood loss was  ?      minimal. ?     Esophagogastric landmarks were identified: the gastroesophageal junction  ?     was found at 35 cm from the incisors. ?     The Z-line was regular. Less than 1 cm. Previous biopsies negative for  ?     barretts esophagus Estimated blood loss: none. ?     The exam of the esophagus was otherwise normal. ?Impression:            - Normal duodenal bulb, first portion of the duodenum  ?                       and second portion of the duodenum. Biopsied. ?                       - Normal stomach. Biopsied. ?                       - Esophagogastric landmarks identified. ?                       - Z-line regular. ?Recommendation:        - Discharge patient to home. ?                       - Resume previous diet. ?                       - Continue present medications. ?                       - Await pathology results. ?                       - Return to referring physician as previously  ?                       scheduled. ?                       - The findings and recommendations were discussed with  ?  the patient. ?Procedure Code(s):     --- Professional --- ?                       [phone], Esophagogastroduodenoscopy, flexible,  ?                       transoral; with biopsy, single or multiple ?Diagnosis Code(s):     --- Professional --- ?                       D50.9, Iron deficiency anemia, unspecified ?CPT copyright 2019 American Medical Association. All rights reserved. ?The codes documented in this report are preliminary and upon coder review may  ?be revised to meet current compliance requirements. ?Attending Participation: ?     I personally performed the entire procedure. ?Volney American, DO ?Annamaria Helling DO, DO ?07/27/2021 7:53:58 AM ?This report has been signed electronically. ?Number of Addenda: 0 ?Note Initiated On: 07/27/2021 7:32 AM ?Estimated Blood Loss:  Estimated blood loss was minimal. ?     [facility] ?

## 2021-07-27 NOTE — Anesthesia Preprocedure Evaluation (Signed)
Anesthesia Evaluation  ?Patient identified by MRN, date of birth, ID band ?Patient awake ? ? ? ?Reviewed: ?Allergy & Precautions, NPO status , Patient's Chart, lab work & pertinent test results ? ?History of Anesthesia Complications ?Negative for: history of anesthetic complications ? ?Airway ?Mallampati: III ? ?TM Distance: <3 FB ?Neck ROM: full ? ? ? Dental ? ?(+) Chipped ?  ?Pulmonary ?neg pulmonary ROS, neg shortness of breath,  ?  ?Pulmonary exam normal ? ? ? ? ? ? ? Cardiovascular ?Exercise Tolerance: Good ?hypertension, (-) angina(-) Past MI and (-) DOE Normal cardiovascular exam ? ? ?  ?Neuro/Psych ?negative neurological ROS ? negative psych ROS  ? GI/Hepatic ?Neg liver ROS, GERD  Controlled,  ?Endo/Other  ?diabetes, Type 2 ? Renal/GU ?Renal disease  ?negative genitourinary ?  ?Musculoskeletal ? ? Abdominal ?  ?Peds ? Hematology ?negative hematology ROS ?(+)   ?Anesthesia Other Findings ?Past Medical History: ?No date: Diabetes mellitus without complication ([place]) ?No date: GERD (gastroesophageal reflux disease) ?No date: Hyperlipidemia ?No date: Hypertension ?01/27/2021: IDA (iron deficiency anemia) ? ?Past Surgical History: ?2012: BREAST BIOPSY; Right ?    Comment:  cleaned out fistula, not a biopsy ?No date: CHOLECYSTECTOMY ?07/07/2018: COLONOSCOPY WITH PROPOFOL; N/A ?    Comment:  Procedure: COLONOSCOPY WITH PROPOFOL;  Surgeon:  ?             Lollie Sails, MD;  Location: [facility] ENDOSCOPY;   ?             Service: Endoscopy;  Laterality: N/A; ?No date: SHOULDER ARTHROSCOPY ? ?BMI   ? Body Mass Index: 28.49 kg/m?  ?  ? ? Reproductive/Obstetrics ?negative OB ROS ? ?  ? ? ? ? ? ? ? ? ? ? ? ? ? ?  ?  ? ? ? ? ? ? ? ? ?Anesthesia Physical ?Anesthesia Plan ? ?ASA: 3 ? ?Anesthesia Plan: General  ? ?Post-op Pain Management:   ? ?Induction: Intravenous ? ?PONV Risk Score and Plan: Propofol infusion and TIVA ? ?Airway Management Planned: Natural Airway and Nasal  Cannula ? ?Additional Equipment:  ? ?Intra-op Plan:  ? ?Post-operative Plan:  ? ?Informed Consent: I have reviewed the patients History and Physical, chart, labs and discussed the procedure including the risks, benefits and alternatives for the proposed anesthesia with the patient or authorized representative who has indicated his/her understanding and acceptance.  ? ? ? ?Dental Advisory Given ? ?Plan Discussed with: Anesthesiologist, CRNA and Surgeon ? ?Anesthesia Plan Comments: (Patient consented for risks of anesthesia including but not limited to:  ?- adverse reactions to medications ?- risk of airway placement if required ?- damage to eyes, teeth, lips or other oral mucosa ?- nerve damage due to positioning  ?- sore throat or hoarseness ?- Damage to heart, brain, nerves, lungs, other parts of body or loss of life ? ?Patient voiced understanding.)  ? ? ? ? ? ? ?Anesthesia Quick Evaluation ? ?

## 2021-07-27 NOTE — Transfer of Care (Signed)
Immediate Anesthesia Transfer of Care Note ? ?Patient: Chelsea Rivera ? ?Procedure(s) Performed: COLONOSCOPY WITH PROPOFOL ?ESOPHAGOGASTRODUODENOSCOPY (EGD) WITH PROPOFOL ? ?Patient Location: PACU ? ?Anesthesia Type:General ? ?Level of Consciousness: awake, alert  and oriented ? ?Airway & Oxygen Therapy: Patient Spontanous Breathing ? ?Post-op Assessment: Report given to RN and Post -op Vital signs reviewed and stable ? ?Post vital signs: Reviewed and stable ? ?Last Vitals:  ?Vitals Value Taken Time  ?BP 121/60 07/27/21 0831  ?Temp 35.6 ?C 07/27/21 0830  ?Pulse 85 07/27/21 0831  ?Resp 14 07/27/21 0831  ?SpO2 100 % 07/27/21 0831  ?Vitals shown include unvalidated device data. ? ?Last Pain:  ?Vitals:  ? 07/27/21 0830  ?TempSrc: Tympanic  ?PainSc: Asleep  ?   ? ?  ? ?Complications: No notable events documented. ?

## 2021-07-27 NOTE — Anesthesia Procedure Notes (Signed)
Date/Time: 07/27/2021 7:37 AM ?Performed by: Johnna Acosta, CRNA ?Pre-anesthesia Checklist: Patient identified, Emergency Drugs available, Suction available, Patient being monitored and Timeout performed ?Patient Re-evaluated:Patient Re-evaluated prior to induction ?Oxygen Delivery Method: Nasal cannula ?Preoxygenation: Pre-oxygenation with 100% oxygen ?Induction Type: IV induction ? ? ? ? ?

## 2021-07-27 NOTE — Anesthesia Postprocedure Evaluation (Signed)
Anesthesia Post Note ? ?Patient: Chelsea Rivera ? ?Procedure(s) Performed: COLONOSCOPY WITH PROPOFOL ?ESOPHAGOGASTRODUODENOSCOPY (EGD) WITH PROPOFOL ? ?Patient location during evaluation: Endoscopy ?Anesthesia Type: General ?Level of consciousness: awake and alert ?Pain management: pain level controlled ?Vital Signs Assessment: post-procedure vital signs reviewed and stable ?Respiratory status: spontaneous breathing, nonlabored ventilation, respiratory function stable and patient connected to nasal cannula oxygen ?Cardiovascular status: blood pressure returned to baseline and stable ?Postop Assessment: no apparent nausea or vomiting ?Anesthetic complications: no ? ? ?No notable events documented. ? ? ?Last Vitals:  ?Vitals:  ? 07/27/21 0840 07/27/21 0850  ?BP: 120/67 136/73  ?Pulse: 83 79  ?Resp: 13 14  ?Temp:    ?SpO2: 100% 100%  ?  ?Last Pain:  ?Vitals:  ? 07/27/21 0850  ?TempSrc:   ?PainSc: 0-No pain  ? ? ?  ?  ?  ?  ?  ?  ? ?Chelsea Rivera ? ? ? ? ?

## 2021-07-28 ENCOUNTER — Encounter: Payer: Self-pay | Admitting: Gastroenterology

## 2021-07-28 LAB — SURGICAL PATHOLOGY

## 2021-07-31 ENCOUNTER — Other Ambulatory Visit: Payer: Self-pay

## 2021-07-31 ENCOUNTER — Inpatient Hospital Stay: Payer: BC Managed Care – PPO | Attending: Oncology

## 2021-07-31 DIAGNOSIS — D508 Other iron deficiency anemias: Secondary | ICD-10-CM | POA: Insufficient documentation

## 2021-07-31 DIAGNOSIS — D631 Anemia in chronic kidney disease: Secondary | ICD-10-CM | POA: Diagnosis present

## 2021-07-31 DIAGNOSIS — N1832 Chronic kidney disease, stage 3b: Secondary | ICD-10-CM | POA: Diagnosis present

## 2021-07-31 LAB — CBC WITH DIFFERENTIAL/PLATELET
Abs Immature Granulocytes: 0.01 10*3/uL (ref 0.00–0.07)
Basophils Absolute: 0.1 10*3/uL (ref 0.0–0.1)
Basophils Relative: 1 %
Eosinophils Absolute: 0.2 10*3/uL (ref 0.0–0.5)
Eosinophils Relative: 3 %
HCT: 38.1 % (ref 36.0–46.0)
Hemoglobin: 12.4 g/dL (ref 12.0–15.0)
Immature Granulocytes: 0 %
Lymphocytes Relative: 28 %
Lymphs Abs: 2 10*3/uL (ref 0.7–4.0)
MCH: 29.2 pg (ref 26.0–34.0)
MCHC: 32.5 g/dL (ref 30.0–36.0)
MCV: 89.6 fL (ref 80.0–100.0)
Monocytes Absolute: 0.5 10*3/uL (ref 0.1–1.0)
Monocytes Relative: 8 %
Neutro Abs: 4.3 10*3/uL (ref 1.7–7.7)
Neutrophils Relative %: 60 %
Platelets: 250 10*3/uL (ref 150–400)
RBC: 4.25 MIL/uL (ref 3.87–5.11)
RDW: 13.4 % (ref 11.5–15.5)
WBC: 7 10*3/uL (ref 4.0–10.5)
nRBC: 0 % (ref 0.0–0.2)

## 2021-07-31 LAB — IRON AND TIBC
Iron: 73 ug/dL (ref 28–170)
Saturation Ratios: 22 % (ref 10.4–31.8)
TIBC: 332 ug/dL (ref 250–450)
UIBC: 259 ug/dL

## 2021-07-31 LAB — FERRITIN: Ferritin: 74 ng/mL (ref 11–307)

## 2021-08-03 ENCOUNTER — Inpatient Hospital Stay: Payer: BC Managed Care – PPO | Admitting: Oncology

## 2021-08-03 ENCOUNTER — Telehealth: Payer: Self-pay | Admitting: *Deleted

## 2021-08-03 NOTE — Telephone Encounter (Signed)
Patient called and left a vm requesting to cnl today's apt. [place] currently [place] of [place] and had to travel unexpectedly. She will call back later to r/s her apt.  ? ?Mychart msg sent to patient to confirm receipt of her vm. ?

## 2021-08-11 ENCOUNTER — Encounter: Payer: Self-pay | Admitting: Internal Medicine

## 2021-08-11 ENCOUNTER — Telehealth: Payer: Self-pay

## 2021-08-11 NOTE — Telephone Encounter (Signed)
Faxed OMNIPOD  phar for pres renewal [phone] ?

## 2021-08-13 ENCOUNTER — Encounter: Payer: Self-pay | Admitting: Nurse Practitioner

## 2021-08-13 ENCOUNTER — Other Ambulatory Visit: Payer: Self-pay

## 2021-08-13 MED ORDER — LEVOFLOXACIN 500 MG PO TABS: 500.0000 mg | ORAL_TABLET | Freq: Every day | ORAL | 0 refills | Status: DC

## 2021-08-19 ENCOUNTER — Other Ambulatory Visit: Payer: Self-pay | Admitting: Internal Medicine

## 2021-08-25 ENCOUNTER — Ambulatory Visit: Payer: BC Managed Care – PPO | Admitting: Nurse Practitioner

## 2021-08-28 ENCOUNTER — Encounter: Payer: Self-pay | Admitting: Nurse Practitioner

## 2021-08-28 ENCOUNTER — Ambulatory Visit (INDEPENDENT_AMBULATORY_CARE_PROVIDER_SITE_OTHER): Payer: BC Managed Care – PPO | Admitting: Nurse Practitioner

## 2021-08-28 VITALS — BP 126/65 | HR 78 | Temp 98.3°F | Resp 16 | Ht 64.0 in | Wt 169.2 lb

## 2021-08-28 DIAGNOSIS — N182 Chronic kidney disease, stage 2 (mild): Secondary | ICD-10-CM | POA: Diagnosis not present

## 2021-08-28 DIAGNOSIS — Z794 Long term (current) use of insulin: Secondary | ICD-10-CM

## 2021-08-28 DIAGNOSIS — I10 Essential (primary) hypertension: Secondary | ICD-10-CM

## 2021-08-28 DIAGNOSIS — E782 Mixed hyperlipidemia: Secondary | ICD-10-CM

## 2021-08-28 DIAGNOSIS — E1122 Type 2 diabetes mellitus with diabetic chronic kidney disease: Secondary | ICD-10-CM

## 2021-08-28 LAB — POCT GLYCOSYLATED HEMOGLOBIN (HGB A1C): Hemoglobin A1C: 5.8 % — AB (ref 4.0–5.6)

## 2021-08-28 MED ORDER — TIRZEPATIDE 10 MG/0.5ML ~~LOC~~ SOAJ: 10.0000 mg | SUBCUTANEOUS | 2 refills | Status: DC

## 2021-08-28 NOTE — Progress Notes (Signed)
[facility] ?[address] ?[place],  [address] ? ?Internal MEDICINE  ?Office Visit Note ? ?Patient Name: Chelsea Rivera ? [id number]  ?[id number] ? ?Date of Service: 08/28/2021 ? ?Chief Complaint  ?Patient presents with  ? Follow-up  ? Diabetes  ? Hyperlipidemia  ? Hypertension  ? Gastroesophageal Reflux  ? Anemia  ? ? ?HPI ?Chelsea Rivera presents for follow-up visit for diabetes, hypertension, and hyperlipidemia.  Patient uses the OmniPod for continuous dosing of her basal and short acting insulin.  Her A1c was checked and it is 5.8 today.  Patient was very excited about this she states that she has not been under 6 on her A1c in a long time.  She is currently on Mounjaro 10 mg weekly and she has lost 6 pounds since her previous office visit.  On her OmniPod her basal insulin dose rate is at 0.7 units/h at night and 0.8 units/h during the day.  Her sliding scale preprandial insulin dose is typically 4 units or less according to her glucose level and her food intake. ?She has had to periodically decrease her insulin doses while on Mounjaro because the medication is helping to bring her levels down. ?Her blood pressure is well controlled with current medications and her other vital signs are also within normal limits. ? ? ? ?Current Medication: ?Outpatient Encounter Medications as of 08/28/2021  ?Medication Sig  ? aspirin EC 81 MG tablet Take 81 mg by mouth daily.  ? atorvastatin (LIPITOR) 10 MG tablet TAKE 1/2 TABLET EVERYDAY FOR HIGH CHOLESTEROL  ? bisoprolol (ZEBETA) 5 MG tablet TAKE 1 TABLET BY MOUTH AT BEDTIME  ? clotrimazole-betamethasone (LOTRISONE) cream Apply 1 application topically daily.  ? Continuous Blood Gluc Receiver (DEXCOM G6 RECEIVER) DEVI Use as directed for continuous glucose monitoring - E11.65  ? Continuous Blood Gluc Sensor (DEXCOM G6 SENSOR) MISC To use as directed with Dexcom sensor for continuous glucose monitoring.  ? Continuous Blood Gluc Transmit (DEXCOM G6 TRANSMITTER) MISC Use  as directed for continuous glucose monitoring  - E11.65  ? COVID-19 mRNA Vac-TriS, Pfizer, (PFIZER-BIONT COVID-19 VAC-TRIS) SUSP injection Inject into the muscle.  ? cyclobenzaprine (FLEXERIL) 10 MG tablet Take 1 tablet (10 mg total) by mouth at bedtime.  ? ferrous sulfate 325 (65 FE) MG EC tablet Take 1 tablet (325 mg total) by mouth daily.  ? Insulin Pen Needle (BD PEN NEEDLE NANO U/F) 32G X 4 MM MISC Use as directed  ? levofloxacin (LEVAQUIN) 500 MG tablet Take 1 tablet (500 mg total) by mouth daily.  ? LINZESS 290 MCG CAPS capsule TAKE 1 CAPSULE BY MOUTH EVERY DAY BEFOREBREAKFAST  ? metFORMIN (GLUCOPHAGE) 1000 MG tablet TAKE 1 TABLET BY MOUTH TWICE DAILY  ? NOVOLOG 100 UNIT/ML injection USE AS DIRECTED WITH INSULIN PUMP WITH OMNIPOD AND SLIDING SCALE **MAXIMUM OF 50 UNITS PER DAY**  ? omeprazole (PRILOSEC OTC) 20 MG tablet Take by mouth.  ? ondansetron (ZOFRAN) 8 MG tablet Take 1 tab po twice a daily as needed for nausea  ? ONETOUCH ULTRA test strip TEST BLOOD SUGAR 3 TIMES DAILY AS DIRECTED  ? patiromer (VELTASSA) 8.4 g packet   ? tirzepatide (MOUNJARO) 10 MG/0.5ML Pen Inject 10 mg into the skin once a week.  ? zolpidem (AMBIEN) 10 MG tablet Take 1 tablet (10 mg total) by mouth at bedtime as needed.  ? [DISCONTINUED] tirzepatide (MOUNJARO) 7.5 MG/0.5ML Pen Inject 7.5 mg into the skin once a week. Inject 7.5 mg into the skin once a week  ? [  DISCONTINUED] omeprazole (PRILOSEC) 40 MG capsule TAKE 1 CAPSULE BY MOUTH ONCE DAILY (Patient not taking: Reported on 08/28/2021)  ? ?No facility-administered encounter medications on file as of 08/28/2021.  ? ? ?Surgical History: ?Past Surgical History:  ?Procedure Laterality Date  ? BREAST BIOPSY Right 2012  ? cleaned out fistula, not a biopsy  ? CHOLECYSTECTOMY    ? COLONOSCOPY WITH PROPOFOL N/A 07/07/2018  ? Procedure: COLONOSCOPY WITH PROPOFOL;  Surgeon: Lollie Sails, MD;  Location: [facility] ENDOSCOPY;  Service: Endoscopy;  Laterality: N/A;  ? COLONOSCOPY WITH PROPOFOL  N/A 07/27/2021  ? Procedure: COLONOSCOPY WITH PROPOFOL;  Surgeon: Annamaria Helling, DO;  Location: [facility] ENDOSCOPY;  Service: Gastroenterology;  Laterality: N/A;  ? ESOPHAGOGASTRODUODENOSCOPY (EGD) WITH PROPOFOL N/A 07/27/2021  ? Procedure: ESOPHAGOGASTRODUODENOSCOPY (EGD) WITH PROPOFOL;  Surgeon: Annamaria Helling, DO;  Location: [facility] ENDOSCOPY;  Service: Gastroenterology;  Laterality: N/A;  ? SHOULDER ARTHROSCOPY    ? ? ?Medical History: ?Past Medical History:  ?Diagnosis Date  ? Diabetes mellitus without complication ([place])   ? GERD (gastroesophageal reflux disease)   ? Hyperlipidemia   ? Hypertension   ? IDA (iron deficiency anemia) 01/27/2021  ? ? ?Family History: ?Family History  ?Problem Relation Age of Onset  ? Lung cancer Mother   ? Colon cancer Father   ? Breast cancer Neg Hx   ? ? ?Social History  ? ?Socioeconomic History  ? Marital status: Married  ?  Spouse name: Not on file  ? Number of children: Not on file  ? Years of education: Not on file  ? Highest education level: Not on file  ?Occupational History  ? Not on file  ?Tobacco Use  ? Smoking status: Never  ? Smokeless tobacco: Never  ?Vaping Use  ? Vaping Use: Never used  ?Substance and Sexual Activity  ? Alcohol use: Yes  ?  Comment: socially  ? Drug use: No  ? Sexual activity: Not on file  ?Other Topics Concern  ? Not on file  ?Social History Narrative  ? Not on file  ? ?Social Determinants of Health  ? ?Financial Resource Strain: Not on file  ?Food Insecurity: Not on file  ?Transportation Needs: Not on file  ?Physical Activity: Not on file  ?Stress: Not on file  ?Social Connections: Not on file  ?Intimate Partner Violence: Not on file  ? ? ? ? ?Review of Systems  ?Constitutional:  Negative for chills, fatigue and unexpected weight change.  ?HENT:  Negative for congestion, rhinorrhea, sneezing and sore throat.   ?Eyes:  Negative for redness.  ?Respiratory: Negative.  Negative for cough, chest tightness, shortness of breath and wheezing.    ?Cardiovascular: Negative.  Negative for chest pain and palpitations.  ?Gastrointestinal:  Negative for abdominal pain, constipation, diarrhea, nausea and vomiting.  ?Genitourinary:  Negative for dysuria and frequency.  ?Musculoskeletal:  Negative for arthralgias, back pain, joint swelling and neck pain.  ?Skin:  Negative for rash.  ?Neurological: Negative.  Negative for tremors and numbness.  ?Hematological:  Negative for adenopathy. Does not bruise/bleed easily.  ?Psychiatric/Behavioral:  Negative for behavioral problems (Depression), sleep disturbance and suicidal ideas. The patient is not nervous/anxious.   ? ?Vital Signs: ?BP 126/65   Pulse 78   Temp 98.3 ?F (36.8 ?C)   Resp 16   Ht '5\' 4"'$  (1.626 m)   Wt 169 lb 3.2 oz (76.7 kg)   SpO2 98%   BMI 29.04 kg/m?  ? ? ?Physical Exam ?Vitals reviewed.  ?Constitutional:   ?  General: She is not in acute distress. ?   Appearance: Normal appearance. She is not ill-appearing.  ?HENT:  ?   Head: Normocephalic and atraumatic.  ?Eyes:  ?   Pupils: Pupils are equal, round, and reactive to light.  ?Cardiovascular:  ?   Rate and Rhythm: Normal rate and regular rhythm.  ?Pulmonary:  ?   Effort: Pulmonary effort is normal. No respiratory distress.  ?Neurological:  ?   Mental Status: She is alert and oriented to person, place, and time.  ?Psychiatric:     ?   Mood and Affect: Mood normal.     ?   Behavior: Behavior normal.  ? ? ? ? ? ?Assessment/Plan: ?1. Type 2 diabetes mellitus with stage 2 chronic kidney disease, with long-term current use of insulin ([place]) ?A1c today is 5.8, patient is under good control, insulin doses are continuing to be titrated down per patient's glucose levels.  Patient is tolerating the 10 mg dose of Mounjaro and wants to stay at this dose currently, refills ordered.  She is also taking metformin 1000 mg twice daily the goal will be to decrease and eventually discontinue metformin first.  Follow-up in 3 months to repeat A1c, if A1c is continuing to  improve at that time, will discuss decreasing metformin dose. ?- POCT HgB A1C ?- tirzepatide (MOUNJARO) 10 MG/0.5ML Pen; Inject 10 mg into the skin once a week.  Dispense: 6 mL; Refill: 2 ? ?2. Benign hypertension ?

## 2021-08-29 ENCOUNTER — Encounter: Payer: Self-pay | Admitting: Nurse Practitioner

## 2021-08-30 ENCOUNTER — Telehealth: Payer: Self-pay

## 2021-08-30 NOTE — Telephone Encounter (Signed)
PA for MOUNJARO 10 mg sent 08/30/21 @ 530 pm ?

## 2021-08-31 ENCOUNTER — Telehealth: Payer: Self-pay

## 2021-08-31 NOTE — Telephone Encounter (Signed)
Mounjaro approved from 08-30-21 to 08-31-22 ?

## 2021-09-07 ENCOUNTER — Other Ambulatory Visit: Payer: Self-pay | Admitting: Nurse Practitioner

## 2021-09-07 DIAGNOSIS — F5101 Primary insomnia: Secondary | ICD-10-CM

## 2021-09-08 ENCOUNTER — Encounter: Payer: Self-pay | Admitting: Oncology

## 2021-09-08 ENCOUNTER — Inpatient Hospital Stay: Payer: BC Managed Care – PPO | Attending: Oncology | Admitting: Oncology

## 2021-09-08 VITALS — BP 110/72 | HR 79 | Temp 97.5°F | Wt 170.0 lb

## 2021-09-08 DIAGNOSIS — D631 Anemia in chronic kidney disease: Secondary | ICD-10-CM | POA: Diagnosis present

## 2021-09-08 DIAGNOSIS — Z7982 Long term (current) use of aspirin: Secondary | ICD-10-CM | POA: Diagnosis not present

## 2021-09-08 DIAGNOSIS — N1832 Chronic kidney disease, stage 3b: Secondary | ICD-10-CM | POA: Diagnosis present

## 2021-09-08 DIAGNOSIS — R0602 Shortness of breath: Secondary | ICD-10-CM | POA: Insufficient documentation

## 2021-09-08 DIAGNOSIS — D508 Other iron deficiency anemias: Secondary | ICD-10-CM | POA: Insufficient documentation

## 2021-09-08 DIAGNOSIS — Z794 Long term (current) use of insulin: Secondary | ICD-10-CM | POA: Diagnosis not present

## 2021-09-08 DIAGNOSIS — Z7984 Long term (current) use of oral hypoglycemic drugs: Secondary | ICD-10-CM | POA: Insufficient documentation

## 2021-09-08 DIAGNOSIS — Z79899 Other long term (current) drug therapy: Secondary | ICD-10-CM | POA: Insufficient documentation

## 2021-09-09 ENCOUNTER — Encounter: Payer: Self-pay | Admitting: Oncology

## 2021-09-09 NOTE — Progress Notes (Signed)
?Hematology/Oncology Progress note ?[phone] [phone] ?  ? ? ? ?Patient Care Team: ?Jonetta Osgood, NP as PCP - General (Nurse Practitioner) ? ?REFERRING PROVIDER: ?Lavera Guise, MD ? ?CHIEF COMPLAINTS/REASON FOR VISIT:  ?Follow up with anemia ? ?HISTORY OF PRESENTING ILLNESS:  ?Chelsea Rivera is a  61 y.o.  female with PMH listed below who was referred to me for evaluation of anemia ?Reviewed patient's recent labs that was done.  ?12/26/2020 Labs revealed anemia with hemoglobin of 10.9.   ?Iron saturation was 8, ferritin was 8.  ? ?Reviewed patient's previous labs ordered by primary care physician's office, anemia is chronic onset , duration is since at least 2017 ?01/20/2021 she has blood work done at nephrologist office. Hb was 10.4 ? ?Associated signs and symptoms: Patient reports fatigue.  SOB with exertion.  ?Denies weight loss, easy bruising, hematochezia, hemoptysis, hematuria. ?Patient has history CKD, follows up with Dr.Kolluru.  ?01/20/2022 her creatinine level has improved.  ?She has diverticulosis and had colonoscopy done in 2020.  ?She reports that years ago she has had capsule endoscopy.  ?Denies any black or bloody stool, denies any abdominal pain.  ? ?INTERVAL HISTORY ?Chelsea Rivera is a 61 y.o. female who has above history reviewed by me today presents for follow up visit for anemia ?Patient had a blood work done in March 2023, she had to reschedule her follow-up appointment.  Today she reports feeling well.  No new complaints. ? ? ?Review of Systems  ?Constitutional:  Negative for appetite change, chills, fatigue and fever.  ?HENT:   Negative for hearing loss and voice change.   ?Eyes:  Negative for eye problems.  ?Respiratory:  Negative for chest tightness and cough.   ?Cardiovascular:  Negative for chest pain.  ?Gastrointestinal:  Negative for abdominal distention, abdominal pain and blood in stool.  ?Endocrine: Negative for hot flashes.  ?Genitourinary:  Negative  for difficulty urinating and frequency.   ?Musculoskeletal:  Negative for arthralgias.  ?Skin:  Negative for itching and rash.  ?Neurological:  Negative for extremity weakness.  ?Hematological:  Negative for adenopathy.  ?Psychiatric/Behavioral:  Negative for confusion.   ? ? ?MEDICAL HISTORY:  ?Past Medical History:  ?Diagnosis Date  ? Diabetes mellitus without complication ([place])   ? GERD (gastroesophageal reflux disease)   ? Hyperlipidemia   ? Hypertension   ? IDA (iron deficiency anemia) 01/27/2021  ? ? ?SURGICAL HISTORY: ?Past Surgical History:  ?Procedure Laterality Date  ? BREAST BIOPSY Right 2012  ? cleaned out fistula, not a biopsy  ? CHOLECYSTECTOMY    ? COLONOSCOPY WITH PROPOFOL N/A 07/07/2018  ? Procedure: COLONOSCOPY WITH PROPOFOL;  Surgeon: Lollie Sails, MD;  Location: [facility] ENDOSCOPY;  Service: Endoscopy;  Laterality: N/A;  ? COLONOSCOPY WITH PROPOFOL N/A 07/27/2021  ? Procedure: COLONOSCOPY WITH PROPOFOL;  Surgeon: Annamaria Helling, DO;  Location: [facility] ENDOSCOPY;  Service: Gastroenterology;  Laterality: N/A;  ? ESOPHAGOGASTRODUODENOSCOPY (EGD) WITH PROPOFOL N/A 07/27/2021  ? Procedure: ESOPHAGOGASTRODUODENOSCOPY (EGD) WITH PROPOFOL;  Surgeon: Annamaria Helling, DO;  Location: [facility] ENDOSCOPY;  Service: Gastroenterology;  Laterality: N/A;  ? SHOULDER ARTHROSCOPY    ? ? ?SOCIAL HISTORY: ?Social History  ? ?Socioeconomic History  ? Marital status: Married  ?  Spouse name: Not on file  ? Number of children: Not on file  ? Years of education: Not on file  ? Highest education level: Not on file  ?Occupational History  ? Not on file  ?Tobacco Use  ? Smoking status: Never  ?  Smokeless tobacco: Never  ?Vaping Use  ? Vaping Use: Never used  ?Substance and Sexual Activity  ? Alcohol use: Yes  ?  Comment: socially  ? Drug use: No  ? Sexual activity: Not on file  ?Other Topics Concern  ? Not on file  ?Social History Narrative  ? Not on file  ? ?Social Determinants of Health  ? ?Financial Resource Strain:  Not on file  ?Food Insecurity: Not on file  ?Transportation Needs: Not on file  ?Physical Activity: Not on file  ?Stress: Not on file  ?Social Connections: Not on file  ?Intimate Partner Violence: Not on file  ? ? ?FAMILY HISTORY: ?Family History  ?Problem Relation Age of Onset  ? Lung cancer Mother   ? Colon cancer Father   ? Breast cancer Neg Hx   ? ? ?ALLERGIES:  has No Known Allergies. ? ?MEDICATIONS:  ?Current Outpatient Medications  ?Medication Sig Dispense Refill  ? aspirin EC 81 MG tablet Take 81 mg by mouth daily.    ? atorvastatin (LIPITOR) 10 MG tablet TAKE 1/2 TABLET EVERYDAY FOR HIGH CHOLESTEROL 45 tablet 5  ? bisoprolol (ZEBETA) 5 MG tablet TAKE 1 TABLET BY MOUTH AT BEDTIME 30 tablet 5  ? clotrimazole-betamethasone (LOTRISONE) cream Apply 1 application topically daily. 45 g 1  ? Continuous Blood Gluc Receiver (DEXCOM G6 RECEIVER) DEVI Use as directed for continuous glucose monitoring - E11.65 3 each 3  ? Continuous Blood Gluc Sensor (DEXCOM G6 SENSOR) MISC To use as directed with Dexcom sensor for continuous glucose monitoring. 3 each 3  ? Continuous Blood Gluc Transmit (DEXCOM G6 TRANSMITTER) MISC Use as directed for continuous glucose monitoring  - E11.65 1 each 3  ? COVID-19 mRNA Vac-TriS, Pfizer, (PFIZER-BIONT COVID-19 VAC-TRIS) SUSP injection Inject into the muscle. 0.3 mL 0  ? cyclobenzaprine (FLEXERIL) 10 MG tablet Take 1 tablet (10 mg total) by mouth at bedtime. 30 tablet 1  ? ferrous sulfate 325 (65 FE) MG EC tablet Take 1 tablet (325 mg total) by mouth daily. 30 tablet 3  ? Insulin Pen Needle (BD PEN NEEDLE NANO U/F) 32G X 4 MM MISC Use as directed 100 each 1  ? levofloxacin (LEVAQUIN) 500 MG tablet Take 1 tablet (500 mg total) by mouth daily. 7 tablet 0  ? LINZESS 290 MCG CAPS capsule TAKE 1 CAPSULE BY MOUTH EVERY DAY BEFOREBREAKFAST 90 capsule 3  ? metFORMIN (GLUCOPHAGE) 1000 MG tablet TAKE 1 TABLET BY MOUTH TWICE DAILY 180 tablet 3  ? NOVOLOG 100 UNIT/ML injection USE AS DIRECTED WITH  INSULIN PUMP WITH OMNIPOD AND SLIDING SCALE **MAXIMUM OF 50 UNITS PER DAY** 20 mL 3  ? omeprazole (PRILOSEC OTC) 20 MG tablet Take by mouth.    ? ondansetron (ZOFRAN) 8 MG tablet Take 1 tab po twice a daily as needed for nausea 60 tablet 2  ? ONETOUCH ULTRA test strip TEST BLOOD SUGAR 3 TIMES DAILY AS DIRECTED 100 each 1  ? patiromer (VELTASSA) 8.4 g packet     ? tirzepatide (MOUNJARO) 10 MG/0.5ML Pen Inject 10 mg into the skin once a week. 6 mL 2  ? zolpidem (AMBIEN) 10 MG tablet TAKE 1 TABLET BY MOUTH AT BEDTIME AS NEEDED. 90 tablet 0  ? ?No current facility-administered medications for this visit.  ? ? ? ?PHYSICAL EXAMINATION: ?ECOG PERFORMANCE STATUS: 0 - Asymptomatic ?Vitals:  ? 09/08/21 1350  ?BP: 110/72  ?Pulse: 79  ?Temp: (!) 97.5 ?F (36.4 ?C)  ? ?Filed Weights  ? 09/08/21 1350  ?  Weight: 170 lb (77.1 kg)  ? ? ?Physical Exam ?Constitutional:   ?   General: She is not in acute distress. ?HENT:  ?   Head: Normocephalic and atraumatic.  ?Eyes:  ?   General: No scleral icterus. ?Cardiovascular:  ?   Rate and Rhythm: Normal rate and regular rhythm.  ?   Heart sounds: Normal heart sounds.  ?Pulmonary:  ?   Effort: Pulmonary effort is normal. No respiratory distress.  ?   Breath sounds: No wheezing.  ?Abdominal:  ?   General: Bowel sounds are normal. There is no distension.  ?   Palpations: Abdomen is soft.  ?Musculoskeletal:     ?   General: No deformity. Normal range of motion.  ?   Cervical back: Normal range of motion and neck supple.  ?Skin: ?   General: Skin is warm and dry.  ?   Findings: No erythema or rash.  ?Neurological:  ?   Mental Status: She is alert and oriented to person, place, and time. Mental status is at baseline.  ?   Cranial Nerves: No cranial nerve deficit.  ?   Coordination: Coordination normal.  ?Psychiatric:     ?   Mood and Affect: Mood normal.  ? ? ? ?LABORATORY DATA:  ?I have reviewed the data as listed ?Lab Results  ?Component Value Date  ? WBC 7.0 07/31/2021  ? HGB 12.4 07/31/2021  ?  HCT 38.1 07/31/2021  ? MCV 89.6 07/31/2021  ? PLT 250 07/31/2021  ? ?Recent Labs  ?  12/26/20 ?1622 12/26/20 ?1623  ?NA 140 140  ?K 4.9 4.9  ?CL 103 102  ?CO2 19* 16*  ?GLUCOSE 150* 151*  ?BUN 31* 30*  ?CR

## 2021-09-18 ENCOUNTER — Other Ambulatory Visit: Payer: Self-pay | Admitting: Oncology

## 2021-09-18 ENCOUNTER — Other Ambulatory Visit: Payer: Self-pay | Admitting: Internal Medicine

## 2021-09-18 ENCOUNTER — Other Ambulatory Visit: Payer: Self-pay | Admitting: Nurse Practitioner

## 2021-09-18 DIAGNOSIS — K5909 Other constipation: Secondary | ICD-10-CM

## 2021-10-12 ENCOUNTER — Encounter: Payer: Self-pay | Admitting: Nurse Practitioner

## 2021-10-12 ENCOUNTER — Other Ambulatory Visit: Payer: Self-pay

## 2021-10-12 MED ORDER — MOUNJARO 12.5 MG/0.5ML ~~LOC~~ SOAJ: 12.5000 mg | SUBCUTANEOUS | 1 refills | Status: DC

## 2021-10-12 MED ORDER — LINACLOTIDE 145 MCG PO CAPS: 145.0000 ug | ORAL_CAPSULE | Freq: Every day | ORAL | 1 refills | Status: DC

## 2021-10-15 ENCOUNTER — Other Ambulatory Visit: Payer: Self-pay | Admitting: Internal Medicine

## 2021-10-15 DIAGNOSIS — E1165 Type 2 diabetes mellitus with hyperglycemia: Secondary | ICD-10-CM

## 2021-10-16 ENCOUNTER — Other Ambulatory Visit: Payer: Self-pay | Admitting: Internal Medicine

## 2021-10-16 DIAGNOSIS — E1165 Type 2 diabetes mellitus with hyperglycemia: Secondary | ICD-10-CM

## 2021-11-03 ENCOUNTER — Other Ambulatory Visit: Payer: Self-pay

## 2021-11-03 MED ORDER — MOUNJARO 12.5 MG/0.5ML ~~LOC~~ SOAJ: 12.5000 mg | SUBCUTANEOUS | 1 refills | Status: DC

## 2021-11-09 ENCOUNTER — Telehealth: Payer: Self-pay

## 2021-11-09 ENCOUNTER — Encounter: Payer: Self-pay | Admitting: Internal Medicine

## 2021-11-09 NOTE — Telephone Encounter (Addendum)
Faxed  [phone] omnipod pre to Omnipod phar

## 2021-11-13 ENCOUNTER — Encounter: Payer: Self-pay | Admitting: Nurse Practitioner

## 2021-11-13 ENCOUNTER — Other Ambulatory Visit: Payer: Self-pay | Admitting: Nurse Practitioner

## 2021-11-19 ENCOUNTER — Other Ambulatory Visit: Payer: Self-pay | Admitting: Internal Medicine

## 2021-11-19 DIAGNOSIS — E1165 Type 2 diabetes mellitus with hyperglycemia: Secondary | ICD-10-CM

## 2021-11-22 ENCOUNTER — Telehealth: Payer: Self-pay

## 2021-11-22 NOTE — Telephone Encounter (Signed)
PA sent for DEXCOM G6 SENSOR 11/21/21 @ 548 pm

## 2021-11-24 ENCOUNTER — Telehealth: Payer: Self-pay

## 2021-11-24 NOTE — Telephone Encounter (Signed)
LMOM. Pt needs to know Dexcom sensor was approved from 11-22-21 to 11-23-22

## 2021-11-28 ENCOUNTER — Telehealth: Payer: Self-pay

## 2021-11-28 NOTE — Telephone Encounter (Signed)
PA for [facility] G6 Transmitter was sent 11/28/21 @ 6:07 pm

## 2021-12-04 ENCOUNTER — Ambulatory Visit: Payer: BC Managed Care – PPO | Admitting: Nurse Practitioner

## 2021-12-07 ENCOUNTER — Other Ambulatory Visit: Payer: Self-pay | Admitting: Internal Medicine

## 2021-12-07 ENCOUNTER — Other Ambulatory Visit: Payer: Self-pay | Admitting: Nurse Practitioner

## 2021-12-07 DIAGNOSIS — F5101 Primary insomnia: Secondary | ICD-10-CM

## 2021-12-13 ENCOUNTER — Other Ambulatory Visit: Payer: Self-pay | Admitting: Internal Medicine

## 2021-12-13 ENCOUNTER — Encounter: Payer: Self-pay | Admitting: Nurse Practitioner

## 2021-12-13 ENCOUNTER — Encounter: Payer: Self-pay | Admitting: Internal Medicine

## 2021-12-13 MED ORDER — NIRMATRELVIR/RITONAVIR (PAXLOVID) TABLET (RENAL DOSING): 2.0000 | ORAL_TABLET | Freq: Two times a day (BID) | ORAL | 0 refills | Status: AC

## 2021-12-25 ENCOUNTER — Ambulatory Visit (INDEPENDENT_AMBULATORY_CARE_PROVIDER_SITE_OTHER): Payer: BC Managed Care – PPO | Admitting: Nurse Practitioner

## 2021-12-25 ENCOUNTER — Encounter: Payer: Self-pay | Admitting: Nurse Practitioner

## 2021-12-25 VITALS — BP 115/76 | HR 77 | Temp 97.8°F | Resp 16 | Ht 64.0 in | Wt 171.0 lb

## 2021-12-25 DIAGNOSIS — N182 Chronic kidney disease, stage 2 (mild): Secondary | ICD-10-CM

## 2021-12-25 DIAGNOSIS — D638 Anemia in other chronic diseases classified elsewhere: Secondary | ICD-10-CM

## 2021-12-25 DIAGNOSIS — Z794 Long term (current) use of insulin: Secondary | ICD-10-CM

## 2021-12-25 DIAGNOSIS — E1122 Type 2 diabetes mellitus with diabetic chronic kidney disease: Secondary | ICD-10-CM | POA: Diagnosis not present

## 2021-12-25 DIAGNOSIS — Z760 Encounter for issue of repeat prescription: Secondary | ICD-10-CM

## 2021-12-25 DIAGNOSIS — Z6829 Body mass index (BMI) 29.0-29.9, adult: Secondary | ICD-10-CM

## 2021-12-25 LAB — POCT GLYCOSYLATED HEMOGLOBIN (HGB A1C): Hemoglobin A1C: 6.4 % — AB (ref 4.0–5.6)

## 2021-12-25 MED ORDER — BISOPROLOL FUMARATE 5 MG PO TABS: 5.0000 mg | ORAL_TABLET | Freq: Every day | ORAL | 3 refills | Status: DC

## 2021-12-25 MED ORDER — FERROUS SULFATE 325 (65 FE) MG PO TBEC: 325.0000 mg | DELAYED_RELEASE_TABLET | ORAL | 3 refills | Status: AC

## 2021-12-25 MED ORDER — DIETHYLPROPION HCL ER 75 MG PO TB24: 75.0000 mg | ORAL_TABLET | Freq: Every day | ORAL | 0 refills | Status: DC

## 2021-12-25 NOTE — Progress Notes (Signed)
[facility] [place], [place] [address]  Internal MEDICINE  Office Visit Note  Patient Name: Chelsea Rivera  [id number]  [id number]  Date of Service: 12/25/2021  Chief Complaint  Patient presents with   Follow-up   Diabetes   Hypertension   Hyperlipidemia   Quality Metric Gaps    Diabetic eye exam    HPI Chelsea Rivera presents for a follow up visit for diabetes, hypertension. A1c is 6.4, elevated from 5.8, was trying to titrate down on insulin and was too aggressive, has already gone back up --Wants to try appetite suppressant to help her with weight loss. Is on mounjaro for her diabetes but is not seeing any significant weight loss. She is currently on 12.5 mg dose.  Current dose on insulin pump: --0.7 units per hour during the day and 0.6 units per hour at night for her basal insulin (total dose is 16.2 units daily) --bolus insulin dose for meals is 1 unit per 15 g of carb Due for diabetic eye exam, has an eye doctor already.  Currently taking iron supplement every Tuesday, Thursday and Sunday. Hematology has signed off, will continued to monitor iron deficiency anemia in primary care.     Current Medication: Outpatient Encounter Medications as of 12/25/2021  Medication Sig   aspirin EC 81 MG tablet Take 81 mg by mouth daily.   atorvastatin (LIPITOR) 10 MG tablet TAKE 1/2 TABLET EVERYDAY FOR HIGH CHOLESTEROL   clotrimazole-betamethasone (LOTRISONE) cream Apply 1 application topically daily.   Continuous Blood Gluc Receiver (DEXCOM G6 RECEIVER) DEVI Use as directed for continuous glucose monitoring - E11.65   Continuous Blood Gluc Sensor (DEXCOM G6 SENSOR) MISC TO BE USED AS DIRECTED FOR CONTINUOUS GLUCOSE MONITORING.   Continuous Blood Gluc Transmit (DEXCOM G6 TRANSMITTER) MISC USE AS DIRECTED FOR CONTINUOUS GLUCOSE MONITORING.   cyclobenzaprine (FLEXERIL) 10 MG tablet Take 1 tablet (10 mg total) by mouth at bedtime.   Diethylpropion HCl CR 75 MG TB24 Take  1 tablet (75 mg total) by mouth daily before breakfast.   linaclotide (LINZESS) 145 MCG CAPS capsule Take 1 capsule (145 mcg total) by mouth daily before breakfast.   metFORMIN (GLUCOPHAGE) 1000 MG tablet TAKE 1 TABLET BY MOUTH TWICE DAILY   MOUNJARO 12.5 MG/0.5ML Pen INJECT 12.'5MG'$  INTO THE SKIN ONCE A WEEK   NOVOLOG 100 UNIT/ML injection USE AS DIRECTED WITH INSULIN PUMP WITH OMNIPOD AND SLIDING SCALE **MAXIMUM OF 50 UNITS PER DAY**   omeprazole (PRILOSEC OTC) 20 MG tablet Take by mouth.   ondansetron (ZOFRAN) 8 MG tablet Take 1 tab po twice a daily as needed for nausea   patiromer (VELTASSA) 8.4 g packet    zolpidem (AMBIEN) 10 MG tablet TAKE 1 TABLET BY MOUTH AT BEDTIME AS NEEDED.   [DISCONTINUED] bisoprolol (ZEBETA) 5 MG tablet TAKE 1 TABLET BY MOUTH AT BEDTIME   [DISCONTINUED] COVID-19 mRNA Vac-TriS, Pfizer, (PFIZER-BIONT COVID-19 VAC-TRIS) SUSP injection Inject into the muscle.   [DISCONTINUED] ferrous sulfate 325 (65 FE) MG EC tablet Take 1 tablet (325 mg total) by mouth daily.   [DISCONTINUED] Insulin Pen Needle (BD PEN NEEDLE NANO U/F) 32G X 4 MM MISC Use as directed   [DISCONTINUED] levofloxacin (LEVAQUIN) 500 MG tablet Take 1 tablet (500 mg total) by mouth daily.   [DISCONTINUED] ONETOUCH ULTRA test strip TEST BLOOD SUGAR 3 TIMES DAILY AS DIRECTED   bisoprolol (ZEBETA) 5 MG tablet Take 1 tablet (5 mg total) by mouth at bedtime.   ferrous sulfate 325 (65 FE)  MG EC tablet Take 1 tablet (325 mg total) by mouth 3 (three) times a week.   No facility-administered encounter medications on file as of 12/25/2021.    Surgical History: Past Surgical History:  Procedure Laterality Date   BREAST BIOPSY Right 2012   cleaned out fistula, not a biopsy   CHOLECYSTECTOMY     COLONOSCOPY WITH PROPOFOL N/A 07/07/2018   Procedure: COLONOSCOPY WITH PROPOFOL;  Surgeon: Lollie Sails, MD;  Location: [facility] ENDOSCOPY;  Service: Endoscopy;  Laterality: N/A;   COLONOSCOPY WITH PROPOFOL N/A 07/27/2021    Procedure: COLONOSCOPY WITH PROPOFOL;  Surgeon: Annamaria Helling, DO;  Location: [facility] ENDOSCOPY;  Service: Gastroenterology;  Laterality: N/A;   ESOPHAGOGASTRODUODENOSCOPY (EGD) WITH PROPOFOL N/A 07/27/2021   Procedure: ESOPHAGOGASTRODUODENOSCOPY (EGD) WITH PROPOFOL;  Surgeon: Annamaria Helling, DO;  Location: [facility];  Service: Gastroenterology;  Laterality: N/A;   SHOULDER ARTHROSCOPY      Medical History: Past Medical History:  Diagnosis Date   Diabetes mellitus without complication (HCC)    GERD (gastroesophageal reflux disease)    Hyperlipidemia    Hypertension    IDA (iron deficiency anemia) 01/27/2021    Family History: Family History  Problem Relation Age of Onset   Lung cancer Mother    Colon cancer Father    Breast cancer Neg Hx     Social History   Socioeconomic History   Marital status: Married    Spouse name: Not on file   Number of children: Not on file   Years of education: Not on file   Highest education level: Not on file  Occupational History   Not on file  Tobacco Use   Smoking status: Never   Smokeless tobacco: Never  Vaping Use   Vaping Use: Never used  Substance and Sexual Activity   Alcohol use: Yes    Comment: socially   Drug use: No   Sexual activity: Not on file  Other Topics Concern   Not on file  Social History Narrative   Not on file   Social Determinants of Health   Financial Resource Strain: Not on file  Food Insecurity: Not on file  Transportation Needs: Not on file  Physical Activity: Not on file  Stress: Not on file  Social Connections: Not on file  Intimate Partner Violence: Not on file      Review of Systems  Constitutional:  Negative for chills, fatigue and unexpected weight change.  HENT:  Negative for congestion, rhinorrhea, sneezing and sore throat.   Eyes:  Negative for redness.  Respiratory: Negative.  Negative for cough, chest tightness, shortness of breath and wheezing.   Cardiovascular:  Negative.  Negative for chest pain and palpitations.  Gastrointestinal:  Negative for abdominal pain, constipation, diarrhea, nausea and vomiting.  Genitourinary:  Negative for dysuria and frequency.  Musculoskeletal:  Negative for arthralgias, back pain, joint swelling and neck pain.  Skin:  Negative for rash.  Neurological: Negative.  Negative for tremors and numbness.  Hematological:  Negative for adenopathy. Does not bruise/bleed easily.  Psychiatric/Behavioral:  Negative for behavioral problems (Depression), sleep disturbance and suicidal ideas. The patient is not nervous/anxious.     Vital Signs: BP 115/76   Pulse 77   Temp 97.8 F (36.6 C)   Resp 16   Ht '5\' 4"'$  (1.626 m)   Wt 171 lb (77.6 kg)   SpO2 99%   BMI 29.35 kg/m    Physical Exam Vitals reviewed.  Constitutional:      General: She is  not in acute distress.    Appearance: Normal appearance. She is not ill-appearing.  HENT:     Head: Normocephalic and atraumatic.  Eyes:     Pupils: Pupils are equal, round, and reactive to light.  Cardiovascular:     Rate and Rhythm: Normal rate and regular rhythm.  Pulmonary:     Effort: Pulmonary effort is normal. No respiratory distress.  Neurological:     Mental Status: She is alert and oriented to person, place, and time.  Psychiatric:        Mood and Affect: Mood normal.        Behavior: Behavior normal.        Assessment/Plan: 1. Type 2 diabetes mellitus with stage 2 chronic kidney disease, with long-term current use of insulin (HCC) A1c is 6.4, kidney labs were stable. Continuing to titrate insulin dose via pump. Follow up in 3 months.  - POCT HgB A1C  2. Anemia in other chronic diseases classified elsewhere Taking 3x weekly - ferrous sulfate 325 (65 FE) MG EC tablet; Take 1 tablet (325 mg total) by mouth 3 (three) times a week.  Dispense: 30 tablet; Refill: 3  3. BMI 29.0-29.9,adult Will try appetite suppressant, follow up in 4 weeks  - Diethylpropion HCl  CR 75 MG TB24; Take 1 tablet (75 mg total) by mouth daily before breakfast.  Dispense: 30 tablet; Refill: 0  4. Medication refill - bisoprolol (ZEBETA) 5 MG tablet; Take 1 tablet (5 mg total) by mouth at bedtime.  Dispense: 90 tablet; Refill: 3   General Counseling: nakeda lebron understanding of the findings of todays visit and agrees with plan of treatment. I have discussed any further diagnostic evaluation that may be needed or ordered today. We also reviewed her medications today. she has been encouraged to call the office with any questions or concerns that should arise related to todays visit.    Orders Placed This Encounter  Procedures   POCT HgB A1C    Meds ordered this encounter  Medications   Diethylpropion HCl CR 75 MG TB24    Sig: Take 1 tablet (75 mg total) by mouth daily before breakfast.    Dispense:  30 tablet    Refill:  0   ferrous sulfate 325 (65 FE) MG EC tablet    Sig: Take 1 tablet (325 mg total) by mouth 3 (three) times a week.    Dispense:  30 tablet    Refill:  3   bisoprolol (ZEBETA) 5 MG tablet    Sig: Take 1 tablet (5 mg total) by mouth at bedtime.    Dispense:  90 tablet    Refill:  3    FOR NEXT FILL    Return in about 4 weeks (around 01/22/2022) for F/U, Weight loss, Kleber Crean PCP also need a1c in 3 months.   Total time spent:30 Minutes Time spent includes review of chart, medications, test results, and follow up plan with the patient.   [place] Controlled Substance Database was reviewed by me.  This patient was seen by Jonetta Osgood, FNP-C in collaboration with Dr. Clayborn Bigness as a part of collaborative care agreement.   Castle Lamons R. Valetta Fuller, MSN, FNP-C Internal medicine

## 2022-01-25 ENCOUNTER — Ambulatory Visit (INDEPENDENT_AMBULATORY_CARE_PROVIDER_SITE_OTHER): Payer: BC Managed Care – PPO | Admitting: Nurse Practitioner

## 2022-01-25 ENCOUNTER — Encounter: Payer: Self-pay | Admitting: Nurse Practitioner

## 2022-01-25 VITALS — BP 116/72 | HR 80 | Temp 96.5°F | Resp 16 | Ht 64.0 in | Wt 165.4 lb

## 2022-01-25 DIAGNOSIS — Z6828 Body mass index (BMI) 28.0-28.9, adult: Secondary | ICD-10-CM

## 2022-01-25 DIAGNOSIS — E663 Overweight: Secondary | ICD-10-CM

## 2022-01-25 DIAGNOSIS — Z794 Long term (current) use of insulin: Secondary | ICD-10-CM

## 2022-01-25 DIAGNOSIS — Z23 Encounter for immunization: Secondary | ICD-10-CM

## 2022-01-25 DIAGNOSIS — N182 Chronic kidney disease, stage 2 (mild): Secondary | ICD-10-CM

## 2022-01-25 DIAGNOSIS — I10 Essential (primary) hypertension: Secondary | ICD-10-CM

## 2022-01-25 DIAGNOSIS — E1122 Type 2 diabetes mellitus with diabetic chronic kidney disease: Secondary | ICD-10-CM

## 2022-01-25 MED ORDER — DIETHYLPROPION HCL ER 75 MG PO TB24
75.0000 mg | ORAL_TABLET | Freq: Every day | ORAL | 0 refills | Status: DC
Start: 2022-01-25 — End: 2022-02-22

## 2022-01-25 NOTE — Progress Notes (Signed)
[facility] [place], [place] [address]  Internal MEDICINE  Office Visit Note  Patient Name: Chelsea Rivera  [id number]  [id number]  Date of Service: 01/25/2022  Chief Complaint  Patient presents with   Follow-up    Follow up weight loss    Diabetes   Gastroesophageal Reflux   Hyperlipidemia   Hypertension    HPI Chelsea Rivera presents for a follow up visit for diabetes, hypertension, and weight loss management.  Diabetes -- she has decreased her total basal insulin dose  per 24 hours to 14.7 units. On average uses 50 units of short acting insulin every 3 days. Tolerating 12.5 mg mounjaro dose.  Hypertension -- well controlled with current medications Weight loss management -- started diethylpropion at her previous office visit, tolerating med well, lost 6 lbs since last visit. Wants to continue the medication for another month Needs flu shot   Current Medication: Outpatient Encounter Medications as of 01/25/2022  Medication Sig   aspirin EC 81 MG tablet Take 81 mg by mouth daily.   atorvastatin (LIPITOR) 10 MG tablet TAKE 1/2 TABLET EVERYDAY FOR HIGH CHOLESTEROL   bisoprolol (ZEBETA) 5 MG tablet Take 1 tablet (5 mg total) by mouth at bedtime.   clotrimazole-betamethasone (LOTRISONE) cream Apply 1 application topically daily.   Continuous Blood Gluc Receiver (DEXCOM G6 RECEIVER) DEVI Use as directed for continuous glucose monitoring - E11.65   Continuous Blood Gluc Sensor (DEXCOM G6 SENSOR) MISC TO BE USED AS DIRECTED FOR CONTINUOUS GLUCOSE MONITORING.   Continuous Blood Gluc Transmit (DEXCOM G6 TRANSMITTER) MISC USE AS DIRECTED FOR CONTINUOUS GLUCOSE MONITORING.   cyclobenzaprine (FLEXERIL) 10 MG tablet Take 1 tablet (10 mg total) by mouth at bedtime.   ferrous sulfate 325 (65 FE) MG EC tablet Take 1 tablet (325 mg total) by mouth 3 (three) times a week.   linaclotide (LINZESS) 145 MCG CAPS capsule Take 1 capsule (145 mcg total) by mouth daily before  breakfast.   metFORMIN (GLUCOPHAGE) 1000 MG tablet TAKE 1 TABLET BY MOUTH TWICE DAILY   MOUNJARO 12.5 MG/0.5ML Pen INJECT 12.'5MG'$  INTO THE SKIN ONCE A WEEK   NOVOLOG 100 UNIT/ML injection USE AS DIRECTED WITH INSULIN PUMP WITH OMNIPOD AND SLIDING SCALE **MAXIMUM OF 50 UNITS PER DAY**   omeprazole (PRILOSEC OTC) 20 MG tablet Take by mouth.   ondansetron (ZOFRAN) 8 MG tablet Take 1 tab po twice a daily as needed for nausea   patiromer (VELTASSA) 8.4 g packet    zolpidem (AMBIEN) 10 MG tablet TAKE 1 TABLET BY MOUTH AT BEDTIME AS NEEDED.   [DISCONTINUED] Diethylpropion HCl CR 75 MG TB24 Take 1 tablet (75 mg total) by mouth daily before breakfast.   Diethylpropion HCl CR 75 MG TB24 Take 1 tablet (75 mg total) by mouth daily before breakfast.   No facility-administered encounter medications on file as of 01/25/2022.    Surgical History: Past Surgical History:  Procedure Laterality Date   BREAST BIOPSY Right 2012   cleaned out fistula, not a biopsy   CHOLECYSTECTOMY     COLONOSCOPY WITH PROPOFOL N/A 07/07/2018   Procedure: COLONOSCOPY WITH PROPOFOL;  Surgeon: Lollie Sails, MD;  Location: [facility] ENDOSCOPY;  Service: Endoscopy;  Laterality: N/A;   COLONOSCOPY WITH PROPOFOL N/A 07/27/2021   Procedure: COLONOSCOPY WITH PROPOFOL;  Surgeon: Annamaria Helling, DO;  Location: [facility] ENDOSCOPY;  Service: Gastroenterology;  Laterality: N/A;   ESOPHAGOGASTRODUODENOSCOPY (EGD) WITH PROPOFOL N/A 07/27/2021   Procedure: ESOPHAGOGASTRODUODENOSCOPY (EGD) WITH PROPOFOL;  Surgeon: Annamaria Helling,  DO;  Location: [facility] ENDOSCOPY;  Service: Gastroenterology;  Laterality: N/A;   SHOULDER ARTHROSCOPY      Medical History: Past Medical History:  Diagnosis Date   Diabetes mellitus without complication (HCC)    GERD (gastroesophageal reflux disease)    Hyperlipidemia    Hypertension    IDA (iron deficiency anemia) 01/27/2021    Family History: Family History  Problem Relation Age of Onset   Lung  cancer Mother    Colon cancer Father    Breast cancer Neg Hx     Social History   Socioeconomic History   Marital status: Married    Spouse name: Not on file   Number of children: Not on file   Years of education: Not on file   Highest education level: Not on file  Occupational History   Not on file  Tobacco Use   Smoking status: Never   Smokeless tobacco: Never  Vaping Use   Vaping Use: Never used  Substance and Sexual Activity   Alcohol use: Yes    Comment: socially   Drug use: No   Sexual activity: Not on file  Other Topics Concern   Not on file  Social History Narrative   Not on file   Social Determinants of Health   Financial Resource Strain: Not on file  Food Insecurity: Not on file  Transportation Needs: Not on file  Physical Activity: Not on file  Stress: Not on file  Social Connections: Not on file  Intimate Partner Violence: Not on file      Review of Systems  Constitutional:  Negative for chills, fatigue and unexpected weight change.  HENT:  Negative for congestion, rhinorrhea, sneezing and sore throat.   Eyes:  Negative for redness.  Respiratory: Negative.  Negative for cough, chest tightness, shortness of breath and wheezing.   Cardiovascular: Negative.  Negative for chest pain and palpitations.  Gastrointestinal:  Negative for abdominal pain, constipation, diarrhea, nausea and vomiting.  Genitourinary:  Negative for dysuria and frequency.  Musculoskeletal:  Negative for arthralgias, back pain, joint swelling and neck pain.  Skin:  Negative for rash.  Neurological: Negative.  Negative for tremors and numbness.  Hematological:  Negative for adenopathy. Does not bruise/bleed easily.  Psychiatric/Behavioral:  Negative for behavioral problems (Depression), sleep disturbance and suicidal ideas. The patient is not nervous/anxious.     Vital Signs: BP 116/72 Comment: 146/66  Pulse 80   Temp (!) 96.5 F (35.8 C)   Resp 16   Ht '5\' 4"'$  (1.626 m)   Wt  165 lb 6.4 oz (75 kg)   SpO2 97%   BMI 28.39 kg/m    Physical Exam Vitals reviewed.  Constitutional:      General: She is not in acute distress.    Appearance: Normal appearance. She is not ill-appearing.  HENT:     Head: Normocephalic and atraumatic.  Eyes:     Pupils: Pupils are equal, round, and reactive to light.  Cardiovascular:     Rate and Rhythm: Normal rate and regular rhythm.  Pulmonary:     Effort: Pulmonary effort is normal. No respiratory distress.  Neurological:     Mental Status: She is alert and oriented to person, place, and time.  Psychiatric:        Mood and Affect: Mood normal.        Behavior: Behavior normal.        Assessment/Plan: 1. Type 2 diabetes mellitus with stage 2 chronic kidney disease, with long-term current use  of insulin (HCC) Total daily basal insulin dose is down to 14.7 units. Tolerating new med. Continue to titrate insulin dose as needed based on glucose readings and continue other medications as prescribed.   2. Benign hypertension Stable with current meds, no changes  3. Overweight with body mass index (BMI) of 28 to 28.9 in adult Continue diethylpropion x1 month, follow up in 4 weeks in office - Diethylpropion HCl CR 75 MG TB24; Take 1 tablet (75 mg total) by mouth daily before breakfast.  Dispense: 30 tablet; Refill: 0  4. Needs flu shot Flu vaccine administered today.  - Flu Vaccine MDCK QUAD PF   General Counseling: Chelsea Rivera understanding of the findings of todays visit and agrees with plan of treatment. I have discussed any further diagnostic evaluation that may be needed or ordered today. We also reviewed her medications today. she has been encouraged to call the office with any questions or concerns that should arise related to todays visit.    Orders Placed This Encounter  Procedures   Flu Vaccine MDCK QUAD PF    Meds ordered this encounter  Medications   Diethylpropion HCl CR 75 MG TB24    Sig: Take 1  tablet (75 mg total) by mouth daily before breakfast.    Dispense:  30 tablet    Refill:  0    Return in about 4 weeks (around 02/22/2022) for F/U, Weight loss, Chelsea Rivera PCP.   Total time spent:30 Minutes Time spent includes review of chart, medications, test results, and follow up plan with the patient.    Controlled Substance Database was reviewed by me.  This patient was seen by Jonetta Osgood, FNP-C in collaboration with Dr. Clayborn Bigness as a part of collaborative care agreement.   Chelsea Werling R. Valetta Fuller, MSN, FNP-C Internal medicine

## 2022-02-06 ENCOUNTER — Other Ambulatory Visit: Payer: Self-pay | Admitting: Nurse Practitioner

## 2022-02-18 ENCOUNTER — Encounter: Payer: MEDICAID | Attending: Neurology | Primary: Internal Medicine

## 2022-02-21 ENCOUNTER — Other Ambulatory Visit: Payer: Self-pay | Admitting: Nurse Practitioner

## 2022-02-22 ENCOUNTER — Encounter: Payer: Self-pay | Admitting: Nurse Practitioner

## 2022-02-22 ENCOUNTER — Ambulatory Visit (INDEPENDENT_AMBULATORY_CARE_PROVIDER_SITE_OTHER): Payer: BC Managed Care – PPO | Admitting: Nurse Practitioner

## 2022-02-22 VITALS — BP 113/60 | HR 86 | Temp 97.3°F | Resp 16 | Ht 64.0 in | Wt 161.2 lb

## 2022-02-22 DIAGNOSIS — I10 Essential (primary) hypertension: Secondary | ICD-10-CM | POA: Diagnosis not present

## 2022-02-22 DIAGNOSIS — E1122 Type 2 diabetes mellitus with diabetic chronic kidney disease: Secondary | ICD-10-CM | POA: Diagnosis not present

## 2022-02-22 DIAGNOSIS — Z6828 Body mass index (BMI) 28.0-28.9, adult: Secondary | ICD-10-CM

## 2022-02-22 DIAGNOSIS — Z794 Long term (current) use of insulin: Secondary | ICD-10-CM

## 2022-02-22 DIAGNOSIS — E663 Overweight: Secondary | ICD-10-CM

## 2022-02-22 DIAGNOSIS — N182 Chronic kidney disease, stage 2 (mild): Secondary | ICD-10-CM | POA: Diagnosis not present

## 2022-02-22 MED ORDER — DIETHYLPROPION HCL ER 75 MG PO TB24: 75.0000 mg | ORAL_TABLET | Freq: Every day | ORAL | 0 refills | Status: DC

## 2022-02-22 NOTE — Progress Notes (Signed)
[facility] [place], [place] [address]  Internal MEDICINE  Office Visit Note  Patient Name: Chelsea Rivera  [id number]  [id number]  Date of Service: 02/22/2022  Chief Complaint  Patient presents with   Follow-up   Hypertension   Gastroesophageal Reflux   Hyperlipidemia   Diabetes    HPI Chelsea Rivera presents for a follow up visit for diabetes, GERD and weight loss management. Diabetes continues to decrease insulin dose, did have a low of 40. Basal dose per 24 hrs --13.8 units Hypertension -- BP well controlled Weight loss -- taking diethylpropion, has lost 4 more lbs.  GERD -- controlled with med    Current Medication: Outpatient Encounter Medications as of 02/22/2022  Medication Sig   aspirin EC 81 MG tablet Take 81 mg by mouth daily.   atorvastatin (LIPITOR) 10 MG tablet TAKE 1/2 TABLET EVERYDAY FOR HIGH CHOLESTEROL   bisoprolol (ZEBETA) 5 MG tablet Take 1 tablet (5 mg total) by mouth at bedtime.   clotrimazole-betamethasone (LOTRISONE) cream Apply 1 application topically daily.   Continuous Blood Gluc Receiver (DEXCOM G6 RECEIVER) DEVI Use as directed for continuous glucose monitoring - E11.65   Continuous Blood Gluc Sensor (DEXCOM G6 SENSOR) MISC TO BE USED AS DIRECTED FOR CONTINUOUS GLUCOSE MONITORING.   Continuous Blood Gluc Transmit (DEXCOM G6 TRANSMITTER) MISC USE AS DIRECTED FOR CONTINUOUS GLUCOSE MONITORING.   cyclobenzaprine (FLEXERIL) 10 MG tablet Take 1 tablet (10 mg total) by mouth at bedtime.   ferrous sulfate 325 (65 FE) MG EC tablet Take 1 tablet (325 mg total) by mouth 3 (three) times a week.   linaclotide (LINZESS) 145 MCG CAPS capsule Take 1 capsule (145 mcg total) by mouth daily before breakfast.   metFORMIN (GLUCOPHAGE) 1000 MG tablet TAKE 1 TABLET BY MOUTH TWICE DAILY   MOUNJARO 12.5 MG/0.5ML Pen INJECT 12.'5MG'$  INTO THE SKIN ONCE A WEEK   NOVOLOG 100 UNIT/ML injection USE AS DIRECTED WITH INSULIN PUMP WITH OMNIPOD AND SLIDING SCALE  **MAXIMUM OF 50 UNITS PER DAY**   omeprazole (PRILOSEC OTC) 20 MG tablet Take by mouth.   ondansetron (ZOFRAN) 8 MG tablet Take 1 tab po twice a daily as needed for nausea   patiromer (VELTASSA) 8.4 g packet    zolpidem (AMBIEN) 10 MG tablet TAKE 1 TABLET BY MOUTH AT BEDTIME AS NEEDED.   [DISCONTINUED] Diethylpropion HCl CR 75 MG TB24 Take 1 tablet (75 mg total) by mouth daily before breakfast.   Diethylpropion HCl CR 75 MG TB24 Take 1 tablet (75 mg total) by mouth daily before breakfast.   No facility-administered encounter medications on file as of 02/22/2022.    Surgical History: Past Surgical History:  Procedure Laterality Date   BREAST BIOPSY Right 2012   cleaned out fistula, not a biopsy   CHOLECYSTECTOMY     COLONOSCOPY WITH PROPOFOL N/A 07/07/2018   Procedure: COLONOSCOPY WITH PROPOFOL;  Surgeon: Lollie Sails, MD;  Location: [facility] ENDOSCOPY;  Service: Endoscopy;  Laterality: N/A;   COLONOSCOPY WITH PROPOFOL N/A 07/27/2021   Procedure: COLONOSCOPY WITH PROPOFOL;  Surgeon: Annamaria Helling, DO;  Location: [facility] ENDOSCOPY;  Service: Gastroenterology;  Laterality: N/A;   ESOPHAGOGASTRODUODENOSCOPY (EGD) WITH PROPOFOL N/A 07/27/2021   Procedure: ESOPHAGOGASTRODUODENOSCOPY (EGD) WITH PROPOFOL;  Surgeon: Annamaria Helling, DO;  Location: [facility];  Service: Gastroenterology;  Laterality: N/A;   SHOULDER ARTHROSCOPY      Medical History: Past Medical History:  Diagnosis Date   Diabetes mellitus without complication ([place])    GERD (gastroesophageal reflux  disease)    Hyperlipidemia    Hypertension    IDA (iron deficiency anemia) 01/27/2021    Family History: Family History  Problem Relation Age of Onset   Lung cancer Mother    Colon cancer Father    Breast cancer Neg Hx     Social History   Socioeconomic History   Marital status: Married    Spouse name: Not on file   Number of children: Not on file   Years of education: Not on file   Highest education  level: Not on file  Occupational History   Not on file  Tobacco Use   Smoking status: Never   Smokeless tobacco: Never  Vaping Use   Vaping Use: Never used  Substance and Sexual Activity   Alcohol use: Yes    Comment: socially   Drug use: No   Sexual activity: Not on file  Other Topics Concern   Not on file  Social History Narrative   Not on file   Social Determinants of Health   Financial Resource Strain: Not on file  Food Insecurity: Not on file  Transportation Needs: Not on file  Physical Activity: Not on file  Stress: Not on file  Social Connections: Not on file  Intimate Partner Violence: Not on file      Review of Systems  Constitutional:  Negative for chills, fatigue and unexpected weight change.  HENT:  Negative for congestion, rhinorrhea, sneezing and sore throat.   Eyes:  Negative for redness.  Respiratory: Negative.  Negative for cough, chest tightness, shortness of breath and wheezing.   Cardiovascular: Negative.  Negative for chest pain and palpitations.  Gastrointestinal:  Negative for abdominal pain, constipation, diarrhea, nausea and vomiting.  Genitourinary:  Negative for dysuria and frequency.  Musculoskeletal:  Negative for arthralgias, back pain, joint swelling and neck pain.  Skin:  Negative for rash.  Neurological: Negative.  Negative for tremors and numbness.  Hematological:  Negative for adenopathy. Does not bruise/bleed easily.  Psychiatric/Behavioral:  Negative for behavioral problems (Depression), sleep disturbance and suicidal ideas. The patient is not nervous/anxious.     Vital Signs: BP 113/60   Pulse 86   Temp (!) 97.3 F (36.3 C)   Resp 16   Ht '5\' 4"'$  (1.626 m)   Wt 161 lb 3.2 oz (73.1 kg)   SpO2 99%   BMI 27.67 kg/m    Physical Exam Vitals reviewed.  Constitutional:      General: She is not in acute distress.    Appearance: Normal appearance. She is not ill-appearing.  HENT:     Head: Normocephalic and atraumatic.   Eyes:     Pupils: Pupils are equal, round, and reactive to light.  Cardiovascular:     Rate and Rhythm: Normal rate and regular rhythm.  Pulmonary:     Effort: Pulmonary effort is normal. No respiratory distress.  Neurological:     Mental Status: She is alert and oriented to person, place, and time.  Psychiatric:        Mood and Affect: Mood normal.        Behavior: Behavior normal.        Assessment/Plan: 1. Type 2 diabetes mellitus with stage 2 chronic kidney disease, with long-term current use of insulin ([place]) Repeat A1c at next visit in november  2. Benign hypertension Stable, continue medication as prescribed  3. Overweight with body mass index (BMI) of 28 to 28.9 in adult Continue med for another 4 weeks, follow up in  november - Diethylpropion HCl CR 75 MG TB24; Take 1 tablet (75 mg total) by mouth daily before breakfast.  Dispense: 30 tablet; Refill: 0   General Counseling: justus duerr understanding of the findings of todays visit and agrees with plan of treatment. I have discussed any further diagnostic evaluation that may be needed or ordered today. We also reviewed her medications today. she has been encouraged to call the office with any questions or concerns that should arise related to todays visit.    No orders of the defined types were placed in this encounter.   Meds ordered this encounter  Medications   Diethylpropion HCl CR 75 MG TB24    Sig: Take 1 tablet (75 mg total) by mouth daily before breakfast.    Dispense:  30 tablet    Refill:  0    Return in 1 month (on 03/26/2022) for previously scheduled, F/U, Recheck A1C, Weight loss, Tion Tse PCP.   Total time spent:30 Minutes Time spent includes review of chart, medications, test results, and follow up plan with the patient.   [place] Controlled Substance Database was reviewed by me.  This patient was seen by Jonetta Osgood, FNP-C in collaboration with Dr. Clayborn Bigness as a part of collaborative  care agreement.   Elma Limas R. Valetta Fuller, MSN, FNP-C Internal medicine

## 2022-02-23 ENCOUNTER — Telehealth: Payer: Self-pay

## 2022-02-23 NOTE — Telephone Encounter (Signed)
Pt is paying for Diethylpropion out of pocket.

## 2022-03-05 ENCOUNTER — Other Ambulatory Visit: Payer: Self-pay | Admitting: Internal Medicine

## 2022-03-05 ENCOUNTER — Telehealth: Payer: Self-pay

## 2022-03-05 DIAGNOSIS — F5101 Primary insomnia: Secondary | ICD-10-CM

## 2022-03-15 ENCOUNTER — Other Ambulatory Visit: Payer: Self-pay | Admitting: Internal Medicine

## 2022-03-15 DIAGNOSIS — Z794 Long term (current) use of insulin: Secondary | ICD-10-CM

## 2022-03-17 NOTE — Telephone Encounter (Signed)
Patient's Ambien was sent.

## 2022-03-26 ENCOUNTER — Ambulatory Visit (INDEPENDENT_AMBULATORY_CARE_PROVIDER_SITE_OTHER): Payer: BC Managed Care – PPO | Admitting: Nurse Practitioner

## 2022-03-26 ENCOUNTER — Encounter: Payer: Self-pay | Admitting: Nurse Practitioner

## 2022-03-26 VITALS — BP 118/62 | HR 82 | Temp 95.4°F | Resp 16 | Ht 64.0 in | Wt 159.4 lb

## 2022-03-26 DIAGNOSIS — N182 Chronic kidney disease, stage 2 (mild): Secondary | ICD-10-CM

## 2022-03-26 DIAGNOSIS — E663 Overweight: Secondary | ICD-10-CM | POA: Diagnosis not present

## 2022-03-26 DIAGNOSIS — Z794 Long term (current) use of insulin: Secondary | ICD-10-CM | POA: Diagnosis not present

## 2022-03-26 DIAGNOSIS — Z6828 Body mass index (BMI) 28.0-28.9, adult: Secondary | ICD-10-CM

## 2022-03-26 DIAGNOSIS — E1122 Type 2 diabetes mellitus with diabetic chronic kidney disease: Secondary | ICD-10-CM

## 2022-03-26 DIAGNOSIS — I10 Essential (primary) hypertension: Secondary | ICD-10-CM | POA: Diagnosis not present

## 2022-03-26 LAB — POCT GLYCOSYLATED HEMOGLOBIN (HGB A1C): Hemoglobin A1C: 6.1 % — AB (ref 4.0–5.6)

## 2022-03-26 MED ORDER — DIETHYLPROPION HCL ER 75 MG PO TB24: 75.0000 mg | ORAL_TABLET | Freq: Every day | ORAL | 0 refills | Status: DC

## 2022-03-26 NOTE — Progress Notes (Signed)
[facility] [place], [place] [address]  Internal MEDICINE  Office Visit Note  Patient Name: Chelsea Rivera  [id number]  [id number]  Date of Service: 03/26/2022  Chief Complaint  Patient presents with   Follow-up   Diabetes   Gastroesophageal Reflux   Hypertension   Hyperlipidemia    HPI Chelsea Rivera presents for a follow up visit for hypertension, diabetes, and weight loss management.  Diabetes -- A1C is 6.1 today which is improved for 6.4. takes mounjaro 12.5 mg weekly. Has been able to decrease her total 24 hour basal insulin dose to 12.7 units. Hypertension -- remained well controlled with current medications Weight loss -- started on diethylpropion ER, working well over the past month. Lost 3 lbs. Wants to continue the medication.     Current Medication: Outpatient Encounter Medications as of 03/26/2022  Medication Sig   aspirin EC 81 MG tablet Take 81 mg by mouth daily.   atorvastatin (LIPITOR) 10 MG tablet TAKE 1/2 TABLET EVERYDAY FOR HIGH CHOLESTEROL   bisoprolol (ZEBETA) 5 MG tablet Take 1 tablet (5 mg total) by mouth at bedtime.   clotrimazole-betamethasone (LOTRISONE) cream Apply 1 application topically daily.   Continuous Blood Gluc Receiver (DEXCOM G6 RECEIVER) DEVI Use as directed for continuous glucose monitoring - E11.65   Continuous Blood Gluc Sensor (DEXCOM G6 SENSOR) MISC USE AS DIRECTED - CHANGE EVERY 10 DAYS   Continuous Blood Gluc Transmit (DEXCOM G6 TRANSMITTER) MISC USE AS DIRECTED FOR CONTINUOUS GLUCOSE MONITORING.   cyclobenzaprine (FLEXERIL) 10 MG tablet Take 1 tablet (10 mg total) by mouth at bedtime.   ferrous sulfate 325 (65 FE) MG EC tablet Take 1 tablet (325 mg total) by mouth 3 (three) times a week.   linaclotide (LINZESS) 145 MCG CAPS capsule Take 1 capsule (145 mcg total) by mouth daily before breakfast.   metFORMIN (GLUCOPHAGE) 1000 MG tablet TAKE 1 TABLET BY MOUTH TWICE DAILY   MOUNJARO 12.5 MG/0.5ML Pen INJECT 12.'5MG'$   INTO THE SKIN ONCE A WEEK   NOVOLOG 100 UNIT/ML injection USE AS DIRECTED WITH INSULIN PUMP WITH OMNIPOD AND SLIDING SCALE **MAXIMUM OF 50 UNITS PER DAY**   omeprazole (PRILOSEC OTC) 20 MG tablet Take by mouth.   ondansetron (ZOFRAN) 8 MG tablet Take 1 tab po twice a daily as needed for nausea   patiromer (VELTASSA) 8.4 g packet    zolpidem (AMBIEN) 10 MG tablet TAKE 1 TABLET BY MOUTH AT BEDTIME AS NEEDED.   [DISCONTINUED] Diethylpropion HCl CR 75 MG TB24 Take 1 tablet (75 mg total) by mouth daily before breakfast.   Diethylpropion HCl CR 75 MG TB24 Take 1 tablet (75 mg total) by mouth daily before breakfast.   [START ON 04/23/2022] Diethylpropion HCl CR 75 MG TB24 Take 1 tablet (75 mg total) by mouth daily before breakfast.   No facility-administered encounter medications on file as of 03/26/2022.    Surgical History: Past Surgical History:  Procedure Laterality Date   BREAST BIOPSY Right 2012   cleaned out fistula, not a biopsy   CHOLECYSTECTOMY     COLONOSCOPY WITH PROPOFOL N/A 07/07/2018   Procedure: COLONOSCOPY WITH PROPOFOL;  Surgeon: Lollie Sails, MD;  Location[facility]COPY;  Service: Endoscopy;  Laterality: N/A;   COLONOSCOPY WITH PROPOFOL N/A 07/27/2021   Procedure: COLONOSCOPY WITH PROPOFOL;  Surgeon: Annamaria Helling, DO;  Location: [facility] ENDOSCOPY;  Service: Gastroenterology;  Laterality: N/A;   ESOPHAGOGASTRODUODENOSCOPY (EGD) WITH PROPOFOL N/A 07/27/2021   Procedure: ESOPHAGOGASTRODUODENOSCOPY (EGD) WITH PROPOFOL;  Surgeon: Virgina Jock,  Richardo Hanks, DO;  Location: [facility] ENDOSCOPY;  Service: Gastroenterology;  Laterality: N/A;   SHOULDER ARTHROSCOPY      Medical History: Past Medical History:  Diagnosis Date   Diabetes mellitus without complication (HCC)    GERD (gastroesophageal reflux disease)    Hyperlipidemia    Hypertension    IDA (iron deficiency anemia) 01/27/2021    Family History: Family History  Problem Relation Age of Onset   Lung cancer Mother     Colon cancer Father    Breast cancer Neg Hx     Social History   Socioeconomic History   Marital status: Married    Spouse name: Not on file   Number of children: Not on file   Years of education: Not on file   Highest education level: Not on file  Occupational History   Not on file  Tobacco Use   Smoking status: Never   Smokeless tobacco: Never  Vaping Use   Vaping Use: Never used  Substance and Sexual Activity   Alcohol use: Yes    Comment: socially   Drug use: No   Sexual activity: Not on file  Other Topics Concern   Not on file  Social History Narrative   Not on file   Social Determinants of Health   Financial Resource Strain: Not on file  Food Insecurity: Not on file  Transportation Needs: Not on file  Physical Activity: Not on file  Stress: Not on file  Social Connections: Not on file  Intimate Partner Violence: Not on file      Review of Systems  Constitutional:  Negative for chills, fatigue and unexpected weight change.  HENT:  Negative for congestion, rhinorrhea, sneezing and sore throat.   Eyes:  Negative for redness.  Respiratory: Negative.  Negative for cough, chest tightness, shortness of breath and wheezing.   Cardiovascular: Negative.  Negative for chest pain and palpitations.  Gastrointestinal:  Negative for abdominal pain, constipation, diarrhea, nausea and vomiting.  Genitourinary:  Negative for dysuria and frequency.  Musculoskeletal:  Negative for arthralgias, back pain, joint swelling and neck pain.  Skin:  Negative for rash.  Neurological: Negative.  Negative for tremors and numbness.  Hematological:  Negative for adenopathy. Does not bruise/bleed easily.  Psychiatric/Behavioral:  Negative for behavioral problems (Depression), sleep disturbance and suicidal ideas. The patient is not nervous/anxious.     Vital Signs: BP 118/62   Pulse 82   Temp (!) 95.4 F (35.2 C)   Resp 16   Ht '5\' 4"'$  (1.626 m)   Wt 159 lb 6.4 oz (72.3 kg)    SpO2 98%   BMI 27.36 kg/m    Physical Exam Vitals reviewed.  Constitutional:      General: She is not in acute distress.    Appearance: Normal appearance. She is not ill-appearing.  HENT:     Head: Normocephalic and atraumatic.  Eyes:     Pupils: Pupils are equal, round, and reactive to light.  Cardiovascular:     Rate and Rhythm: Normal rate and regular rhythm.  Pulmonary:     Effort: Pulmonary effort is normal. No respiratory distress.  Neurological:     Mental Status: She is alert and oriented to person, place, and time.  Psychiatric:        Mood and Affect: Mood normal.        Behavior: Behavior normal.        Assessment/Plan: 1. Type 2 diabetes mellitus with stage 2 chronic kidney disease, with long-term current  use of insulin (HCC) A1c continues to improve, basal insulin dose was decreased again per patient. Continue current dose of mounjaro and other diabetic medications as prescribed. May consider decreasing or discontinuing some medication at next A1c check. - POCT glycosylated hemoglobin (Hb A1C)  2. Benign hypertension Stable, continue medication as prescribed.   3. Overweight with body mass index (BMI) of 28 to 28.9 in adult 2 months of refills prescribed, with 3 major holidays coming up, will follow up regarding weight loss in January.  - Diethylpropion HCl CR 75 MG TB24; Take 1 tablet (75 mg total) by mouth daily before breakfast.  Dispense: 30 tablet; Refill: 0 - Diethylpropion HCl CR 75 MG TB24; Take 1 tablet (75 mg total) by mouth daily before breakfast.  Dispense: 30 tablet; Refill: 0   General Counseling: darolyn double understanding of the findings of todays visit and agrees with plan of treatment. I have discussed any further diagnostic evaluation that may be needed or ordered today. We also reviewed her medications today. she has been encouraged to call the office with any questions or concerns that should arise related to todays visit.    Orders  Placed This Encounter  Procedures   POCT glycosylated hemoglobin (Hb A1C)    Meds ordered this encounter  Medications   Diethylpropion HCl CR 75 MG TB24    Sig: Take 1 tablet (75 mg total) by mouth daily before breakfast.    Dispense:  30 tablet    Refill:  0   Diethylpropion HCl CR 75 MG TB24    Sig: Take 1 tablet (75 mg total) by mouth daily before breakfast.    Dispense:  30 tablet    Refill:  0    Fill for December.    Return in about 3 months (around 06/29/2022) for for a1c check and also need a 2 month weight loss visit in january with me.   Total time spent:30 Minutes Time spent includes review of chart, medications, test results, and follow up plan with the patient.   [place] Controlled Substance Database was reviewed by me.  This patient was seen by Jonetta Osgood, FNP-C in collaboration with Dr. Clayborn Bigness as a part of collaborative care agreement.   Jonah Gingras R. Valetta Fuller, MSN, FNP-C Internal medicine

## 2022-03-30 ENCOUNTER — Telehealth: Payer: Self-pay

## 2022-03-30 NOTE — Telephone Encounter (Signed)
Sent P.A. for patient's Diethylpropion.

## 2022-04-07 ENCOUNTER — Telehealth: Payer: Self-pay

## 2022-04-07 NOTE — Telephone Encounter (Signed)
Patient's P.A. for Diethylpropion was denied.

## 2022-04-28 ENCOUNTER — Telehealth: Payer: Self-pay | Admitting: Nurse Practitioner

## 2022-04-28 NOTE — Telephone Encounter (Signed)
Lvm to reschedule 07/02/22 appointment-Toni

## 2022-04-30 ENCOUNTER — Encounter: Payer: Self-pay | Admitting: Nurse Practitioner

## 2022-04-30 ENCOUNTER — Telehealth: Payer: Self-pay

## 2022-04-30 ENCOUNTER — Other Ambulatory Visit: Payer: Self-pay

## 2022-04-30 MED ORDER — OSELTAMIVIR PHOSPHATE 75 MG PO CAPS: 75.0000 mg | ORAL_CAPSULE | Freq: Two times a day (BID) | ORAL | 0 refills | Status: DC

## 2022-04-30 NOTE — Telephone Encounter (Signed)
Pt send MyChart message that she exposure to FLU and she having running nose as per alyssa send Tamiflu

## 2022-05-16 ENCOUNTER — Other Ambulatory Visit: Payer: Self-pay | Admitting: Nurse Practitioner

## 2022-05-16 ENCOUNTER — Other Ambulatory Visit: Payer: Self-pay | Admitting: Internal Medicine

## 2022-05-16 DIAGNOSIS — E663 Overweight: Secondary | ICD-10-CM

## 2022-05-16 DIAGNOSIS — F5101 Primary insomnia: Secondary | ICD-10-CM

## 2022-05-17 MED ORDER — ZOLPIDEM TARTRATE 10 MG PO TABS: 10.0000 mg | ORAL_TABLET | Freq: Every evening | ORAL | 0 refills | Status: DC | PRN

## 2022-05-17 MED ORDER — DIETHYLPROPION HCL ER 75 MG PO TB24: 75.0000 mg | ORAL_TABLET | Freq: Every day | ORAL | 0 refills | Status: DC

## 2022-05-18 ENCOUNTER — Encounter: Payer: BC Managed Care – PPO | Admitting: Nurse Practitioner

## 2022-05-21 ENCOUNTER — Ambulatory Visit: Payer: BC Managed Care – PPO | Admitting: Nurse Practitioner

## 2022-05-27 ENCOUNTER — Encounter: Payer: BC Managed Care – PPO | Admitting: Nurse Practitioner

## 2022-06-10 ENCOUNTER — Other Ambulatory Visit: Payer: Self-pay | Admitting: Nurse Practitioner

## 2022-06-10 DIAGNOSIS — F5101 Primary insomnia: Secondary | ICD-10-CM

## 2022-06-10 NOTE — Telephone Encounter (Signed)
Last12/23 next2/26/24

## 2022-06-26 ENCOUNTER — Encounter: Payer: Self-pay | Admitting: Nurse Practitioner

## 2022-06-28 MED ORDER — SEMAGLUTIDE (2 MG/DOSE) 8 MG/3ML ~~LOC~~ SOPN: 2.0000 mg | PEN_INJECTOR | SUBCUTANEOUS | 3 refills | Status: DC

## 2022-07-02 ENCOUNTER — Ambulatory Visit: Payer: BC Managed Care – PPO | Admitting: Nurse Practitioner

## 2022-07-05 ENCOUNTER — Ambulatory Visit (INDEPENDENT_AMBULATORY_CARE_PROVIDER_SITE_OTHER): Payer: BC Managed Care – PPO | Admitting: Nurse Practitioner

## 2022-07-05 ENCOUNTER — Other Ambulatory Visit: Payer: Self-pay

## 2022-07-05 ENCOUNTER — Encounter: Payer: Self-pay | Admitting: Nurse Practitioner

## 2022-07-05 ENCOUNTER — Telehealth: Payer: Self-pay

## 2022-07-05 VITALS — BP 137/67 | HR 85 | Temp 97.0°F | Resp 16 | Ht 64.0 in | Wt 151.0 lb

## 2022-07-05 DIAGNOSIS — N182 Chronic kidney disease, stage 2 (mild): Secondary | ICD-10-CM | POA: Diagnosis not present

## 2022-07-05 DIAGNOSIS — I10 Essential (primary) hypertension: Secondary | ICD-10-CM

## 2022-07-05 DIAGNOSIS — Z794 Long term (current) use of insulin: Secondary | ICD-10-CM

## 2022-07-05 DIAGNOSIS — Z6825 Body mass index (BMI) 25.0-25.9, adult: Secondary | ICD-10-CM

## 2022-07-05 DIAGNOSIS — E663 Overweight: Secondary | ICD-10-CM | POA: Diagnosis not present

## 2022-07-05 DIAGNOSIS — E1122 Type 2 diabetes mellitus with diabetic chronic kidney disease: Secondary | ICD-10-CM

## 2022-07-05 DIAGNOSIS — Z6828 Body mass index (BMI) 28.0-28.9, adult: Secondary | ICD-10-CM

## 2022-07-05 MED ORDER — DIETHYLPROPION HCL ER 75 MG PO TB24: 75.0000 mg | ORAL_TABLET | Freq: Every day | ORAL | 0 refills | Status: DC

## 2022-07-05 NOTE — Progress Notes (Signed)
[facility] [place], [place] [address]  Internal MEDICINE  Office Visit Note  Patient Name: Chelsea Rivera  [id number]  [id number]  Date of Service: 07/05/2022  Chief Complaint  Patient presents with   Diabetes   Hypertension   Hyperlipidemia   Follow-up    HPI Chelsea Rivera presents for a follow up visit for diabetes Daily Basal insulin dose 13.8 units daily. Bolus dosing is less than 20 units per day.  Blood pressure is stable.  Lost 8 lb since last visit, within 5 lbs of patient personal goal weight.  Plans to start taking diethylpropion every other day instead of daily Had to switch to ozempic 2 mg due to [employer] backorder on 12.5 mg and 15 mg doses.  Will schedule routine mammogram this week.     Current Medication: Outpatient Encounter Medications as of 07/05/2022  Medication Sig   aspirin EC 81 MG tablet Take 81 mg by mouth daily.   atorvastatin (LIPITOR) 10 MG tablet TAKE 1/2 TABLET EVERYDAY FOR HIGH CHOLESTEROL   bisoprolol (ZEBETA) 5 MG tablet Take 1 tablet (5 mg total) by mouth at bedtime.   clotrimazole-betamethasone (LOTRISONE) cream Apply 1 application topically daily.   Continuous Blood Gluc Receiver (DEXCOM G6 RECEIVER) DEVI Use as directed for continuous glucose monitoring - E11.65   Continuous Blood Gluc Sensor (DEXCOM G6 SENSOR) MISC USE AS DIRECTED - CHANGE EVERY 10 DAYS   Continuous Blood Gluc Transmit (DEXCOM G6 TRANSMITTER) MISC USE AS DIRECTED FOR CONTINUOUS GLUCOSE MONITORING.   cyclobenzaprine (FLEXERIL) 10 MG tablet Take 1 tablet (10 mg total) by mouth at bedtime.   ferrous sulfate 325 (65 FE) MG EC tablet Take 1 tablet (325 mg total) by mouth 3 (three) times a week.   linaclotide (LINZESS) 145 MCG CAPS capsule Take 1 capsule (145 mcg total) by mouth daily before breakfast.   metFORMIN (GLUCOPHAGE) 1000 MG tablet TAKE 1 TABLET BY MOUTH TWICE DAILY   MOUNJARO 12.5 MG/0.5ML Pen INJECT 12.'5MG'$  INTO THE SKIN ONCE A WEEK    NOVOLOG 100 UNIT/ML injection USE AS DIRECTED WITH INSULIN PUMP WITH OMNIPOD AND SLIDING SCALE **MAXIMUM OF 50 UNITS PER DAY**   omeprazole (PRILOSEC OTC) 20 MG tablet Take by mouth.   ondansetron (ZOFRAN) 8 MG tablet Take 1 tab po twice a daily as needed for nausea   oseltamivir (TAMIFLU) 75 MG capsule Take 1 capsule (75 mg total) by mouth 2 (two) times daily.   patiromer (VELTASSA) 8.4 g packet    Semaglutide, 2 MG/DOSE, 8 MG/3ML SOPN Inject 2 mg into the skin once a week.   zolpidem (AMBIEN) 10 MG tablet TAKE 1 TABLET BY MOUTH AT BEDTIME AS NEEDED   [DISCONTINUED] Diethylpropion HCl CR 75 MG TB24 Take 1 tablet (75 mg total) by mouth daily before breakfast.   Diethylpropion HCl CR 75 MG TB24 Take 1 tablet (75 mg total) by mouth daily before breakfast.   [DISCONTINUED] Diethylpropion HCl CR 75 MG TB24 Take 1 tablet (75 mg total) by mouth daily before breakfast.   No facility-administered encounter medications on file as of 07/05/2022.    Surgical History: Past Surgical History:  Procedure Laterality Date   BREAST BIOPSY Right 2012   cleaned out fistula, not a biopsy   CHOLECYSTECTOMY     COLONOSCOPY WITH PROPOFOL N/A 07/07/2018   Procedure: COLONOSCOPY WITH PROPOFOL;  Surgeon: Lollie Sails, MD;  Location: [facility] ENDOSCOPY;  Service: Endoscopy;  Laterality: N/A;   COLONOSCOPY WITH PROPOFOL N/A 07/27/2021  Procedure: COLONOSCOPY WITH PROPOFOL;  Surgeon: Annamaria Helling, DO;  Location: [facility] ENDOSCOPY;  Service: Gastroenterology;  Laterality: N/A;   ESOPHAGOGASTRODUODENOSCOPY (EGD) WITH PROPOFOL N/A 07/27/2021   Procedure: ESOPHAGOGASTRODUODENOSCOPY (EGD) WITH PROPOFOL;  Surgeon: Annamaria Helling, DO;  Location: [place];  Service: Gastroenterology;  Laterality: N/A;   SHOULDER ARTHROSCOPY      Medical History: Past Medical History:  Diagnosis Date   Diabetes mellitus without complication (HCC)    GERD (gastroesophageal reflux disease)    Hyperlipidemia     Hypertension    IDA (iron deficiency anemia) 01/27/2021    Family History: Family History  Problem Relation Age of Onset   Lung cancer Mother    Colon cancer Father    Breast cancer Neg Hx     Social History   Socioeconomic History   Marital status: Married    Spouse name: Not on file   Number of children: Not on file   Years of education: Not on file   Highest education level: Not on file  Occupational History   Not on file  Tobacco Use   Smoking status: Never   Smokeless tobacco: Never  Vaping Use   Vaping Use: Never used  Substance and Sexual Activity   Alcohol use: Yes    Comment: socially   Drug use: No   Sexual activity: Not on file  Other Topics Concern   Not on file  Social History Narrative   Not on file   Social Determinants of Health   Financial Resource Strain: Not on file  Food Insecurity: Not on file  Transportation Needs: Not on file  Physical Activity: Not on file  Stress: Not on file  Social Connections: Not on file  Intimate Partner Violence: Not on file      Review of Systems  Constitutional:  Negative for chills, fatigue and unexpected weight change.  HENT:  Negative for congestion, rhinorrhea, sneezing and sore throat.   Eyes:  Negative for redness.  Respiratory: Negative.  Negative for cough, chest tightness, shortness of breath and wheezing.   Cardiovascular: Negative.  Negative for chest pain and palpitations.  Gastrointestinal:  Negative for abdominal pain, constipation, diarrhea, nausea and vomiting.  Genitourinary:  Negative for dysuria and frequency.  Musculoskeletal:  Negative for arthralgias, back pain, joint swelling and neck pain.  Skin:  Negative for rash.  Neurological: Negative.  Negative for tremors and numbness.  Hematological:  Negative for adenopathy. Does not bruise/bleed easily.  Psychiatric/Behavioral:  Negative for behavioral problems (Depression), sleep disturbance and suicidal ideas. The patient is not  nervous/anxious.     Vital Signs: BP 137/67   Pulse 85   Temp (!) 97 F (36.1 C)   Resp 16   Ht '5\' 4"'$  (1.626 m)   Wt 151 lb (68.5 kg)   SpO2 99%   BMI 25.92 kg/m    Physical Exam Vitals reviewed.  Constitutional:      General: She is not in acute distress.    Appearance: Normal appearance. She is not ill-appearing.  HENT:     Head: Normocephalic and atraumatic.  Eyes:     Pupils: Pupils are equal, round, and reactive to light.  Cardiovascular:     Rate and Rhythm: Normal rate and regular rhythm.  Pulmonary:     Effort: Pulmonary effort is normal. No respiratory distress.  Neurological:     Mental Status: She is alert and oriented to person, place, and time.  Psychiatric:        Mood  and Affect: Mood normal.        Behavior: Behavior normal.        Assessment/Plan: 1. Type 2 diabetes mellitus with stage 2 chronic kidney disease, with long-term current use of insulin ([place]) Continue omnipod and ozempic as prescribed. Ozempic is being used while higher doses of mounjaro are on Psychologist, prison and probation services. Omnipod basal insulin dose has been decreased further. Patient is still losing weight. Follow up in 1 month  2. Benign hypertension Stable, continue medications as prescribed  3. Overweight with body mass index (BMI) of 25 to 25.9 in adult Called in verbal order for diethylpropion x1 month script. Still losing weight, close to patient goal weight. Continue diethylpropion, follow up in 1 month.    General Counseling: mattox harps understanding of the findings of todays visit and agrees with plan of treatment. I have discussed any further diagnostic evaluation that may be needed or ordered today. We also reviewed her medications today. she has been encouraged to call the office with any questions or concerns that should arise related to todays visit.    No orders of the defined types were placed in this encounter.   Meds ordered this encounter  Medications    DISCONTD: Diethylpropion HCl CR 75 MG TB24    Sig: Take 1 tablet (75 mg total) by mouth daily before breakfast.    Dispense:  50 tablet    Refill:  0    Refill until appt on 2/26   Diethylpropion HCl CR 75 MG TB24    Sig: Take 1 tablet (75 mg total) by mouth daily before breakfast.    Dispense:  30 tablet    Refill:  0    Return in about 4 weeks (around 08/02/2022) for F/U, Weight loss, Chibuikem Thang PCP.   Total time spent:30 Minutes Time spent includes review of chart, medications, test results, and follow up plan with the patient.   [place] Controlled Substance Database was reviewed by me.  This patient was seen by Jonetta Osgood, FNP-C in collaboration with Dr. Clayborn Bigness as a part of collaborative care agreement.   Michole Lecuyer R. Valetta Fuller, MSN, FNP-C Internal medicine

## 2022-07-05 NOTE — Telephone Encounter (Signed)
Called in [facility] phar for Diethylpropion HCL CR 75 mg 1 tab po daily #30 with no refills

## 2022-07-20 ENCOUNTER — Other Ambulatory Visit: Payer: Self-pay | Admitting: Nurse Practitioner

## 2022-07-21 ENCOUNTER — Other Ambulatory Visit: Payer: Self-pay

## 2022-07-21 MED ORDER — METFORMIN HCL 1000 MG PO TABS: 1000.0000 mg | ORAL_TABLET | Freq: Two times a day (BID) | ORAL | 3 refills | Status: DC

## 2022-07-23 ENCOUNTER — Encounter: Payer: Self-pay | Admitting: Nurse Practitioner

## 2022-07-23 DIAGNOSIS — E1122 Type 2 diabetes mellitus with diabetic chronic kidney disease: Secondary | ICD-10-CM

## 2022-07-26 ENCOUNTER — Other Ambulatory Visit: Payer: Self-pay | Admitting: Nurse Practitioner

## 2022-07-26 ENCOUNTER — Telehealth: Payer: Self-pay

## 2022-07-26 DIAGNOSIS — F5101 Primary insomnia: Secondary | ICD-10-CM

## 2022-07-26 MED ORDER — ZOLPIDEM TARTRATE 10 MG PO TABS: 10.0000 mg | ORAL_TABLET | Freq: Every evening | ORAL | 0 refills | Status: DC | PRN

## 2022-07-26 MED ORDER — MOUNJARO 12.5 MG/0.5ML ~~LOC~~ SOAJ: SUBCUTANEOUS | 1 refills | Status: DC

## 2022-07-26 NOTE — Telephone Encounter (Signed)
Spoke with she will call [facility] and check with if [facility] backorder then she will send [employer]

## 2022-07-26 NOTE — Telephone Encounter (Signed)
This dose is backorder

## 2022-07-27 ENCOUNTER — Telehealth: Payer: Self-pay

## 2022-07-28 NOTE — Telephone Encounter (Signed)
As per Chelsea Rivera pt already got it

## 2022-07-30 ENCOUNTER — Encounter: Payer: Self-pay | Admitting: Nurse Practitioner

## 2022-07-30 ENCOUNTER — Other Ambulatory Visit: Payer: Self-pay | Admitting: Nurse Practitioner

## 2022-07-30 DIAGNOSIS — R110 Nausea: Secondary | ICD-10-CM

## 2022-07-31 ENCOUNTER — Other Ambulatory Visit: Payer: Self-pay | Admitting: Nurse Practitioner

## 2022-07-31 MED ORDER — MOUNJARO 12.5 MG/0.5ML ~~LOC~~ SOAJ: SUBCUTANEOUS | 1 refills | Status: DC

## 2022-08-02 NOTE — Telephone Encounter (Signed)
Please do PA

## 2022-08-10 ENCOUNTER — Ambulatory Visit: Payer: BC Managed Care – PPO | Admitting: Nurse Practitioner

## 2022-08-19 ENCOUNTER — Other Ambulatory Visit: Payer: Self-pay | Admitting: Nurse Practitioner

## 2022-08-20 ENCOUNTER — Ambulatory Visit (INDEPENDENT_AMBULATORY_CARE_PROVIDER_SITE_OTHER): Payer: BC Managed Care – PPO | Admitting: Nurse Practitioner

## 2022-08-20 ENCOUNTER — Other Ambulatory Visit: Payer: Self-pay | Admitting: Internal Medicine

## 2022-08-20 ENCOUNTER — Encounter: Payer: Self-pay | Admitting: Nurse Practitioner

## 2022-08-20 VITALS — BP 136/70 | HR 80 | Temp 97.5°F | Resp 16 | Ht 64.0 in | Wt 151.2 lb

## 2022-08-20 DIAGNOSIS — E663 Overweight: Secondary | ICD-10-CM

## 2022-08-20 DIAGNOSIS — Z1231 Encounter for screening mammogram for malignant neoplasm of breast: Secondary | ICD-10-CM

## 2022-08-20 DIAGNOSIS — E1122 Type 2 diabetes mellitus with diabetic chronic kidney disease: Secondary | ICD-10-CM

## 2022-08-20 DIAGNOSIS — I10 Essential (primary) hypertension: Secondary | ICD-10-CM | POA: Diagnosis not present

## 2022-08-20 DIAGNOSIS — Z794 Long term (current) use of insulin: Secondary | ICD-10-CM

## 2022-08-20 DIAGNOSIS — Z6825 Body mass index (BMI) 25.0-25.9, adult: Secondary | ICD-10-CM

## 2022-08-20 DIAGNOSIS — N182 Chronic kidney disease, stage 2 (mild): Secondary | ICD-10-CM | POA: Diagnosis not present

## 2022-08-20 MED ORDER — DIETHYLPROPION HCL ER 75 MG PO TB24: 75.0000 mg | ORAL_TABLET | Freq: Every day | ORAL | 0 refills | Status: DC

## 2022-08-20 MED ORDER — DIETHYLPROPION HCL ER 75 MG PO TB24
75.0000 mg | ORAL_TABLET | Freq: Every day | ORAL | 0 refills | Status: DC
Start: 2022-09-17 — End: 2022-09-20

## 2022-08-20 NOTE — Progress Notes (Signed)
[facility] [address] [place], [place] [address]  Internal MEDICINE  Office Visit Note  Patient Name: Chelsea Rivera  [id number]  [id number]  Date of Service: 08/20/2022  Chief Complaint  Patient presents with   Follow-up    Weight loss     HPI Chelsea Rivera presents for a follow-up visit for weight loss, diabetes  Weight loss -- no change in weight, no increase either. Wants to continue diethylpropion.  Diabetes -- stable, had to adjust her insulin dose slightly due to having to switch from mounjaro to ozempic due to backorder but she just received her mounjaro from mail order.  Hypertension -- well controlled with current medication.     Current Medication: Outpatient Encounter Medications as of 08/20/2022  Medication Sig   aspirin EC 81 MG tablet Take 81 mg by mouth daily.   atorvastatin (LIPITOR) 10 MG tablet TAKE 1/2 TABLET EVERYDAY FOR HIGH CHOLESTEROL   bisoprolol (ZEBETA) 5 MG tablet Take 1 tablet (5 mg total) by mouth at bedtime.   clotrimazole-betamethasone (LOTRISONE) cream Apply 1 application topically daily.   Continuous Blood Gluc Receiver (DEXCOM G6 RECEIVER) DEVI Use as directed for continuous glucose monitoring - E11.65   Continuous Blood Gluc Sensor (DEXCOM G6 SENSOR) MISC USE AS DIRECTED - CHANGE EVERY 10 DAYS   Continuous Blood Gluc Transmit (DEXCOM G6 TRANSMITTER) MISC USE AS DIRECTED FOR CONTINUOUS GLUCOSE MONITORING.   cyclobenzaprine (FLEXERIL) 10 MG tablet Take 1 tablet (10 mg total) by mouth at bedtime.   ferrous sulfate 325 (65 FE) MG EC tablet Take 1 tablet (325 mg total) by mouth 3 (three) times a week.   linaclotide (LINZESS) 145 MCG CAPS capsule Take 1 capsule (145 mcg total) by mouth daily before breakfast.   metFORMIN (GLUCOPHAGE) 1000 MG tablet Take 1 tablet (1,000 mg total) by mouth 2 (two) times daily.   NOVOLOG 100 UNIT/ML injection USE AS DIRECTED WITH INSULIN PUMP WITH OMNIPOD AND SLIDING SCALE **MAXIMUM OF 50 UNITS PER DAY**    omeprazole (PRILOSEC OTC) 20 MG tablet Take by mouth.   ondansetron (ZOFRAN) 8 MG tablet TAKE ONE TABLET BY MOUTH TWICE DAILY AS NEEDED FOR NAUSEA   oseltamivir (TAMIFLU) 75 MG capsule Take 1 capsule (75 mg total) by mouth 2 (two) times daily.   patiromer (VELTASSA) 8.4 g packet    Semaglutide, 2 MG/DOSE, 8 MG/3ML SOPN Inject 2 mg into the skin once a week.   tirzepatide (MOUNJARO) 12.5 MG/0.5ML Pen PLEASE UPDATE FOR ANY MEDICATION CHANGE   zolpidem (AMBIEN) 10 MG tablet Take 1 tablet (10 mg total) by mouth at bedtime as needed.   [DISCONTINUED] Diethylpropion HCl CR 75 MG TB24 Take 1 tablet (75 mg total) by mouth daily before breakfast.   Diethylpropion HCl CR 75 MG TB24 Take 1 tablet (75 mg total) by mouth daily before breakfast.   [START ON 09/17/2022] Diethylpropion HCl CR 75 MG TB24 Take 1 tablet (75 mg total) by mouth daily before breakfast.   No facility-administered encounter medications on file as of 08/20/2022.    Surgical History: Past Surgical History:  Procedure Laterality Date   BREAST BIOPSY Right 2012   cleaned out fistula, not a biopsy   CHOLECYSTECTOMY     COLONOSCOPY WITH PROPOFOL N/A 07/07/2018   Procedure: COLONOSCOPY WITH PROPOFOL;  Surgeon: Christena Deem, MD;  Location: [facility] ENDOSCOPY;  Service: Endoscopy;  Laterality: N/A;   COLONOSCOPY WITH PROPOFOL N/A 07/27/2021   Procedure: COLONOSCOPY WITH PROPOFOL;  Surgeon: Jaynie Collins, DO;  Location:  [facility] ENDOSCOPY;  Service: Gastroenterology;  Laterality: N/A;   ESOPHAGOGASTRODUODENOSCOPY (EGD) WITH PROPOFOL N/A 07/27/2021   Procedure: ESOPHAGOGASTRODUODENOSCOPY (EGD) WITH PROPOFOL;  Surgeon: Jaynie Collins, DO;  Location: [facility] ENDOSCOPY;  Service: Gastroenterology;  Laterality: N/A;   SHOULDER ARTHROSCOPY      Medical History: Past Medical History:  Diagnosis Date   Diabetes mellitus without complication    GERD (gastroesophageal reflux disease)    Hyperlipidemia    Hypertension    IDA (iron  deficiency anemia) 01/27/2021    Family History: Family History  Problem Relation Age of Onset   Lung cancer Mother    Colon cancer Father    Breast cancer Neg Hx     Social History   Socioeconomic History   Marital status: Married    Spouse name: Not on file   Number of children: Not on file   Years of education: Not on file   Highest education level: Not on file  Occupational History   Not on file  Tobacco Use   Smoking status: Never   Smokeless tobacco: Never  Vaping Use   Vaping Use: Never used  Substance and Sexual Activity   Alcohol use: Yes    Comment: socially   Drug use: No   Sexual activity: Not on file  Other Topics Concern   Not on file  Social History Narrative   Not on file   Social Determinants of Health   Financial Resource Strain: Not on file  Food Insecurity: Not on file  Transportation Needs: Not on file  Physical Activity: Not on file  Stress: Not on file  Social Connections: Not on file  Intimate Partner Violence: Not on file      Review of Systems  Constitutional:  Negative for chills, fatigue and unexpected weight change.  HENT:  Negative for congestion, rhinorrhea, sneezing and sore throat.   Eyes:  Negative for redness.  Respiratory: Negative.  Negative for cough, chest tightness, shortness of breath and wheezing.   Cardiovascular: Negative.  Negative for chest pain and palpitations.  Gastrointestinal:  Negative for abdominal pain, constipation, diarrhea, nausea and vomiting.  Genitourinary:  Negative for dysuria and frequency.  Musculoskeletal:  Negative for arthralgias, back pain, joint swelling and neck pain.  Skin:  Negative for rash.  Neurological: Negative.  Negative for tremors and numbness.  Hematological:  Negative for adenopathy. Does not bruise/bleed easily.  Psychiatric/Behavioral:  Negative for behavioral problems (Depression), sleep disturbance and suicidal ideas. The patient is not nervous/anxious.     Vital  Signs: BP 136/70   Pulse 80   Temp (!) 97.5 F (36.4 C)   Resp 16   Ht  (1.626 m)   Wt 151 lb 3.2 oz (68.6 kg)   SpO2 98%   BMI 25.95 kg/m    Physical Exam Vitals reviewed.  Constitutional:      General: She is not in acute distress.    Appearance: Normal appearance. She is not ill-appearing.  HENT:     Head: Normocephalic and atraumatic.  Eyes:     Pupils: Pupils are equal, round, and reactive to light.  Cardiovascular:     Rate and Rhythm: Normal rate and regular rhythm.  Pulmonary:     Effort: Pulmonary effort is normal. No respiratory distress.  Neurological:     Mental Status: She is alert and oriented to person, place, and time.  Psychiatric:        Mood and Affect: Mood normal.  Behavior: Behavior normal.        Assessment/Plan: 1. Type 2 diabetes mellitus with stage 2 chronic kidney disease, with long-term current use of insulin Stable, will check A1c again in about 5 months. Continue omnipod as instructed, adjust insulin dose as needed per glucose readings.   2. Benign hypertension Stable, continue medications as prescribed.   3. Overweight with body mass index (BMI) of 25 to 25.9 in adult Continue diethylpropion for 8 weeks then follow up in office to reevaluate weight and get refills.  - Diethylpropion HCl CR 75 MG TB24; Take 1 tablet (75 mg total) by mouth daily before breakfast.  Dispense: 30 tablet; Refill: 0 - Diethylpropion HCl CR 75 MG TB24; Take 1 tablet (75 mg total) by mouth daily before breakfast.  Dispense: 30 tablet; Refill: 0   General Counseling: zaylyn sellards understanding of the findings of todays visit and agrees with plan of treatment. I have discussed any further diagnostic evaluation that may be needed or ordered today. We also reviewed her medications today. she has been encouraged to call the office with any questions or concerns that should arise related to todays visit.    No orders of the defined types were placed  in this encounter.   Meds ordered this encounter  Medications   Diethylpropion HCl CR 75 MG TB24    Sig: Take 1 tablet (75 mg total) by mouth daily before breakfast.    Dispense:  30 tablet    Refill:  0    Fill now   Diethylpropion HCl CR 75 MG TB24    Sig: Take 1 tablet (75 mg total) by mouth daily before breakfast.    Dispense:  30 tablet    Refill:  0    Fill now    Return in about 8 weeks (around 10/15/2022) for F/U, Weight loss, Satoshi Kalas PCP.   Total time spent:30 Minutes Time spent includes review of chart, medications, test results, and follow up plan with the patient.   [place] Controlled Substance Database was reviewed by me.  This patient was seen by Sallyanne Kuster, FNP-C in collaboration with Dr. Beverely Risen as a part of collaborative care agreement.   Halana Deisher R. Tedd Sias, MSN, FNP-C Internal medicine

## 2022-08-27 ENCOUNTER — Telehealth: Payer: Self-pay

## 2022-08-27 NOTE — Telephone Encounter (Signed)
Completed P.A. for patient's Diethylpropion.

## 2022-09-17 ENCOUNTER — Ambulatory Visit
Admission: RE | Admit: 2022-09-17 | Discharge: 2022-09-17 | Disposition: A | Discharge status: Home / Self Care | Payer: BC Managed Care – PPO | Source: Ambulatory Visit | Attending: Internal Medicine | Admitting: Internal Medicine

## 2022-09-17 DIAGNOSIS — Z1231 Encounter for screening mammogram for malignant neoplasm of breast: Secondary | ICD-10-CM | POA: Insufficient documentation

## 2022-09-18 ENCOUNTER — Other Ambulatory Visit: Payer: Self-pay | Admitting: Nurse Practitioner

## 2022-09-18 DIAGNOSIS — Z6825 Body mass index (BMI) 25.0-25.9, adult: Secondary | ICD-10-CM

## 2022-09-22 ENCOUNTER — Telehealth: Payer: Self-pay

## 2022-09-22 NOTE — Telephone Encounter (Signed)
Faxed [facility] for OMNIPOD  and see media for pres

## 2022-09-24 ENCOUNTER — Telehealth: Payer: Self-pay

## 2022-09-24 NOTE — Telephone Encounter (Signed)
Completed P.A. for patient's Diethylpropion. 

## 2022-10-12 ENCOUNTER — Other Ambulatory Visit (HOSPITAL_BASED_OUTPATIENT_CLINIC_OR_DEPARTMENT_OTHER): Payer: Self-pay

## 2022-10-18 ENCOUNTER — Encounter: Payer: Self-pay | Admitting: Nurse Practitioner

## 2022-10-18 ENCOUNTER — Ambulatory Visit (INDEPENDENT_AMBULATORY_CARE_PROVIDER_SITE_OTHER): Payer: BC Managed Care – PPO | Admitting: Nurse Practitioner

## 2022-10-18 VITALS — BP 126/74 | HR 75 | Temp 97.5°F | Resp 16 | Ht 64.0 in | Wt 147.8 lb

## 2022-10-18 DIAGNOSIS — E1122 Type 2 diabetes mellitus with diabetic chronic kidney disease: Secondary | ICD-10-CM | POA: Diagnosis not present

## 2022-10-18 DIAGNOSIS — N182 Chronic kidney disease, stage 2 (mild): Secondary | ICD-10-CM | POA: Diagnosis not present

## 2022-10-18 DIAGNOSIS — Z794 Long term (current) use of insulin: Secondary | ICD-10-CM

## 2022-10-18 DIAGNOSIS — I10 Essential (primary) hypertension: Secondary | ICD-10-CM | POA: Diagnosis not present

## 2022-10-18 DIAGNOSIS — E663 Overweight: Secondary | ICD-10-CM | POA: Diagnosis not present

## 2022-10-18 DIAGNOSIS — Z6825 Body mass index (BMI) 25.0-25.9, adult: Secondary | ICD-10-CM

## 2022-10-18 LAB — POCT GLYCOSYLATED HEMOGLOBIN (HGB A1C): Hemoglobin A1C: 5.8 % — AB (ref 4.0–5.6)

## 2022-10-18 MED ORDER — MOUNJARO 12.5 MG/0.5ML ~~LOC~~ SOAJ
SUBCUTANEOUS | 1 refills | Status: DC
Start: 2022-10-18 — End: 2023-01-04

## 2022-10-18 MED ORDER — DIETHYLPROPION HCL ER 75 MG PO TB24: ORAL_TABLET | ORAL | 2 refills | Status: DC

## 2022-10-18 NOTE — Progress Notes (Signed)
[facility] [address] [place], [place] [address]  Internal MEDICINE  Office Visit Note  Patient Name: Chelsea Rivera  [id number]  [id number]  Date of Service: 10/18/2022  Chief Complaint  Patient presents with   Diabetes   Gastroesophageal Reflux   Hypertension   Hyperlipidemia   Follow-up    Weight loss     HPI Chelsea Rivera presents for a follow-up visit for diabetes and weight loss Diabetes -- A1c is 5.8 today, improved from previous. basal insulin and bolus =-15-16 units daily.  Weight loss -- lost 4 more lbs, still taking diethylpropion.  Hypertension -- controlled with current medication   Current Medication: Outpatient Encounter Medications as of 10/18/2022  Medication Sig   aspirin EC 81 MG tablet Take 81 mg by mouth daily.   atorvastatin (LIPITOR) 10 MG tablet TAKE 1/2 TABLET EVERYDAY FOR HIGH CHOLESTEROL   bisoprolol (ZEBETA) 5 MG tablet Take 1 tablet (5 mg total) by mouth at bedtime.   clotrimazole-betamethasone (LOTRISONE) cream Apply 1 application topically daily.   Continuous Blood Gluc Receiver (DEXCOM G6 RECEIVER) DEVI Use as directed for continuous glucose monitoring - E11.65   Continuous Blood Gluc Sensor (DEXCOM G6 SENSOR) MISC USE AS DIRECTED - CHANGE EVERY 10 DAYS   Continuous Blood Gluc Transmit (DEXCOM G6 TRANSMITTER) MISC USE AS DIRECTED FOR CONTINUOUS GLUCOSE MONITORING.   cyclobenzaprine (FLEXERIL) 10 MG tablet Take 1 tablet (10 mg total) by mouth at bedtime.   Diethylpropion HCl CR 75 MG TB24 Take 1 tablet (75 mg total) by mouth daily before breakfast.   ferrous sulfate 325 (65 FE) MG EC tablet Take 1 tablet (325 mg total) by mouth 3 (three) times a week.   linaclotide (LINZESS) 145 MCG CAPS capsule Take 1 capsule (145 mcg total) by mouth daily before breakfast.   metFORMIN (GLUCOPHAGE) 1000 MG tablet Take 1 tablet (1,000 mg total) by mouth 2 (two) times daily.   NOVOLOG 100 UNIT/ML injection USE AS DIRECTED WITH INSULIN PUMP WITH OMNIPOD  AND SLIDING SCALE **MAXIMUM OF 50 UNITS PER DAY**   omeprazole (PRILOSEC OTC) 20 MG tablet Take by mouth.   ondansetron (ZOFRAN) 8 MG tablet TAKE ONE TABLET BY MOUTH TWICE DAILY AS NEEDED FOR NAUSEA   patiromer (VELTASSA) 8.4 g packet    zolpidem (AMBIEN) 10 MG tablet Take 1 tablet (10 mg total) by mouth at bedtime as needed.   [DISCONTINUED] Diethylpropion HCl CR 75 MG TB24 TAKE ONE TABLET EVERY DAY BEFORE BREAKFAST   [DISCONTINUED] oseltamivir (TAMIFLU) 75 MG capsule Take 1 capsule (75 mg total) by mouth 2 (two) times daily.   [DISCONTINUED] Semaglutide, 2 MG/DOSE, 8 MG/3ML SOPN Inject 2 mg into the skin once a week.   [DISCONTINUED] tirzepatide (MOUNJARO) 12.5 MG/0.5ML Pen PLEASE UPDATE FOR ANY MEDICATION CHANGE   Diethylpropion HCl CR 75 MG TB24 TAKE ONE TABLET EVERY DAY BEFORE BREAKFAST   tirzepatide (MOUNJARO) 12.5 MG/0.5ML Pen PLEASE UPDATE FOR ANY MEDICATION CHANGE   No facility-administered encounter medications on file as of 10/18/2022.    Surgical History: Past Surgical History:  Procedure Laterality Date   BREAST BIOPSY Right 2012   cleaned out fistula, not a biopsy   CHOLECYSTECTOMY     COLONOSCOPY WITH PROPOFOL N/A 07/07/2018   Procedure: COLONOSCOPY WITH PROPOFOL;  Surgeon: Christena Deem, MD;  Location: [facility] ENDOSCOPY;  Service: Endoscopy;  Laterality: N/A;   COLONOSCOPY WITH PROPOFOL N/A 07/27/2021   Procedure: COLONOSCOPY WITH PROPOFOL;  Surgeon: Jaynie Collins, DO;  Location: [facility] ENDOSCOPY;  Service:  Gastroenterology;  Laterality: N/A;   ESOPHAGOGASTRODUODENOSCOPY (EGD) WITH PROPOFOL N/A 07/27/2021   Procedure: ESOPHAGOGASTRODUODENOSCOPY (EGD) WITH PROPOFOL;  Surgeon: Jaynie Collins, DO;  Location: [facility] ENDOSCOPY;  Service: Gastroenterology;  Laterality: N/A;   SHOULDER ARTHROSCOPY      Medical History: Past Medical History:  Diagnosis Date   Diabetes mellitus without complication (HCC)    GERD (gastroesophageal reflux disease)    Hyperlipidemia     Hypertension    IDA (iron deficiency anemia) 01/27/2021    Family History: Family History  Problem Relation Age of Onset   Lung cancer Mother    Colon cancer Father    Breast cancer Neg Hx     Social History   Socioeconomic History   Marital status: Married    Spouse name: Not on file   Number of children: Not on file   Years of education: Not on file   Highest education level: Not on file  Occupational History   Not on file  Tobacco Use   Smoking status: Never   Smokeless tobacco: Never  Vaping Use   Vaping Use: Never used  Substance and Sexual Activity   Alcohol use: Yes    Comment: socially   Drug use: No   Sexual activity: Not on file  Other Topics Concern   Not on file  Social History Narrative   Not on file   Social Determinants of Health   Financial Resource Strain: Not on file  Food Insecurity: Not on file  Transportation Needs: Not on file  Physical Activity: Not on file  Stress: Not on file  Social Connections: Not on file  Intimate Partner Violence: Not on file      Review of Systems  Constitutional:  Negative for chills, fatigue and unexpected weight change.  HENT:  Negative for congestion, rhinorrhea, sneezing and sore throat.   Eyes:  Negative for redness.  Respiratory: Negative.  Negative for cough, chest tightness, shortness of breath and wheezing.   Cardiovascular: Negative.  Negative for chest pain and palpitations.  Gastrointestinal:  Negative for abdominal pain, constipation, diarrhea, nausea and vomiting.  Genitourinary:  Negative for dysuria and frequency.  Musculoskeletal:  Negative for arthralgias, back pain, joint swelling and neck pain.  Skin:  Negative for rash.  Neurological: Negative.  Negative for tremors and numbness.  Hematological:  Negative for adenopathy. Does not bruise/bleed easily.  Psychiatric/Behavioral:  Negative for behavioral problems (Depression), sleep disturbance and suicidal ideas. The patient is not  nervous/anxious.     Vital Signs: BP 126/74   Pulse 75   Temp (!) 97.5 F (36.4 C)   Resp 16   Ht 5\' 4"  (1.626 m)   Wt 147 lb 12.8 oz (67 kg)   SpO2 97%   BMI 25.37 kg/m    Physical Exam Vitals reviewed.  Constitutional:      General: She is not in acute distress.    Appearance: Normal appearance. She is not ill-appearing.  HENT:     Head: Normocephalic and atraumatic.  Eyes:     Pupils: Pupils are equal, round, and reactive to light.  Cardiovascular:     Rate and Rhythm: Normal rate and regular rhythm.  Pulmonary:     Effort: Pulmonary effort is normal. No respiratory distress.  Neurological:     Mental Status: She is alert and oriented to person, place, and time.  Psychiatric:        Mood and Affect: Mood normal.        Behavior: Behavior normal.  Assessment/Plan: 1. Type 2 diabetes mellitus with stage 2 chronic kidney disease, with long-term current use of insulin (HCC) Stable, A1c 5.8, continue mounjaro as prescribed and continue using omnipod as instructed.  - POCT glycosylated hemoglobin (Hb A1C) - tirzepatide (MOUNJARO) 12.5 MG/0.5ML Pen; PLEASE UPDATE FOR ANY MEDICATION CHANGE  Dispense: 6 mL; Refill: 1  2. Benign hypertension Stable, continue current medication as prescribed  3. Overweight with body mass index (BMI) of 25 to 25.9 in adult Continue diethylpropion as prescribed - Diethylpropion HCl CR 75 MG TB24; TAKE ONE TABLET EVERY DAY BEFORE BREAKFAST  Dispense: 30 tablet; Refill: 2   General Counseling: Chelsea Rivera understanding of the findings of todays visit and agrees with plan of treatment. I have discussed any further diagnostic evaluation that may be needed or ordered today. We also reviewed her medications today. she has been encouraged to call the office with any questions or concerns that should arise related to todays visit.    Orders Placed This Encounter  Procedures   POCT glycosylated hemoglobin (Hb A1C)    Meds  ordered this encounter  Medications   tirzepatide (MOUNJARO) 12.5 MG/0.5ML Pen    Sig: PLEASE UPDATE FOR ANY MEDICATION CHANGE    Dispense:  6 mL    Refill:  1    Fill today asap please   Diethylpropion HCl CR 75 MG TB24    Sig: TAKE ONE TABLET EVERY DAY BEFORE BREAKFAST    Dispense:  30 tablet    Refill:  2    FOR FUTURE REFILL PLEASE    Return in about 3 months (around 01/12/2023).   Total time spent:30 Minutes Time spent includes review of chart, medications, test results, and follow up plan with the patient.   [place] Controlled Substance Database was reviewed by me.  This patient was seen by Sallyanne Kuster, FNP-C in collaboration with Dr. Beverely Risen as a part of collaborative care agreement.   Chelsea Rivera R. Tedd Sias, MSN, FNP-C Internal medicine

## 2022-10-20 ENCOUNTER — Other Ambulatory Visit: Payer: Self-pay | Admitting: Nurse Practitioner

## 2022-10-20 DIAGNOSIS — Z760 Encounter for issue of repeat prescription: Secondary | ICD-10-CM

## 2022-10-20 DIAGNOSIS — E1165 Type 2 diabetes mellitus with hyperglycemia: Secondary | ICD-10-CM

## 2022-11-16 ENCOUNTER — Other Ambulatory Visit: Payer: Self-pay | Admitting: Nurse Practitioner

## 2022-11-16 DIAGNOSIS — F5101 Primary insomnia: Secondary | ICD-10-CM

## 2022-11-16 NOTE — Telephone Encounter (Signed)
Please send

## 2022-11-22 ENCOUNTER — Other Ambulatory Visit: Payer: Self-pay | Admitting: Nurse Practitioner

## 2023-01-04 ENCOUNTER — Other Ambulatory Visit: Payer: Self-pay

## 2023-01-04 DIAGNOSIS — E1122 Type 2 diabetes mellitus with diabetic chronic kidney disease: Secondary | ICD-10-CM

## 2023-01-04 MED ORDER — MOUNJARO 12.5 MG/0.5ML ~~LOC~~ SOAJ
SUBCUTANEOUS | 1 refills | Status: DC
Start: 2023-01-04 — End: 2023-06-28

## 2023-01-14 ENCOUNTER — Encounter: Payer: Self-pay | Admitting: Nurse Practitioner

## 2023-01-14 ENCOUNTER — Ambulatory Visit (INDEPENDENT_AMBULATORY_CARE_PROVIDER_SITE_OTHER): Payer: BC Managed Care – PPO | Admitting: Nurse Practitioner

## 2023-01-14 VITALS — BP 120/64 | HR 88 | Temp 98.1°F | Resp 16 | Ht 64.0 in | Wt 143.8 lb

## 2023-01-14 DIAGNOSIS — Z794 Long term (current) use of insulin: Secondary | ICD-10-CM

## 2023-01-14 DIAGNOSIS — E782 Mixed hyperlipidemia: Secondary | ICD-10-CM | POA: Diagnosis not present

## 2023-01-14 DIAGNOSIS — E538 Deficiency of other specified B group vitamins: Secondary | ICD-10-CM

## 2023-01-14 DIAGNOSIS — D638 Anemia in other chronic diseases classified elsewhere: Secondary | ICD-10-CM | POA: Diagnosis not present

## 2023-01-14 DIAGNOSIS — Z6825 Body mass index (BMI) 25.0-25.9, adult: Secondary | ICD-10-CM

## 2023-01-14 DIAGNOSIS — E663 Overweight: Secondary | ICD-10-CM

## 2023-01-14 DIAGNOSIS — N182 Chronic kidney disease, stage 2 (mild): Secondary | ICD-10-CM

## 2023-01-14 DIAGNOSIS — E1122 Type 2 diabetes mellitus with diabetic chronic kidney disease: Secondary | ICD-10-CM

## 2023-01-14 MED ORDER — DIETHYLPROPION HCL ER 75 MG PO TB24: ORAL_TABLET | ORAL | 2 refills | Status: DC

## 2023-01-14 NOTE — Progress Notes (Signed)
[facility] [address] [place], [place] [address]  Internal MEDICINE  Office Visit Note  Patient Name: Chelsea Rivera  [id number]  [id number]  Date of Service: 01/14/2023  Chief Complaint  Patient presents with   Diabetes   Gastroesophageal Reflux   Hypertension   Hyperlipidemia   Follow-up    HPI Lytzy presents for a follow-up visit for diabetes, anemia, and weight loss.  Diabetes -- omnipod basal rate is 0.25 units per hour from midnight to 6am, and 0.6 units per hr during the day, for total of 12.3 units total during the day. 4-6 units total additional for meals during the day.  Iron deficiency anemia -- has not had her iron levels or B12 level checked in several months.  Weight loss --- wants to lose about 5 more lbs. Diethylpropion helps to decrease her appetite, wants to continue the medication.     Current Medication: Outpatient Encounter Medications as of 01/14/2023  Medication Sig   aspirin EC 81 MG tablet Take 81 mg by mouth daily.   atorvastatin (LIPITOR) 10 MG tablet TAKE 1/2 TABLET EVERYDAY FOR HIGH CHOLESTEROL   bisoprolol (ZEBETA) 5 MG tablet TAKE ONE TABLET BY MOUTH AT BEDTIME   clotrimazole-betamethasone (LOTRISONE) cream Apply 1 application topically daily.   Continuous Blood Gluc Receiver (DEXCOM G6 RECEIVER) DEVI Use as directed for continuous glucose monitoring - E11.65   Continuous Glucose Sensor (DEXCOM G6 SENSOR) MISC TO BE USED AS DIRECTED FOR CONTINUOUS GLUCOSE MONITORING.   Continuous Glucose Transmitter (DEXCOM G6 TRANSMITTER) MISC USE AS DIRECTED FOR CONTINUOUS GLUCOSE MONITORING.   cyclobenzaprine (FLEXERIL) 10 MG tablet Take 1 tablet (10 mg total) by mouth at bedtime.   ferrous sulfate 325 (65 FE) MG EC tablet Take 1 tablet (325 mg total) by mouth 3 (three) times a week.   linaclotide (LINZESS) 145 MCG CAPS capsule Take 1 capsule (145 mcg total) by mouth daily before breakfast.   metFORMIN (GLUCOPHAGE) 1000 MG tablet Take 1 tablet  (1,000 mg total) by mouth 2 (two) times daily.   NOVOLOG 100 UNIT/ML injection USE AS DIRECTED WITH INSULIN PUMP WITH OMNIPOD AND SLIDING SCALE **MAXIMUM OF 50 UNITS PER DAY**   omeprazole (PRILOSEC OTC) 20 MG tablet Take by mouth.   omeprazole (PRILOSEC) 20 MG capsule TAKE 1 CAPSULE BY MOUTH EVERY DAY   ondansetron (ZOFRAN) 8 MG tablet TAKE ONE TABLET BY MOUTH TWICE DAILY AS NEEDED FOR NAUSEA   patiromer (VELTASSA) 8.4 g packet    tirzepatide (MOUNJARO) 12.5 MG/0.5ML Pen PLEASE UPDATE FOR ANY MEDICATION CHANGE   zolpidem (AMBIEN) 10 MG tablet TAKE 1 TABLET BY MOUTH AT BEDTIME AS NEEDED   [DISCONTINUED] Diethylpropion HCl CR 75 MG TB24 Take 1 tablet (75 mg total) by mouth daily before breakfast.   [DISCONTINUED] Diethylpropion HCl CR 75 MG TB24 TAKE ONE TABLET EVERY DAY BEFORE BREAKFAST   Diethylpropion HCl CR 75 MG TB24 TAKE ONE TABLET EVERY DAY BEFORE BREAKFAST   No facility-administered encounter medications on file as of 01/14/2023.    Surgical History: Past Surgical History:  Procedure Laterality Date   BREAST BIOPSY Right 2012   cleaned out fistula, not a biopsy   CHOLECYSTECTOMY     COLONOSCOPY WITH PROPOFOL N/A 07/07/2018   Procedure: COLONOSCOPY WITH PROPOFOL;  Surgeon: Christena Deem, MD;  Location: [facility] ENDOSCOPY;  Service: Endoscopy;  Laterality: N/A;   COLONOSCOPY WITH PROPOFOL N/A 07/27/2021   Procedure: COLONOSCOPY WITH PROPOFOL;  Surgeon: Jaynie Collins, DO;  Location: [facility] ENDOSCOPY;  Service:  Gastroenterology;  Laterality: N/A;   ESOPHAGOGASTRODUODENOSCOPY (EGD) WITH PROPOFOL N/A 07/27/2021   Procedure: ESOPHAGOGASTRODUODENOSCOPY (EGD) WITH PROPOFOL;  Surgeon: Jaynie Collins, DO;  Location: [facility] ENDOSCOPY;  Service: Gastroenterology;  Laterality: N/A;   SHOULDER ARTHROSCOPY      Medical History: Past Medical History:  Diagnosis Date   Diabetes mellitus without complication (HCC)    GERD (gastroesophageal reflux disease)    Hyperlipidemia     Hypertension    IDA (iron deficiency anemia) 01/27/2021    Family History: Family History  Problem Relation Age of Onset   Lung cancer Mother    Colon cancer Father    Breast cancer Neg Hx     Social History   Socioeconomic History   Marital status: Married    Spouse name: Not on file   Number of children: Not on file   Years of education: Not on file   Highest education level: Not on file  Occupational History   Not on file  Tobacco Use   Smoking status: Never   Smokeless tobacco: Never  Vaping Use   Vaping status: Never Used  Substance and Sexual Activity   Alcohol use: Yes    Comment: socially   Drug use: No   Sexual activity: Not on file  Other Topics Concern   Not on file  Social History Narrative   Not on file   Social Determinants of Health   Financial Resource Strain: Not on file  Food Insecurity: Not on file  Transportation Needs: Not on file  Physical Activity: Not on file  Stress: Not on file  Social Connections: Not on file  Intimate Partner Violence: Not on file      Review of Systems  Constitutional:  Negative for chills, fatigue and unexpected weight change.  HENT:  Negative for congestion, rhinorrhea, sneezing and sore throat.   Eyes:  Negative for redness.  Respiratory: Negative.  Negative for cough, chest tightness, shortness of breath and wheezing.   Cardiovascular: Negative.  Negative for chest pain and palpitations.  Gastrointestinal:  Negative for abdominal pain, constipation, diarrhea, nausea and vomiting.  Genitourinary:  Negative for dysuria and frequency.  Musculoskeletal:  Negative for arthralgias, back pain, joint swelling and neck pain.  Skin:  Negative for rash.  Neurological: Negative.  Negative for tremors and numbness.  Hematological:  Negative for adenopathy. Does not bruise/bleed easily.  Psychiatric/Behavioral:  Negative for behavioral problems (Depression), sleep disturbance and suicidal ideas. The patient is not  nervous/anxious.     Vital Signs: BP 120/64   Pulse 88   Temp 98.1 F (36.7 C)   Resp 16   Ht 5\' 4"  (1.626 m)   Wt 143 lb 12.8 oz (65.2 kg)   SpO2 98%   BMI 24.68 kg/m    Physical Exam Vitals reviewed.  Constitutional:      General: She is not in acute distress.    Appearance: Normal appearance. She is not ill-appearing.  HENT:     Head: Normocephalic and atraumatic.  Eyes:     Pupils: Pupils are equal, round, and reactive to light.  Cardiovascular:     Rate and Rhythm: Normal rate and regular rhythm.  Pulmonary:     Effort: Pulmonary effort is normal. No respiratory distress.  Neurological:     Mental Status: She is alert and oriented to person, place, and time.  Psychiatric:        Mood and Affect: Mood normal.        Behavior: Behavior normal.  Assessment/Plan: 1. Type 2 diabetes mellitus with stage 2 chronic kidney disease, with long-term current use of insulin (HCC) A1c is stable, insulin infusion rate is continuing to decrease. Glucose readings have been stable.   2. Anemia in other chronic diseases classified elsewhere Routine labs ordered  - B12 and Folate Panel - CBC with Differential/Platelet - Iron, TIBC and Ferritin Panel  3. B12 deficiency Routine labs ordered  - B12 and Folate Panel - CBC with Differential/Platelet - Iron, TIBC and Ferritin Panel  4. Mixed hyperlipidemia Routine lab ordered - Lipid Profile  5. Overweight with body mass index (BMI) of 25 to 25.9 in adult Continue diethylpropion as prescribed.  - Diethylpropion HCl CR 75 MG TB24; TAKE ONE TABLET EVERY DAY BEFORE BREAKFAST  Dispense: 30 tablet; Refill: 2   General Counseling: bennie lego understanding of the findings of todays visit and agrees with plan of treatment. I have discussed any further diagnostic evaluation that may be needed or ordered today. We also reviewed her medications today. she has been encouraged to call the office with any questions or  concerns that should arise related to todays visit.    Orders Placed This Encounter  Procedures   B12 and Folate Panel   CBC with Differential/Platelet   Iron, TIBC and Ferritin Panel   Lipid Profile    Meds ordered this encounter  Medications   Diethylpropion HCl CR 75 MG TB24    Sig: TAKE ONE TABLET EVERY DAY BEFORE BREAKFAST    Dispense:  30 tablet    Refill:  2    FOR FUTURE REFILL PLEASE    Return in about 3 months (around 04/13/2023) for CPE, Aydden Cumpian PCP and weight loss and a1c.   Total time spent:30 Minutes Time spent includes review of chart, medications, test results, and follow up plan with the patient.   [place] Controlled Substance Database was reviewed by me.  This patient was seen by Sallyanne Kuster, FNP-C in collaboration with Dr. Beverely Risen as a part of collaborative care agreement.   Tacie Mccuistion R. Tedd Sias, MSN, FNP-C Internal medicine

## 2023-01-22 ENCOUNTER — Encounter: Payer: Self-pay | Admitting: Nurse Practitioner

## 2023-02-01 LAB — IRON,TIBC AND FERRITIN PANEL
Ferritin: 80 ng/mL (ref 15–150)
Iron Saturation: 19 % (ref 15–55)
Iron: 54 ug/dL (ref 27–139)
Total Iron Binding Capacity: 291 ug/dL (ref 250–450)
UIBC: 237 ug/dL (ref 118–369)

## 2023-02-01 LAB — LIPID PANEL
Chol/HDL Ratio: 2 ratio (ref 0.0–4.4)
Cholesterol, Total: 137 mg/dL (ref 100–199)
HDL: 68 mg/dL (ref >39)
LDL Chol Calc (NIH): 55 mg/dL (ref 0–99)
Triglycerides: 67 mg/dL (ref 0–149)
VLDL Cholesterol Cal: 14 mg/dL (ref 5–40)

## 2023-02-01 LAB — CBC WITH DIFFERENTIAL/PLATELET
Basophils Absolute: 0 10*3/uL (ref 0.0–0.2)
Basos: 1 %
EOS (ABSOLUTE): 0.2 10*3/uL (ref 0.0–0.4)
Eos: 5 %
Hematocrit: 38.1 % (ref 34.0–46.6)
Hemoglobin: 12.2 g/dL (ref 11.1–15.9)
Immature Grans (Abs): 0 10*3/uL (ref 0.0–0.1)
Immature Granulocytes: 0 %
Lymphocytes Absolute: 2 10*3/uL (ref 0.7–3.1)
Lymphs: 39 %
MCH: 29.3 pg (ref 26.6–33.0)
MCHC: 32 g/dL (ref 31.5–35.7)
MCV: 91 fL (ref 79–97)
Monocytes Absolute: 0.4 10*3/uL (ref 0.1–0.9)
Monocytes: 9 %
Neutrophils Absolute: 2.3 10*3/uL (ref 1.4–7.0)
Neutrophils: 46 %
Platelets: 284 10*3/uL (ref 150–450)
RBC: 4.17 x10E6/uL (ref 3.77–5.28)
RDW: 13 % (ref 11.7–15.4)
WBC: 5.1 10*3/uL (ref 3.4–10.8)

## 2023-02-01 LAB — B12 AND FOLATE PANEL
Folate: 4.9 ng/mL (ref >3.0)
Vitamin B-12: 424 pg/mL (ref 232–1245)

## 2023-02-07 ENCOUNTER — Other Ambulatory Visit: Payer: Self-pay

## 2023-02-07 ENCOUNTER — Telehealth: Payer: Self-pay

## 2023-02-07 DIAGNOSIS — E1165 Type 2 diabetes mellitus with hyperglycemia: Secondary | ICD-10-CM

## 2023-02-07 NOTE — Telephone Encounter (Signed)
Lmom that [facility] sent pres refills for dexcom refills

## 2023-02-09 ENCOUNTER — Other Ambulatory Visit: Payer: Self-pay

## 2023-02-09 DIAGNOSIS — E1165 Type 2 diabetes mellitus with hyperglycemia: Secondary | ICD-10-CM

## 2023-02-09 MED ORDER — DEXCOM G6 SENSOR MISC: 1 refills | Status: DC

## 2023-02-20 ENCOUNTER — Other Ambulatory Visit: Payer: Self-pay | Admitting: Nurse Practitioner

## 2023-04-05 ENCOUNTER — Encounter: Payer: Self-pay | Admitting: Nurse Practitioner

## 2023-04-05 ENCOUNTER — Ambulatory Visit (INDEPENDENT_AMBULATORY_CARE_PROVIDER_SITE_OTHER): Payer: BC Managed Care – PPO | Admitting: Nurse Practitioner

## 2023-04-05 VITALS — BP 130/73 | HR 98 | Temp 97.6°F | Resp 16 | Ht 64.0 in | Wt 146.0 lb

## 2023-04-05 DIAGNOSIS — E1159 Type 2 diabetes mellitus with other circulatory complications: Secondary | ICD-10-CM

## 2023-04-05 DIAGNOSIS — E1122 Type 2 diabetes mellitus with diabetic chronic kidney disease: Secondary | ICD-10-CM

## 2023-04-05 DIAGNOSIS — N182 Chronic kidney disease, stage 2 (mild): Secondary | ICD-10-CM

## 2023-04-05 DIAGNOSIS — I152 Hypertension secondary to endocrine disorders: Secondary | ICD-10-CM

## 2023-04-05 DIAGNOSIS — Z794 Long term (current) use of insulin: Secondary | ICD-10-CM

## 2023-04-05 DIAGNOSIS — E1169 Type 2 diabetes mellitus with other specified complication: Secondary | ICD-10-CM | POA: Diagnosis not present

## 2023-04-05 DIAGNOSIS — Z0001 Encounter for general adult medical examination with abnormal findings: Secondary | ICD-10-CM | POA: Diagnosis not present

## 2023-04-05 DIAGNOSIS — E785 Hyperlipidemia, unspecified: Secondary | ICD-10-CM

## 2023-04-05 LAB — ESTIMATED GFR: EGFR: 62

## 2023-04-05 NOTE — Progress Notes (Signed)
[facility] [address] [place], [place] [address]  Internal MEDICINE  Office Visit Note  Patient Name: Chelsea Rivera  [id number]  [id number]  Date of Service: 04/05/2023  Chief Complaint  Patient presents with   Annual Exam   Diabetes   Gastroesophageal Reflux   Hypertension   Hyperlipidemia    HPI Chelsea Rivera presents for an annual well visit and physical exam.  Well-appearing 62 y.o. female with type 2 diabetes and hypertension and history of anemia.   Routine CRC screening: due in 2033 Routine mammogram: due in January  DEXA scan: due next year  Pap smear: due in 2026 but patient will have aged out, will only do as needed from then.  Eye exam: last visit was 03/28/23, getting cataract surgery next month  foot exam:done today  Labs: labs are up to date  New or worsening pain: none  Other concerns: none  Total daily insulin dose: 18 units daily approx. Still currently using omnipod portable insulin pump.  Hold diethylpropion until January    Current Medication: Outpatient Encounter Medications as of 04/05/2023  Medication Sig   aspirin EC 81 MG tablet Take 81 mg by mouth daily.   atorvastatin (LIPITOR) 10 MG tablet TAKE 1/2 TABLET EVERYDAY FOR HIGH CHOLESTEROL   bisoprolol (ZEBETA) 5 MG tablet TAKE ONE TABLET BY MOUTH AT BEDTIME   clotrimazole-betamethasone (LOTRISONE) cream Apply 1 application topically daily.   Continuous Blood Gluc Receiver (DEXCOM G6 RECEIVER) DEVI Use as directed for continuous glucose monitoring - E11.65   Continuous Glucose Sensor (DEXCOM G6 SENSOR) MISC TO BE USED AS DIRECTED FOR CONTINUOUS GLUCOSE MONITORING.   Continuous Glucose Transmitter (DEXCOM G6 TRANSMITTER) MISC USE AS DIRECTED FOR CONTINUOUS GLUCOSE MONITORING.   cyclobenzaprine (FLEXERIL) 10 MG tablet Take 1 tablet (10 mg total) by mouth at bedtime.   Diethylpropion HCl CR 75 MG TB24 TAKE ONE TABLET EVERY DAY BEFORE BREAKFAST   ferrous sulfate 325 (65 FE) MG EC tablet Take  1 tablet (325 mg total) by mouth 3 (three) times a week.   linaclotide (LINZESS) 145 MCG CAPS capsule Take 1 capsule (145 mcg total) by mouth daily before breakfast.   metFORMIN (GLUCOPHAGE) 1000 MG tablet Take 1 tablet (1,000 mg total) by mouth 2 (two) times daily.   NOVOLOG 100 UNIT/ML injection USE AS DIRECTED WITH INSULIN PUMP WITH OMNIPOD AND SLIDING SCALE **MAXIMUM OF 50 UNITS PER DAY**   omeprazole (PRILOSEC OTC) 20 MG tablet Take by mouth.   omeprazole (PRILOSEC) 20 MG capsule TAKE 1 CAPSULE BY MOUTH EVERY DAY   ondansetron (ZOFRAN) 8 MG tablet TAKE ONE TABLET BY MOUTH TWICE DAILY AS NEEDED FOR NAUSEA   patiromer (VELTASSA) 8.4 g packet    tirzepatide (MOUNJARO) 12.5 MG/0.5ML Pen PLEASE UPDATE FOR ANY MEDICATION CHANGE   zolpidem (AMBIEN) 10 MG tablet TAKE 1 TABLET BY MOUTH AT BEDTIME AS NEEDED   No facility-administered encounter medications on file as of 04/05/2023.    Surgical History: Past Surgical History:  Procedure Laterality Date   BREAST BIOPSY Right 2012   cleaned out fistula, not a biopsy   CHOLECYSTECTOMY     COLONOSCOPY WITH PROPOFOL N/A 07/07/2018   Procedure: COLONOSCOPY WITH PROPOFOL;  Surgeon: Christena Deem, MD;  Location: [facility] ENDOSCOPY;  Service: Endoscopy;  Laterality: N/A;   COLONOSCOPY WITH PROPOFOL N/A 07/27/2021   Procedure: COLONOSCOPY WITH PROPOFOL;  Surgeon: Jaynie Collins, DO;  Location: [facility] ENDOSCOPY;  Service: Gastroenterology;  Laterality: N/A;   ESOPHAGOGASTRODUODENOSCOPY (EGD) WITH PROPOFOL N/A 07/27/2021  Procedure: ESOPHAGOGASTRODUODENOSCOPY (EGD) WITH PROPOFOL;  Surgeon: Jaynie Collins, DO;  Location: [facility] ENDOSCOPY;  Service: Gastroenterology;  Laterality: N/A;   SHOULDER ARTHROSCOPY      Medical History: Past Medical History:  Diagnosis Date   Diabetes mellitus without complication (HCC)    GERD (gastroesophageal reflux disease)    Hyperlipidemia    Hypertension    IDA (iron deficiency anemia) 01/27/2021    Family  History: Family History  Problem Relation Age of Onset   Lung cancer Mother    Colon cancer Father    Breast cancer Neg Hx     Social History   Socioeconomic History   Marital status: Married    Spouse name: Not on file   Number of children: Not on file   Years of education: Not on file   Highest education level: Not on file  Occupational History   Not on file  Tobacco Use   Smoking status: Never   Smokeless tobacco: Never  Vaping Use   Vaping status: Never Used  Substance and Sexual Activity   Alcohol use: Yes    Comment: socially   Drug use: No   Sexual activity: Not on file  Other Topics Concern   Not on file  Social History Narrative   Not on file   Social Determinants of Health   Financial Resource Strain: Not on file  Food Insecurity: Not on file  Transportation Needs: Not on file  Physical Activity: Not on file  Stress: Not on file  Social Connections: Not on file  Intimate Partner Violence: Not on file      Review of Systems  Constitutional:  Negative for activity change, appetite change, chills, fatigue, fever and unexpected weight change.  HENT: Negative.  Negative for congestion, ear pain, rhinorrhea, sore throat and trouble swallowing.   Eyes: Negative.   Respiratory: Negative.  Negative for cough, chest tightness, shortness of breath and wheezing.   Cardiovascular: Negative.  Negative for chest pain.  Gastrointestinal: Negative.  Negative for abdominal pain, blood in stool, constipation, diarrhea, nausea and vomiting.  Endocrine: Negative.   Genitourinary: Negative.  Negative for difficulty urinating, dysuria, frequency, hematuria and urgency.  Musculoskeletal: Negative.  Negative for arthralgias, back pain, joint swelling, myalgias and neck pain.  Skin: Negative.  Negative for rash and wound.  Allergic/Immunologic: Negative.  Negative for immunocompromised state.  Neurological: Negative.  Negative for dizziness, seizures, numbness and headaches.   Hematological: Negative.   Psychiatric/Behavioral: Negative.  Negative for behavioral problems, self-injury and suicidal ideas. The patient is not nervous/anxious.     Vital Signs: BP 130/73   Pulse 98   Temp 97.6 F (36.4 C)   Resp 16   Ht 5\' 4"  (1.626 m)   Wt 146 lb (66.2 kg)   SpO2 98%   BMI 25.06 kg/m    Physical Exam Vitals reviewed.  Constitutional:      General: She is awake. She is not in acute distress.    Appearance: Normal appearance. She is well-developed and well-groomed. She is obese. She is not ill-appearing or diaphoretic.  HENT:     Head: Normocephalic and atraumatic.     Right Ear: Tympanic membrane, ear canal and external ear normal.     Left Ear: Tympanic membrane, ear canal and external ear normal.     Nose: Nose normal.     Mouth/Throat:     Lips: Pink.     Mouth: Mucous membranes are moist.     Pharynx: Oropharynx is clear.  Uvula midline. No oropharyngeal exudate or posterior oropharyngeal erythema.  Eyes:     General: Lids are normal. Vision grossly intact. Gaze aligned appropriately. No scleral icterus.       Right eye: No discharge.        Left eye: No discharge.     Conjunctiva/sclera: Conjunctivae normal.     Pupils: Pupils are equal, round, and reactive to light.     Funduscopic exam:    Right eye: Red reflex present.        Left eye: Red reflex present. Neck:     Thyroid: No thyromegaly.     Vascular: No carotid bruit or JVD.     Trachea: Trachea and phonation normal. No tracheal deviation.  Cardiovascular:     Rate and Rhythm: Normal rate and regular rhythm.     Pulses: Normal pulses.     Heart sounds: Normal heart sounds, S1 normal and S2 normal. No murmur heard.    No friction rub. No gallop.  Pulmonary:     Effort: Pulmonary effort is normal. No accessory muscle usage or respiratory distress.     Breath sounds: Normal breath sounds and air entry. No stridor. No wheezing, rhonchi or rales.  Chest:     Chest wall: No tenderness.      Comments: Declined breast exam, will get mammogram Abdominal:     General: Bowel sounds are normal. There is no distension.     Palpations: Abdomen is soft. There is no shifting dullness, fluid wave, mass or pulsatile mass.     Tenderness: There is no abdominal tenderness. There is no guarding or rebound.  Musculoskeletal:        General: No tenderness or deformity. Normal range of motion.     Cervical back: Normal range of motion and neck supple.     Right lower leg: No edema.     Left lower leg: No edema.  Lymphadenopathy:     Cervical: No cervical adenopathy.  Skin:    General: Skin is warm and dry.     Capillary Refill: Capillary refill takes less than 2 seconds.     Coloration: Skin is not pale.     Findings: No erythema or rash.  Neurological:     Mental Status: She is alert and oriented to person, place, and time.     Cranial Nerves: No cranial nerve deficit.     Motor: No abnormal muscle tone.     Coordination: Coordination normal.     Gait: Gait normal.     Deep Tendon Reflexes: Reflexes are normal and symmetric.  Psychiatric:        Mood and Affect: Mood and affect normal.        Behavior: Behavior normal. Behavior is cooperative.        Thought Content: Thought content normal.        Judgment: Judgment normal.        Assessment/Plan: 1. Encounter for routine adult health examination with abnormal findings Age-appropriate preventive screenings and vaccinations discussed, annual physical exam completed. Routine labs for health maintenance are up to date. PHM updated.   2. Type 2 diabetes mellitus with stage 2 chronic kidney disease, with long-term current use of insulin (HCC) Stable, continue omnipod and dexcom use as ordered and instructed. Continue mounjaro as prescribed. Repeat A1c in January 2025. Urine sent for microalbumin.  - Urine Microalbumin w/creat. ratio  3. Hypertension associated with type 2 diabetes mellitus (HCC) Stable, continue bisoprolol as  prescribed.  4. Hyperlipidemia associated with type 2 diabetes mellitus (HCC) Cholesterol is normal. Continue atorvastatin as prescribed.      General Counseling: Chelsea Rivera understanding of the findings of todays visit and agrees with plan of treatment. I have discussed any further diagnostic evaluation that may be needed or ordered today. We also reviewed her medications today. she has been encouraged to call the office with any questions or concerns that should arise related to todays visit.    Orders Placed This Encounter  Procedures   Estimated GFR   Urine Microalbumin w/creat. ratio    No orders of the defined types were placed in this encounter.   Return in about 2 months (around 06/05/2023).   Total time spent:30 Minutes Time spent includes review of chart, medications, test results, and follow up plan with the patient.    Controlled Substance Database was reviewed by me.  This patient was seen by Sallyanne Kuster, FNP-C in collaboration with Dr. Beverely Risen as a part of collaborative care agreement.  Kate Larock R. Tedd Sias, MSN, FNP-C Internal medicine

## 2023-04-07 LAB — MICROALBUMIN / CREATININE URINE RATIO
Creatinine, Urine: 109.7 mg/dL
Microalb/Creat Ratio: 22 mg/g{creat} (ref 0–29)
Microalbumin, Urine: 23.8 ug/mL

## 2023-04-25 ENCOUNTER — Other Ambulatory Visit: Payer: Self-pay | Admitting: Nurse Practitioner

## 2023-04-25 ENCOUNTER — Other Ambulatory Visit: Payer: Self-pay | Admitting: Internal Medicine

## 2023-04-25 DIAGNOSIS — F5101 Primary insomnia: Secondary | ICD-10-CM

## 2023-04-25 DIAGNOSIS — Z6825 Body mass index (BMI) 25.0-25.9, adult: Secondary | ICD-10-CM

## 2023-04-25 NOTE — Telephone Encounter (Signed)
Last 11/24 and 1/25

## 2023-05-09 ENCOUNTER — Encounter: Payer: Self-pay | Admitting: Oncology

## 2023-05-14 ENCOUNTER — Other Ambulatory Visit: Payer: Self-pay | Admitting: Nurse Practitioner

## 2023-05-27 ENCOUNTER — Encounter: Payer: Self-pay | Admitting: Oncology

## 2023-05-30 ENCOUNTER — Encounter: Payer: Self-pay | Admitting: Nurse Practitioner

## 2023-05-30 DIAGNOSIS — E663 Overweight: Secondary | ICD-10-CM

## 2023-05-30 DIAGNOSIS — F5101 Primary insomnia: Secondary | ICD-10-CM

## 2023-06-01 MED ORDER — DIETHYLPROPION HCL ER 75 MG PO TB24: ORAL_TABLET | ORAL | 0 refills | Status: DC

## 2023-06-01 MED ORDER — ZOLPIDEM TARTRATE 10 MG PO TABS
10.0000 mg | ORAL_TABLET | Freq: Every evening | ORAL | 0 refills | Status: DC | PRN
Start: 2023-06-01 — End: 2023-06-13

## 2023-06-03 ENCOUNTER — Ambulatory Visit: Payer: Managed Care, Other (non HMO) | Admitting: Nurse Practitioner

## 2023-06-06 ENCOUNTER — Ambulatory Visit: Payer: Managed Care, Other (non HMO) | Admitting: Nurse Practitioner

## 2023-06-13 ENCOUNTER — Encounter: Payer: Self-pay | Admitting: Nurse Practitioner

## 2023-06-13 ENCOUNTER — Ambulatory Visit (INDEPENDENT_AMBULATORY_CARE_PROVIDER_SITE_OTHER): Payer: 59 | Admitting: Nurse Practitioner

## 2023-06-13 ENCOUNTER — Other Ambulatory Visit: Payer: Self-pay | Admitting: Nurse Practitioner

## 2023-06-13 VITALS — BP 135/70 | HR 83 | Temp 97.6°F | Resp 16 | Ht 64.0 in | Wt 146.2 lb

## 2023-06-13 DIAGNOSIS — E1159 Type 2 diabetes mellitus with other circulatory complications: Secondary | ICD-10-CM

## 2023-06-13 DIAGNOSIS — N182 Chronic kidney disease, stage 2 (mild): Secondary | ICD-10-CM

## 2023-06-13 DIAGNOSIS — E1169 Type 2 diabetes mellitus with other specified complication: Secondary | ICD-10-CM

## 2023-06-13 DIAGNOSIS — M7712 Lateral epicondylitis, left elbow: Secondary | ICD-10-CM

## 2023-06-13 DIAGNOSIS — Z6825 Body mass index (BMI) 25.0-25.9, adult: Secondary | ICD-10-CM

## 2023-06-13 DIAGNOSIS — Z794 Long term (current) use of insulin: Secondary | ICD-10-CM

## 2023-06-13 DIAGNOSIS — F5101 Primary insomnia: Secondary | ICD-10-CM

## 2023-06-13 DIAGNOSIS — E1122 Type 2 diabetes mellitus with diabetic chronic kidney disease: Secondary | ICD-10-CM

## 2023-06-13 DIAGNOSIS — K120 Recurrent oral aphthae: Secondary | ICD-10-CM

## 2023-06-13 DIAGNOSIS — E663 Overweight: Secondary | ICD-10-CM

## 2023-06-13 DIAGNOSIS — I152 Hypertension secondary to endocrine disorders: Secondary | ICD-10-CM

## 2023-06-13 DIAGNOSIS — Z79899 Other long term (current) drug therapy: Secondary | ICD-10-CM

## 2023-06-13 DIAGNOSIS — E785 Hyperlipidemia, unspecified: Secondary | ICD-10-CM

## 2023-06-13 LAB — POCT GLYCOSYLATED HEMOGLOBIN (HGB A1C): Hemoglobin A1C: 6.1 % — AB (ref 4.0–5.6)

## 2023-06-13 MED ORDER — OMEPRAZOLE 20 MG PO CPDR: 20.0000 mg | DELAYED_RELEASE_CAPSULE | Freq: Every day | ORAL | 1 refills | Status: DC

## 2023-06-13 MED ORDER — ZOLPIDEM TARTRATE 10 MG PO TABS: 10.0000 mg | ORAL_TABLET | Freq: Every evening | ORAL | 2 refills | Status: DC | PRN

## 2023-06-13 MED ORDER — METFORMIN HCL 1000 MG PO TABS: 1000.0000 mg | ORAL_TABLET | Freq: Two times a day (BID) | ORAL | 3 refills | Status: DC

## 2023-06-13 MED ORDER — DIETHYLPROPION HCL ER 75 MG PO TB24: ORAL_TABLET | ORAL | 2 refills | Status: DC

## 2023-06-13 MED ORDER — DEXAMETHASONE 0.5 MG/5ML PO SOLN: 0.5000 mg | Freq: Every day | ORAL | 0 refills | Status: DC

## 2023-06-13 MED ORDER — ONDANSETRON HCL 8 MG PO TABS: ORAL_TABLET | ORAL | 2 refills | Status: AC

## 2023-06-13 MED ORDER — PREDNISONE 10 MG (21) PO TBPK: ORAL_TABLET | ORAL | 0 refills | Status: DC

## 2023-06-13 MED ORDER — BISOPROLOL FUMARATE 5 MG PO TABS: 5.0000 mg | ORAL_TABLET | Freq: Every day | ORAL | 3 refills | Status: DC

## 2023-06-13 MED ORDER — ATORVASTATIN CALCIUM 10 MG PO TABS: ORAL_TABLET | ORAL | 3 refills | Status: DC

## 2023-06-13 MED ORDER — INSULIN ASPART 100 UNIT/ML IJ SOLN: INTRAMUSCULAR | 3 refills | Status: DC

## 2023-06-13 NOTE — Progress Notes (Signed)
[facility] [address] [place], [place] [address]  Internal MEDICINE  Office Visit Note  Patient Name: Chelsea Rivera  [id number]  [id number]  Date of Service: 06/13/2023  Chief Complaint  Patient presents with   Diabetes   Gastroesophageal Reflux   Hypertension   Hyperlipidemia   Follow-up    HPI Chelsea Rivera presents for a follow-up visit for diabetes, tennis elbow, insomnia, and refills.  1. Diabetes -- stable, no significant change. Glucose levels have been controlled on omnipod.  A1c is 6.1.  2. Tennis elbow -- heat and ice now, and OTC ibuprofen. Also wearing a brace for the elbow. Has had this before and a prednisone taper helped last time.  3. Insomnia -- taking ambien, no issues, remains effective, due for refills. 4. Weight loss -- takes diethylpropion daily for appetite suppressant, no issues, doing well.  5. Insurance switched pharmacy coverage. Needs to switch her medications to [facility] in [place].      Current Medication: Outpatient Encounter Medications as of 06/13/2023  Medication Sig   aspirin EC 81 MG tablet Take 81 mg by mouth daily.   clotrimazole-betamethasone (LOTRISONE) cream Apply 1 application topically daily.   Continuous Blood Gluc Receiver (DEXCOM G6 RECEIVER) DEVI Use as directed for continuous glucose monitoring - E11.65   Continuous Glucose Sensor (DEXCOM G6 SENSOR) MISC TO BE USED AS DIRECTED FOR CONTINUOUS GLUCOSE MONITORING.   Continuous Glucose Transmitter (DEXCOM G6 TRANSMITTER) MISC USE AS DIRECTED FOR CONTINUOUS GLUCOSE MONITORING.   dexamethasone (DECADRON) 0.5 MG/5ML solution Take 5 mLs (0.5 mg total) by mouth daily. SWISH AND SPIT   ferrous sulfate 325 (65 FE) MG EC tablet Take 1 tablet (325 mg total) by mouth 3 (three) times a week.   Insulin Disposable Pump (OMNIPOD DASH PODS, GEN 4,) MISC Inject into the skin.   patiromer (VELTASSA) 8.4 g packet    predniSONE (STERAPRED UNI-PAK 21 TAB) 10 MG (21) TBPK tablet Use as directed for 6  days   tirzepatide (MOUNJARO) 12.5 MG/0.5ML Pen PLEASE UPDATE FOR ANY MEDICATION CHANGE   [DISCONTINUED] atorvastatin (LIPITOR) 10 MG tablet TAKE 1/2 TABLET EVERYDAY FOR HIGH CHOLESTEROL   [DISCONTINUED] bisoprolol (ZEBETA) 5 MG tablet TAKE ONE TABLET BY MOUTH AT BEDTIME   [DISCONTINUED] cyclobenzaprine (FLEXERIL) 10 MG tablet Take 1 tablet (10 mg total) by mouth at bedtime.   [DISCONTINUED] Diethylpropion HCl CR 75 MG TB24 TAKE ONE TABLET EVERY DAY BEFORE BREAKFAST   [DISCONTINUED] linaclotide (LINZESS) 145 MCG CAPS capsule Take 1 capsule (145 mcg total) by mouth daily before breakfast.   [DISCONTINUED] metFORMIN (GLUCOPHAGE) 1000 MG tablet Take 1 tablet (1,000 mg total) by mouth 2 (two) times daily.   [DISCONTINUED] NOVOLOG 100 UNIT/ML injection USE AS DIRECTED WITH INSULIN PUMP WITH OMNIPOD AND SLIDING SCALE **MAXIMUM OF 50 UNITS PER DAY**   [DISCONTINUED] omeprazole (PRILOSEC) 20 MG capsule TAKE 1 CAPSULE BY MOUTH EVERY DAY   [DISCONTINUED] ondansetron (ZOFRAN) 8 MG tablet TAKE ONE TABLET BY MOUTH TWICE DAILY AS NEEDED FOR NAUSEA   [DISCONTINUED] zolpidem (AMBIEN) 10 MG tablet Take 1 tablet (10 mg total) by mouth at bedtime as needed.   atorvastatin (LIPITOR) 10 MG tablet TAKE 1/2 TABLET EVERYDAY FOR HIGH CHOLESTEROL   bisoprolol (ZEBETA) 5 MG tablet Take 1 tablet (5 mg total) by mouth at bedtime.   Diethylpropion HCl CR 75 MG TB24 TAKE ONE TABLET EVERY DAY BEFORE BREAKFAST   insulin aspart (NOVOLOG) 100 UNIT/ML injection USE AS DIRECTED WITH INSULIN PUMP WITH OMNIPOD AND SLIDING SCALE **MAXIMUM  OF 100 UNITS PER 72 hours**   metFORMIN (GLUCOPHAGE) 1000 MG tablet Take 1 tablet (1,000 mg total) by mouth 2 (two) times daily.   omeprazole (PRILOSEC) 20 MG capsule Take 1 capsule (20 mg total) by mouth daily.   ondansetron (ZOFRAN) 8 MG tablet TAKE ONE TABLET BY MOUTH TWICE DAILY AS NEEDED FOR NAUSEA   zolpidem (AMBIEN) 10 MG tablet Take 1 tablet (10 mg total) by mouth at bedtime as needed for  sleep.   No facility-administered encounter medications on file as of 06/13/2023.    Surgical History: Past Surgical History:  Procedure Laterality Date   BREAST BIOPSY Right 2012   cleaned out fistula, not a biopsy   CHOLECYSTECTOMY     COLONOSCOPY WITH PROPOFOL N/A 07/07/2018   Procedure: COLONOSCOPY WITH PROPOFOL;  Surgeon: Christena Deem, MD;  Location: [facility] ENDOSCOPY;  Service: Endoscopy;  Laterality: N/A;   COLONOSCOPY WITH PROPOFOL N/A 07/27/2021   Procedure: COLONOSCOPY WITH PROPOFOL;  Surgeon: Jaynie Collins, DO;  Location: [facility] ENDOSCOPY;  Service: Gastroenterology;  Laterality: N/A;   ESOPHAGOGASTRODUODENOSCOPY (EGD) WITH PROPOFOL N/A 07/27/2021   Procedure: ESOPHAGOGASTRODUODENOSCOPY (EGD) WITH PROPOFOL;  Surgeon: Jaynie Collins, DO;  Location: [facility] ENDOSCOPY;  Service: Gastroenterology;  Laterality: N/A;   SHOULDER ARTHROSCOPY      Medical History: Past Medical History:  Diagnosis Date   Diabetes mellitus without complication (HCC)    GERD (gastroesophageal reflux disease)    Hyperlipidemia    Hypertension    IDA (iron deficiency anemia) 01/27/2021    Family History: Family History  Problem Relation Age of Onset   Lung cancer Mother    Colon cancer Father    Breast cancer Neg Hx     Social History   Socioeconomic History   Marital status: Married    Spouse name: Not on file   Number of children: Not on file   Years of education: Not on file   Highest education level: Not on file  Occupational History   Not on file  Tobacco Use   Smoking status: Never   Smokeless tobacco: Never  Vaping Use   Vaping status: Never Used  Substance and Sexual Activity   Alcohol use: Yes    Comment: socially   Drug use: No   Sexual activity: Not on file  Other Topics Concern   Not on file  Social History Narrative   Not on file   Social Drivers of Health   Financial Resource Strain: Not on file  Food Insecurity: Not on file  Transportation Needs:  Not on file  Physical Activity: Not on file  Stress: Not on file  Social Connections: Not on file  Intimate Partner Violence: Not on file      Review of Systems  Constitutional:  Negative for chills, fatigue and unexpected weight change.  HENT:  Negative for congestion, rhinorrhea, sneezing and sore throat.   Eyes:  Negative for redness.  Respiratory: Negative.  Negative for cough, chest tightness, shortness of breath and wheezing.   Cardiovascular: Negative.  Negative for chest pain and palpitations.  Gastrointestinal:  Negative for abdominal pain, constipation, diarrhea, nausea and vomiting.  Genitourinary:  Negative for dysuria and frequency.  Musculoskeletal:  Negative for arthralgias, back pain, joint swelling and neck pain.  Skin:  Negative for rash.  Neurological: Negative.  Negative for tremors and numbness.  Hematological:  Negative for adenopathy. Does not bruise/bleed easily.  Psychiatric/Behavioral:  Negative for behavioral problems (Depression), sleep disturbance and suicidal ideas. The patient is not  nervous/anxious.     Vital Signs: BP 135/70   Pulse 83   Temp 97.6 F (36.4 C)   Resp 16   Ht 5\' 4"  (1.626 m)   Wt 146 lb 3.2 oz (66.3 kg)   SpO2 96%   BMI 25.10 kg/m    Physical Exam Vitals reviewed.  Constitutional:      General: She is not in acute distress.    Appearance: Normal appearance. She is not ill-appearing.  HENT:     Head: Normocephalic and atraumatic.  Eyes:     Pupils: Pupils are equal, round, and reactive to light.  Cardiovascular:     Rate and Rhythm: Normal rate and regular rhythm.  Pulmonary:     Effort: Pulmonary effort is normal. No respiratory distress.  Neurological:     Mental Status: She is alert and oriented to person, place, and time.  Psychiatric:        Mood and Affect: Mood normal.        Behavior: Behavior normal.        Assessment/Plan: 1. Type 2 diabetes mellitus with stage 2 chronic kidney disease, with  long-term current use of insulin (HCC) (Primary) A1c remains stable at 6.1. continue omnipod use and metformin as prescribed.  - POCT glycosylated hemoglobin (Hb A1C) - Insulin Disposable Pump (OMNIPOD DASH PODS, GEN 4,) MISC; Inject into the skin. - insulin aspart (NOVOLOG) 100 UNIT/ML injection; USE AS DIRECTED WITH INSULIN PUMP WITH OMNIPOD AND SLIDING SCALE **MAXIMUM OF 100 UNITS PER 72 hours**  Dispense: 30 mL; Refill: 3 - metFORMIN (GLUCOPHAGE) 1000 MG tablet; Take 1 tablet (1,000 mg total) by mouth 2 (two) times daily.  Dispense: 180 tablet; Refill: 3  2. Hypertension associated with type 2 diabetes mellitus (HCC) Stable, continue bisoprolol as prescribed.  - bisoprolol (ZEBETA) 5 MG tablet; Take 1 tablet (5 mg total) by mouth at bedtime.  Dispense: 90 tablet; Refill: 3  3. Hyperlipidemia associated with type 2 diabetes mellitus (HCC) Continue atorvastatin as prescribed.  - atorvastatin (LIPITOR) 10 MG tablet; TAKE 1/2 TABLET EVERYDAY FOR HIGH CHOLESTEROL  Dispense: 45 tablet; Refill: 3  4. Aphthous ulcer of tongue Patient instructed to use liquid benadryl to swish and spit for symptoms relief. And swish and spit oral dexamethasone 3-4 times daily. For 5 days.  - dexamethasone (DECADRON) 0.5 MG/5ML solution; Take 5 mLs (0.5 mg total) by mouth daily. SWISH AND SPIT  Dispense: 100 mL; Refill: 0  5. Lateral epicondylitis of left elbow 6 day prednisone taper prescribed. Patient aware her glucose readings may be temporarily elevated.  - predniSONE (STERAPRED UNI-PAK 21 TAB) 10 MG (21) TBPK tablet; Use as directed for 6 days  Dispense: 21 tablet; Refill: 0  6. Overweight with body mass index (BMI) of 25 to 25.9 in adult Continue diethylpropion as prescribed. Follow up in 3 months for additional refills.  - Diethylpropion HCl CR 75 MG TB24; TAKE ONE TABLET EVERY DAY BEFORE BREAKFAST  Dispense: 30 tablet; Refill: 2  7. Encounter for medication review Medication list reviewed, updated and  refills ordered.  - omeprazole (PRILOSEC) 20 MG capsule; Take 1 capsule (20 mg total) by mouth daily.  Dispense: 90 capsule; Refill: 1 - ondansetron (ZOFRAN) 8 MG tablet; TAKE ONE TABLET BY MOUTH TWICE DAILY AS NEEDED FOR NAUSEA  Dispense: 60 tablet; Refill: 2  8. Primary insomnia Continue ambien as prescribed. Follow up in 3 months for additional refills.  - zolpidem (AMBIEN) 10 MG tablet; Take 1 tablet (10 mg total) by  mouth at bedtime as needed for sleep.  Dispense: 30 tablet; Refill: 2   General Counseling: zyah gomm understanding of the findings of todays visit and agrees with plan of treatment. I have discussed any further diagnostic evaluation that may be needed or ordered today. We also reviewed her medications today. she has been encouraged to call the office with any questions or concerns that should arise related to todays visit.    Orders Placed This Encounter  Procedures   POCT glycosylated hemoglobin (Hb A1C)    Meds ordered this encounter  Medications   predniSONE (STERAPRED UNI-PAK 21 TAB) 10 MG (21) TBPK tablet    Sig: Use as directed for 6 days    Dispense:  21 tablet    Refill:  0   dexamethasone (DECADRON) 0.5 MG/5ML solution    Sig: Take 5 mLs (0.5 mg total) by mouth daily. SWISH AND SPIT    Dispense:  100 mL    Refill:  0   zolpidem (AMBIEN) 10 MG tablet    Sig: Take 1 tablet (10 mg total) by mouth at bedtime as needed for sleep.    Dispense:  30 tablet    Refill:  2    For future refills   Diethylpropion HCl CR 75 MG TB24    Sig: TAKE ONE TABLET EVERY DAY BEFORE BREAKFAST    Dispense:  30 tablet    Refill:  2    FOR FUTURE REFILL PLEASE   omeprazole (PRILOSEC) 20 MG capsule    Sig: Take 1 capsule (20 mg total) by mouth daily.    Dispense:  90 capsule    Refill:  1    For next fill   insulin aspart (NOVOLOG) 100 UNIT/ML injection    Sig: USE AS DIRECTED WITH INSULIN PUMP WITH OMNIPOD AND SLIDING SCALE **MAXIMUM OF 100 UNITS PER 72 hours**     Dispense:  30 mL    Refill:  3    Dxcode E11.65 FOR NEXT FILL   metFORMIN (GLUCOPHAGE) 1000 MG tablet    Sig: Take 1 tablet (1,000 mg total) by mouth 2 (two) times daily.    Dispense:  180 tablet    Refill:  3    For future refills   atorvastatin (LIPITOR) 10 MG tablet    Sig: TAKE 1/2 TABLET EVERYDAY FOR HIGH CHOLESTEROL    Dispense:  45 tablet    Refill:  3    For next fill   bisoprolol (ZEBETA) 5 MG tablet    Sig: Take 1 tablet (5 mg total) by mouth at bedtime.    Dispense:  90 tablet    Refill:  3    For next fill   ondansetron (ZOFRAN) 8 MG tablet    Sig: TAKE ONE TABLET BY MOUTH TWICE DAILY AS NEEDED FOR NAUSEA    Dispense:  60 tablet    Refill:  2    For next fill    Return in about 3 months (around 08/31/2023) for F/U, Chelsea Rivera PCP weight loss, and refills, ambien and diethylpropion.   Total time spent:30 Minutes Time spent includes review of chart, medications, test results, and follow up plan with the patient.   [place] Controlled Substance Database was reviewed by me.  This patient was seen by Sallyanne Kuster, FNP-C in collaboration with Dr. Beverely Risen as a part of collaborative care agreement.   Libni Fusaro R. Tedd Sias, MSN, FNP-C Internal medicine

## 2023-06-14 NOTE — Telephone Encounter (Signed)
 Please review

## 2023-06-18 MED ORDER — FIASP 100 UNIT/ML IJ SOLN: INTRAMUSCULAR | 3 refills | Status: DC

## 2023-06-22 ENCOUNTER — Telehealth: Payer: Self-pay

## 2023-06-22 NOTE — Telephone Encounter (Signed)
Left message or patient to let her know that we got her [facility] approved.

## 2023-06-27 ENCOUNTER — Encounter: Payer: Self-pay | Admitting: Nurse Practitioner

## 2023-06-27 DIAGNOSIS — Z794 Long term (current) use of insulin: Secondary | ICD-10-CM

## 2023-06-28 ENCOUNTER — Other Ambulatory Visit: Payer: Self-pay | Admitting: Internal Medicine

## 2023-06-28 ENCOUNTER — Other Ambulatory Visit: Payer: Self-pay | Admitting: Nurse Practitioner

## 2023-06-28 DIAGNOSIS — Z794 Long term (current) use of insulin: Secondary | ICD-10-CM

## 2023-06-28 MED ORDER — MOUNJARO 12.5 MG/0.5ML ~~LOC~~ SOAJ: SUBCUTANEOUS | 1 refills | Status: DC

## 2023-06-29 NOTE — Telephone Encounter (Signed)
Please take care!

## 2023-07-08 ENCOUNTER — Encounter: Payer: Self-pay | Admitting: *Deleted

## 2023-07-08 ENCOUNTER — Ambulatory Visit
Admission: EM | Admit: 2023-07-08 | Discharge: 2023-07-08 | Disposition: A | Discharge status: Home / Self Care | Payer: 59 | Attending: Emergency Medicine | Admitting: Emergency Medicine

## 2023-07-08 ENCOUNTER — Encounter: Payer: Self-pay | Admitting: Oncology

## 2023-07-08 DIAGNOSIS — R55 Syncope and collapse: Secondary | ICD-10-CM

## 2023-07-08 DIAGNOSIS — S0181XA Laceration without foreign body of other part of head, initial encounter: Secondary | ICD-10-CM

## 2023-07-08 NOTE — Discharge Instructions (Addendum)
 Today you are evaluated for the cut to your chin  EKG shows that heart is beating regular   Has been reattached with tissue adhesive, do not peel off, this will fall off with time  Cleanse daily with soap and water, pat and do not rub, may apply topical antibiotic ointment per preference  May take Tylenol and or Motrin as needed for pain  If you begin to see any signs of infection such as redness, drainage, increased swelling or increased pain please return for reevaluation  If you have any concerns regarding healing please return for reevaluation

## 2023-07-08 NOTE — ED Triage Notes (Signed)
 Patient states she passed out last night and fell on shower track, right chin laceration

## 2023-07-08 NOTE — ED Provider Notes (Signed)
 [name]    CSN: [id number] Arrival date & time: 07/08/23  1748      History   Chief Complaint Chief Complaint  Patient presents with   Loss of Consciousness   Fall    HPI Chelsea Rivera is a 63 y.o. female.   Patient presents for evaluation of a laceration to the chin beginning 1 day ago after fall.  Was getting up to go to the bathroom overnight when she passed out hitting head along the metal railing shower/tub.  Has cleans, bleeding subsided.  Endorses syncopal episodes in the past related to dehydration, endorses over the past week that she has had poor intake due to being busy, sent syncopal episode has made efforts to drink fluids.  Denies dizziness, lightheadedness, visual disturbance or memory or speech changes, weakness.  Past Medical History:  Diagnosis Date   Diabetes mellitus without complication (HCC)    GERD (gastroesophageal reflux disease)    Hyperlipidemia    Hypertension    IDA (iron deficiency anemia) 01/27/2021    Patient Active Problem List   Diagnosis Date Noted   IDA (iron deficiency anemia) 01/27/2021   Insect bite (nonvenomous), left lower leg, initial encounter (CODE) 12/03/2019   Encounter for general adult medical examination with abnormal findings 06/06/2019   Chronic constipation 06/06/2019   Routine cervical smear 06/06/2019   Anemia in chronic kidney disease 06/04/2019   Benign hypertensive kidney disease with chronic kidney disease 06/04/2019   Gout 06/04/2019   Hypercalcemia 06/04/2019   Stage 3a chronic kidney disease (HCC) 06/04/2019   Hypomagnesemia 04/23/2019   Iron deficiency anemia 04/23/2019   Acute non-recurrent pansinusitis 10/20/2018   Nausea 08/26/2018   Abnormal weight gain 04/03/2018   Primary insomnia 04/03/2018   Dysuria 04/03/2018   Screening for colon cancer 12/14/2017   Uncontrolled type 2 diabetes mellitus with hyperglycemia (HCC) 06/19/2017   Small bowel disease 06/19/2017   Hypertension  associated with type 2 diabetes mellitus (HCC) 06/19/2017   Mixed hyperlipidemia 06/19/2017   Lateral epicondylitis of right elbow 08/29/2014   Right elbow pain 08/29/2014   Hyperkalemia 08/09/2013   Type 2 diabetes mellitus with diabetic chronic kidney disease (HCC) 08/09/2013    Past Surgical History:  Procedure Laterality Date   BREAST BIOPSY Right 2012   cleaned out fistula, not a biopsy   CHOLECYSTECTOMY     COLONOSCOPY WITH PROPOFOL N/A 07/07/2018   Procedure: COLONOSCOPY WITH PROPOFOL;  Surgeon: Christena Deem, MD;  Location: [facility] ENDOSCOPY;  Service: Endoscopy;  Laterality: N/A;   COLONOSCOPY WITH PROPOFOL N/A 07/27/2021   Procedure: COLONOSCOPY WITH PROPOFOL;  Surgeon: Jaynie Collins, DO;  Location: [facility] ENDOSCOPY;  Service: Gastroenterology;  Laterality: N/A;   ESOPHAGOGASTRODUODENOSCOPY (EGD) WITH PROPOFOL N/A 07/27/2021   Procedure: ESOPHAGOGASTRODUODENOSCOPY (EGD) WITH PROPOFOL;  Surgeon: Jaynie Collins, DO;  Location: [facility] ENDOSCOPY;  Service: Gastroenterology;  Laterality: N/A;   SHOULDER ARTHROSCOPY      OB History   No obstetric history on file.      Home Medications    Prior to Admission medications   Medication Sig Start Date End Date Taking? Authorizing Provider  aspirin EC 81 MG tablet Take 81 mg by mouth daily.    [provider]  atorvastatin (LIPITOR) 10 MG tablet TAKE 1/2 TABLET EVERYDAY FOR HIGH CHOLESTEROL 06/13/23   Sallyanne Kuster, NP  bisoprolol (ZEBETA) 5 MG tablet Take 1 tablet (5 mg total) by mouth at bedtime. 06/13/23   Sallyanne Kuster, NP  clotrimazole-betamethasone (LOTRISONE) cream Apply  1 application topically daily. 05/26/21   Sallyanne Kuster, NP  Continuous Blood Gluc Receiver (DEXCOM G6 RECEIVER) DEVI Use as directed for continuous glucose monitoring - E11.65 11/14/20   Lyndon Code, MD  Continuous Glucose Sensor (DEXCOM G6 SENSOR) MISC TO BE USED AS DIRECTED FOR CONTINUOUS GLUCOSE MONITORING. 02/09/23   Sallyanne Kuster, NP  Continuous Glucose Transmitter (DEXCOM G6 TRANSMITTER) MISC USE AS DIRECTED FOR CONTINUOUS GLUCOSE MONITORING. 10/20/22   Sallyanne Kuster, NP  dexamethasone (DECADRON) 0.5 MG/5ML solution Take 5 mLs (0.5 mg total) by mouth daily. SWISH AND SPIT 06/13/23   Sallyanne Kuster, NP  Diethylpropion HCl CR 75 MG TB24 TAKE ONE TABLET EVERY DAY BEFORE BREAKFAST 06/13/23   Sallyanne Kuster, NP  ferrous sulfate 325 (65 FE) MG EC tablet Take 1 tablet (325 mg total) by mouth 3 (three) times a week. 12/25/21   Sallyanne Kuster, NP  Insulin Aspart, w/Niacinamide, (FIASP) 100 UNIT/ML SOLN USE AS DIRECTED WITH INSULIN PUMP WITH OMNIPOD AND SLIDING SCALE **MAXIMUM OF 100 UNITS PER 72 hours** 06/18/23   Abernathy, Alyssa, NP  Insulin Disposable Pump (OMNIPOD DASH PODS, GEN 4,) MISC Inject into the skin. 06/02/23   [provider]  metFORMIN (GLUCOPHAGE) 1000 MG tablet Take 1 tablet (1,000 mg total) by mouth 2 (two) times daily. 06/13/23   Sallyanne Kuster, NP  omeprazole (PRILOSEC) 20 MG capsule Take 1 capsule (20 mg total) by mouth daily. 06/13/23   Sallyanne Kuster, NP  ondansetron (ZOFRAN) 8 MG tablet TAKE ONE TABLET BY MOUTH TWICE DAILY AS NEEDED FOR NAUSEA 06/13/23   Sallyanne Kuster, NP  patiromer Lelon Perla) 8.4 g packet  02/02/21   [provider]  predniSONE (STERAPRED UNI-PAK 21 TAB) 10 MG (21) TBPK tablet Use as directed for 6 days 06/13/23   Sallyanne Kuster, NP  tirzepatide [facility]) 12.5 MG/0.5ML Pen INJECT 1 SYRINGE (12.5MG ) SUBCUTANEOUSLY ONCE A WEEK 06/29/23   Sallyanne Kuster, NP  zolpidem (AMBIEN) 10 MG tablet Take 1 tablet (10 mg total) by mouth at bedtime as needed for sleep. 06/13/23   Sallyanne Kuster, NP    Family History Family History  Problem Relation Age of Onset   Lung cancer Mother    Colon cancer Father    Breast cancer Neg Hx     Social History Social History   Tobacco Use   Smoking status: Never   Smokeless tobacco: Never  Vaping Use   Vaping status:  Never Used  Substance Use Topics   Alcohol use: Yes    Comment: socially   Drug use: No     Allergies   Patient has no known allergies.   Review of Systems Review of Systems   Physical Exam Triage Vital Signs ED Triage Vitals  Encounter Vitals Group     BP 07/08/23 1801 (!) 112/50     Systolic BP Percentile --      Diastolic BP Percentile --      Pulse Rate 07/08/23 1801 82     Resp 07/08/23 1801 18     Temp 07/08/23 1801 98 F (36.7 C)     Temp Source 07/08/23 1801 Oral     SpO2 07/08/23 1801 97 %     Weight 07/08/23 1758 143 lb (64.9 kg)     Height 07/08/23 1758 5\' 4"  (1.626 m)     Head Circumference --      Peak Flow --      Pain Score 07/08/23 1758 2     Pain Loc --  Pain Education --      Exclude from Growth Chart --    No data found.  Updated Vital Signs BP (!) 112/50 (BP Location: Left Arm)   Pulse 82   Temp 98 F (36.7 C) (Oral)   Resp 18   Ht 5\' 4"  (1.626 m)   Wt 143 lb (64.9 kg)   SpO2 97%   BMI 24.55 kg/m   Visual Acuity Right Eye Distance:   Left Eye Distance:   Bilateral Distance:    Right Eye Near:   Left Eye Near:    Bilateral Near:     Physical Exam Constitutional:      Appearance: Normal appearance.  HENT:     Head:     Comments: 1 cm laceration present to the right side of the chin, 1 cm depth Eyes:     Extraocular Movements: Extraocular movements intact.  Pulmonary:     Effort: Pulmonary effort is normal.  Neurological:     General: No focal deficit present.     Mental Status: She is alert and oriented to person, place, and time. Mental status is at baseline.      UC Treatments / Results  Labs (all labs ordered are listed, but only abnormal results are displayed) Labs Reviewed - No data to display  EKG   Radiology No results found.  Procedures Laceration Repair  Date/Time: 07/08/2023 7:39 PM  Performed by: Valinda Hoar, NP Authorized by: Valinda Hoar, NP   Consent:    Consent obtained:   Verbal   Consent given by:  Patient   Risks, benefits, and alternatives were discussed: yes     Risks discussed:  Infection and pain Universal protocol:    Procedure explained and questions answered to patient or proxy's satisfaction: yes     Patient identity confirmed:  Verbally with patient Anesthesia:    Anesthesia method:  None Laceration details:    Location:  Face   Face location:  Chin   Length (cm):  1   Depth (mm):  1 Exploration:    Wound exploration: entire depth of wound visualized   Treatment:    Area cleansed with:  Chlorhexidine   Irrigation method:  Tap Skin repair:    Repair method:  Tissue adhesive Approximation:    Approximation:  Close Repair type:    Repair type:  Simple Post-procedure details:    Dressing:  Open (no dressing)   Procedure completion:  Tolerated  (including critical care time)  Medications Ordered in UC Medications - No data to display  Initial Impression / Assessment and Plan / UC Course  I have reviewed the triage vital signs and the nursing notes.  Pertinent labs & imaging results that were available during my care of the patient were reviewed by me and considered in my medical decision making (see chart for details).  Chin laceration, syncope and collapse  Vital signs stable, patient in no signs of respiratory distress.,  No neurological deficits on shows normal sinus rhythm, stable for outpatient management, advised to continue to increase fluid intake able to adhere laceration with skin adhesive, tolerated well, advised daily cleansing and  monitoring for signs of infection, may follow-up with urgent care or primary doctor for any further concerns Final Clinical Impressions(s) / UC Diagnoses   Final diagnoses:  Chin laceration, initial encounter  Syncope and collapse     Discharge Instructions      Today you are evaluated for the cut to your chin  EKG  shows that heart is beating regular   Has been reattached with  tissue adhesive, do not peel off, this will fall off with time  Cleanse daily with soap and water, pat and do not rub, may apply topical antibiotic ointment per preference  May take Tylenol and or Motrin as needed for pain  If you begin to see any signs of infection such as redness, drainage, increased swelling or increased pain please return for reevaluation  If you have any concerns regarding healing please return for reevaluation     ED Prescriptions   None    PDMP not reviewed this encounter.   Valinda Hoar, [place] 07/08/23 [phone]

## 2023-07-09 ENCOUNTER — Encounter: Payer: Self-pay | Admitting: Nurse Practitioner

## 2023-07-09 DIAGNOSIS — E1165 Type 2 diabetes mellitus with hyperglycemia: Secondary | ICD-10-CM

## 2023-07-11 MED ORDER — DEXCOM G6 SENSOR MISC: 1 refills | Status: DC

## 2023-09-05 ENCOUNTER — Other Ambulatory Visit: Payer: Self-pay | Admitting: Nurse Practitioner

## 2023-09-05 DIAGNOSIS — E1122 Type 2 diabetes mellitus with diabetic chronic kidney disease: Secondary | ICD-10-CM

## 2023-09-19 ENCOUNTER — Encounter: Payer: Self-pay | Admitting: Nurse Practitioner

## 2023-09-19 ENCOUNTER — Ambulatory Visit: Payer: Managed Care, Other (non HMO) | Admitting: Nurse Practitioner

## 2023-09-19 VITALS — BP 134/70 | HR 79 | Temp 97.6°F | Resp 16 | Ht 64.0 in | Wt 148.4 lb

## 2023-09-19 DIAGNOSIS — I152 Hypertension secondary to endocrine disorders: Secondary | ICD-10-CM

## 2023-09-19 DIAGNOSIS — E1165 Type 2 diabetes mellitus with hyperglycemia: Secondary | ICD-10-CM | POA: Diagnosis not present

## 2023-09-19 DIAGNOSIS — E663 Overweight: Secondary | ICD-10-CM

## 2023-09-19 DIAGNOSIS — E785 Hyperlipidemia, unspecified: Secondary | ICD-10-CM

## 2023-09-19 DIAGNOSIS — Z794 Long term (current) use of insulin: Secondary | ICD-10-CM

## 2023-09-19 DIAGNOSIS — E1169 Type 2 diabetes mellitus with other specified complication: Secondary | ICD-10-CM | POA: Diagnosis not present

## 2023-09-19 DIAGNOSIS — Z6825 Body mass index (BMI) 25.0-25.9, adult: Secondary | ICD-10-CM

## 2023-09-19 DIAGNOSIS — E1159 Type 2 diabetes mellitus with other circulatory complications: Secondary | ICD-10-CM

## 2023-09-19 DIAGNOSIS — F5101 Primary insomnia: Secondary | ICD-10-CM | POA: Diagnosis not present

## 2023-09-19 MED ORDER — BELSOMRA 10 MG PO TABS
10.0000 mg | ORAL_TABLET | Freq: Every evening | ORAL | 1 refills | Status: DC | PRN
Start: 2023-09-19 — End: 2023-09-26

## 2023-09-19 MED ORDER — DIETHYLPROPION HCL ER 75 MG PO TB24: ORAL_TABLET | ORAL | 2 refills | Status: DC

## 2023-09-19 NOTE — Progress Notes (Signed)
 [facility] [address] [place], [place] [address]  Internal MEDICINE  Office Visit Note  Patient Name: Chelsea Rivera  [id number]  [id number]  Date of Service: 09/19/2023  Chief Complaint  Patient presents with   Diabetes   Gastroesophageal Reflux   Hyperlipidemia   Hypertension   Follow-up    HPI Marchetta presents for a follow-up visit for diabetes, insomnia, GERD, weight loss.  Diabetes -- A1c is stable as of her previous office visit. Reviewed CGM via dexcom clarity and her omnipod. Omnipod basal rate from midnight to 6am is 0.25 units/hr and 0.75 units/hr from 6 am to midnight. Her average TTD of insulin  is 16.3 units per day. Due to all of the physical activity while on the cruise, she had to take the omnipod off for a couple of days  Insomnia -- tried to wean ambien  but still needing something to help her sleep at night.  GERD -- controlled with omeprazole .  Weight loss -- gained a few lbs while on her cruise. She remains under 150 lbs. She will continue diethylpropion .     Current Medication: Outpatient Encounter Medications as of 09/19/2023  Medication Sig   aspirin EC 81 MG tablet Take 81 mg by mouth daily.   atorvastatin  (LIPITOR) 10 MG tablet TAKE 1/2 TABLET EVERYDAY FOR HIGH CHOLESTEROL   bisoprolol  (ZEBETA ) 5 MG tablet Take 1 tablet (5 mg total) by mouth at bedtime.   clotrimazole -betamethasone  (LOTRISONE ) cream Apply 1 application topically daily.   Continuous Blood Gluc Receiver (DEXCOM G6 RECEIVER) DEVI Use as directed for continuous glucose monitoring - E11.65   Continuous Glucose Sensor (DEXCOM G6 SENSOR) MISC TO BE USED AS DIRECTED FOR CONTINUOUS GLUCOSE MONITORING.   Continuous Glucose Transmitter (DEXCOM G6 TRANSMITTER) MISC USE AS DIRECTED FOR CONTINUOUS GLUCOSE MONITORING.   dexamethasone  (DECADRON ) 0.5 MG/5ML solution Take 5 mLs (0.5 mg total) by mouth daily. SWISH AND SPIT   ferrous sulfate  325 (65 FE) MG EC tablet Take 1 tablet (325 mg  total) by mouth 3 (three) times a week.   Insulin  Aspart, w/Niacinamide, (FIASP ) 100 UNIT/ML SOLN USE AS DIRECTED WITH INSULIN  PUMP WITH OMNIPOD AND SLIDING SCALE **MAXIMUM OF 100 UNITS PER 72 hours**   Insulin  Disposable Pump (OMNIPOD DASH PODS, GEN 4,) MISC REPLACE POD EVERY 72 HOURS AS DIRECTED   metFORMIN  (GLUCOPHAGE ) 1000 MG tablet Take 1 tablet (1,000 mg total) by mouth 2 (two) times daily.   omeprazole  (PRILOSEC) 20 MG capsule Take 1 capsule (20 mg total) by mouth daily.   ondansetron  (ZOFRAN ) 8 MG tablet TAKE ONE TABLET BY MOUTH TWICE DAILY AS NEEDED FOR NAUSEA   patiromer (VELTASSA) 8.4 g packet    predniSONE  (STERAPRED UNI-PAK 21 TAB) 10 MG (21) TBPK tablet Use as directed for 6 days   Suvorexant  (BELSOMRA ) 10 MG TABS Take 1 tablet (10 mg total) by mouth at bedtime as needed (insomnia).   tirzepatide  (MOUNJARO ) 12.5 MG/0.5ML Pen INJECT 1 SYRINGE (12.5MG ) SUBCUTANEOUSLY ONCE A WEEK   zolpidem  (AMBIEN ) 10 MG tablet Take 1 tablet (10 mg total) by mouth at bedtime as needed for sleep.   [DISCONTINUED] Diethylpropion  HCl CR 75 MG TB24 TAKE ONE TABLET EVERY DAY BEFORE BREAKFAST   Diethylpropion  HCl CR 75 MG TB24 TAKE ONE TABLET EVERY DAY BEFORE BREAKFAST   No facility-administered encounter medications on file as of 09/19/2023.    Surgical History: Past Surgical History:  Procedure Laterality Date   BREAST BIOPSY Right 2012   cleaned out fistula, not a biopsy  CHOLECYSTECTOMY     COLONOSCOPY WITH PROPOFOL  N/A 07/07/2018   Procedure: COLONOSCOPY WITH PROPOFOL ;  Surgeon: Deveron Fly, MD;  Location: [facility] ENDOSCOPY;  Service: Endoscopy;  Laterality: N/A;   COLONOSCOPY WITH PROPOFOL  N/A 07/27/2021   Procedure: COLONOSCOPY WITH PROPOFOL ;  Surgeon: Quintin Buckle, DO;  Location: [facility] ENDOSCOPY;  Service: Gastroenterology;  Laterality: N/A;   ESOPHAGOGASTRODUODENOSCOPY (EGD) WITH PROPOFOL  N/A 07/27/2021   Procedure: ESOPHAGOGASTRODUODENOSCOPY (EGD) WITH PROPOFOL ;  Surgeon: Quintin Buckle, DO;  Location: [facility] ENDOSCOPY;  Service: Gastroenterology;  Laterality: N/A;   SHOULDER ARTHROSCOPY      Medical History: Past Medical History:  Diagnosis Date   Diabetes mellitus without complication (HCC)    GERD (gastroesophageal reflux disease)    Hyperlipidemia    Hypertension    IDA (iron  deficiency anemia) 01/27/2021    Family History: Family History  Problem Relation Age of Onset   Lung cancer Mother    Colon cancer Father    Breast cancer Neg Hx     Social History   Socioeconomic History   Marital status: Married    Spouse name: Not on file   Number of children: Not on file   Years of education: Not on file   Highest education level: Not on file  Occupational History   Not on file  Tobacco Use   Smoking status: Never   Smokeless tobacco: Never  Vaping Use   Vaping status: Never Used  Substance and Sexual Activity   Alcohol use: Yes    Comment: socially   Drug use: No   Sexual activity: Not on file  Other Topics Concern   Not on file  Social History Narrative   Not on file   Social Drivers of Health   Financial Resource Strain: Not on file  Food Insecurity: Not on file  Transportation Needs: Not on file  Physical Activity: Not on file  Stress: Not on file  Social Connections: Not on file  Intimate Partner Violence: Not on file      Review of Systems  Constitutional:  Negative for chills, fatigue and unexpected weight change.  HENT:  Negative for congestion, rhinorrhea, sneezing and sore throat.   Eyes:  Negative for redness.  Respiratory: Negative.  Negative for cough, chest tightness, shortness of breath and wheezing.   Cardiovascular: Negative.  Negative for chest pain and palpitations.  Gastrointestinal:  Negative for abdominal pain, constipation, diarrhea, nausea and vomiting.  Genitourinary:  Negative for dysuria and frequency.  Musculoskeletal:  Negative for arthralgias, back pain, joint swelling and neck pain.  Skin:   Negative for rash.  Neurological: Negative.  Negative for tremors and numbness.  Hematological:  Negative for adenopathy. Does not bruise/bleed easily.  Psychiatric/Behavioral:  Negative for behavioral problems (Depression), sleep disturbance and suicidal ideas. The patient is not nervous/anxious.     Vital Signs: BP 134/70   Pulse 79   Temp 97.6 F (36.4 C)   Resp 16   Ht 5\' 4"  (1.626 m)   Wt 148 lb 6.4 oz (67.3 kg)   SpO2 98%   BMI 25.47 kg/m    Physical Exam Vitals reviewed.  Constitutional:      General: She is not in acute distress.    Appearance: Normal appearance. She is not ill-appearing.  HENT:     Head: Normocephalic and atraumatic.  Eyes:     Pupils: Pupils are equal, round, and reactive to light.  Cardiovascular:     Rate and Rhythm: Normal rate and regular rhythm.  Pulmonary:     Effort: Pulmonary effort is normal. No respiratory distress.  Neurological:     Mental Status: She is alert and oriented to person, place, and time.  Psychiatric:        Mood and Affect: Mood normal.        Behavior: Behavior normal.       Assessment/Plan: 1. Type 2 diabetes mellitus with hyperglycemia, with long-term current use of insulin  (HCC) (Primary) Stable, continue insulin , mounjaro  and metformin  as prescribed.   2. Hypertension associated with type 2 diabetes mellitus (HCC) Stable, continue bisoprolol  as prescribed.   3. Hyperlipidemia associated with type 2 diabetes mellitus (HCC) Continue atorvastatin  as prescribed.   4. Primary insomnia Will try belsomra  instead of ambien .  - Suvorexant  (BELSOMRA ) 10 MG TABS; Take 1 tablet (10 mg total) by mouth at bedtime as needed (insomnia).  Dispense: 30 tablet; Refill: 1  5. Overweight with body mass index (BMI) of 25 to 25.9 in adult Continue diethylpropion  as prescribed.  - Diethylpropion  HCl CR 75 MG TB24; TAKE ONE TABLET EVERY DAY BEFORE BREAKFAST  Dispense: 30 tablet; Refill: 2   General Counseling: sharesa kemp understanding of the findings of todays visit and agrees with plan of treatment. I have discussed any further diagnostic evaluation that may be needed or ordered today. We also reviewed her medications today. she has been encouraged to call the office with any questions or concerns that should arise related to todays visit.    No orders of the defined types were placed in this encounter.   Meds ordered this encounter  Medications   Suvorexant  (BELSOMRA ) 10 MG TABS    Sig: Take 1 tablet (10 mg total) by mouth at bedtime as needed (insomnia).    Dispense:  30 tablet    Refill:  1    Discontinue ambien , fill new script today, please send prior auth request if required   Diethylpropion  HCl CR 75 MG TB24    Sig: TAKE ONE TABLET EVERY DAY BEFORE BREAKFAST    Dispense:  30 tablet    Refill:  2    FOR FUTURE REFILL PLEASE    Return in about 4 weeks (around 10/17/2023) for F/U, eval new med, Kolby Myung PCP belsomra .   Total time spent:30 Minutes Time spent includes review of chart, medications, test results, and follow up plan with the patient.   [place] Controlled Substance Database was reviewed by me.  This patient was seen by Laurence Pons, FNP-C in collaboration with Dr. Verneta Gone as a part of collaborative care agreement.   Tallin Hart R. Bobbi Burow, MSN, FNP-C Internal medicine

## 2023-09-20 ENCOUNTER — Encounter: Payer: Self-pay | Admitting: Nurse Practitioner

## 2023-09-20 ENCOUNTER — Other Ambulatory Visit: Payer: Self-pay | Admitting: Internal Medicine

## 2023-09-20 DIAGNOSIS — Z1231 Encounter for screening mammogram for malignant neoplasm of breast: Secondary | ICD-10-CM

## 2023-09-26 ENCOUNTER — Other Ambulatory Visit: Payer: Self-pay | Admitting: Nurse Practitioner

## 2023-09-26 ENCOUNTER — Encounter: Payer: Self-pay | Admitting: Nurse Practitioner

## 2023-09-26 MED ORDER — BELSOMRA 15 MG PO TABS: 15.0000 mg | ORAL_TABLET | Freq: Every evening | ORAL | 1 refills | Status: DC | PRN

## 2023-09-27 ENCOUNTER — Other Ambulatory Visit: Payer: Self-pay | Admitting: Nurse Practitioner

## 2023-09-27 DIAGNOSIS — E1122 Type 2 diabetes mellitus with diabetic chronic kidney disease: Secondary | ICD-10-CM

## 2023-09-29 ENCOUNTER — Encounter: Payer: Self-pay | Admitting: Oncology

## 2023-09-29 ENCOUNTER — Ambulatory Visit
Admission: RE | Admit: 2023-09-29 | Discharge: 2023-09-29 | Disposition: A | Discharge status: Home / Self Care | Source: Ambulatory Visit | Attending: Internal Medicine | Admitting: Internal Medicine

## 2023-09-29 DIAGNOSIS — Z1231 Encounter for screening mammogram for malignant neoplasm of breast: Secondary | ICD-10-CM | POA: Diagnosis present

## 2023-10-17 ENCOUNTER — Encounter: Payer: Self-pay | Admitting: Nurse Practitioner

## 2023-10-17 ENCOUNTER — Ambulatory Visit (INDEPENDENT_AMBULATORY_CARE_PROVIDER_SITE_OTHER): Admitting: Nurse Practitioner

## 2023-10-17 VITALS — BP 134/68 | HR 88 | Temp 97.9°F | Resp 16 | Ht 64.0 in | Wt 147.2 lb

## 2023-10-17 DIAGNOSIS — I152 Hypertension secondary to endocrine disorders: Secondary | ICD-10-CM

## 2023-10-17 DIAGNOSIS — E1159 Type 2 diabetes mellitus with other circulatory complications: Secondary | ICD-10-CM

## 2023-10-17 DIAGNOSIS — F5101 Primary insomnia: Secondary | ICD-10-CM | POA: Diagnosis not present

## 2023-10-17 DIAGNOSIS — Z6825 Body mass index (BMI) 25.0-25.9, adult: Secondary | ICD-10-CM

## 2023-10-17 DIAGNOSIS — E785 Hyperlipidemia, unspecified: Secondary | ICD-10-CM

## 2023-10-17 DIAGNOSIS — E1169 Type 2 diabetes mellitus with other specified complication: Secondary | ICD-10-CM

## 2023-10-17 DIAGNOSIS — E663 Overweight: Secondary | ICD-10-CM

## 2023-10-17 DIAGNOSIS — Z794 Long term (current) use of insulin: Secondary | ICD-10-CM | POA: Diagnosis not present

## 2023-10-17 DIAGNOSIS — Z79899 Other long term (current) drug therapy: Secondary | ICD-10-CM

## 2023-10-17 LAB — POCT GLYCOSYLATED HEMOGLOBIN (HGB A1C): Hemoglobin A1C: 6.1 % — AB (ref 4.0–5.6)

## 2023-10-17 MED ORDER — DIETHYLPROPION HCL ER 75 MG PO TB24: ORAL_TABLET | ORAL | 0 refills | Status: DC

## 2023-10-17 MED ORDER — OMEPRAZOLE 20 MG PO CPDR: 20.0000 mg | DELAYED_RELEASE_CAPSULE | Freq: Every day | ORAL | 1 refills | Status: DC

## 2023-10-17 MED ORDER — ZALEPLON 10 MG PO CAPS: 10.0000 mg | ORAL_CAPSULE | Freq: Every evening | ORAL | 2 refills | Status: DC | PRN

## 2023-10-17 NOTE — Progress Notes (Signed)
 [facility] [address] [place], [place] [address]  Internal MEDICINE  Office Visit Note  Patient Name: Chelsea Rivera  [id number]  [id number]  Date of Service: 10/17/2023  Chief Complaint  Patient presents with   Diabetes   Gastroesophageal Reflux   Hypertension   Hyperlipidemia   Follow-up    HPI Chelsea Rivera presents for a follow-up visit for diabetes, GERD, insomnia and hypertension Diabetes -- A1c is stable at 6.1 today. Average insulin  use is about 13.3 units per day over the past 30 days. Dexcom clarity GERD -- no issues with decreased dose, wants to try going without it.  Insomnia -- belsomra  did not help, wants to try something else.  Hypertension --controlled with bisoprolol .  High cholesterol -- taking atorvastatin     Current Medication: Outpatient Encounter Medications as of 10/17/2023  Medication Sig   aspirin EC 81 MG tablet Take 81 mg by mouth daily.   atorvastatin  (LIPITOR) 10 MG tablet TAKE 1/2 TABLET EVERYDAY FOR HIGH CHOLESTEROL   bisoprolol  (ZEBETA ) 5 MG tablet Take 1 tablet (5 mg total) by mouth at bedtime.   clotrimazole -betamethasone  (LOTRISONE ) cream Apply 1 application topically daily.   Continuous Blood Gluc Receiver (DEXCOM G6 RECEIVER) DEVI Use as directed for continuous glucose monitoring - E11.65   Continuous Glucose Sensor (DEXCOM G6 SENSOR) MISC TO BE USED AS DIRECTED FOR CONTINUOUS GLUCOSE MONITORING.   Continuous Glucose Transmitter (DEXCOM G6 TRANSMITTER) MISC USE AS DIRECTED FOR CONTINUOUS GLUCOSE MONITORING.   dexamethasone  (DECADRON ) 0.5 MG/5ML solution Take 5 mLs (0.5 mg total) by mouth daily. SWISH AND SPIT   ferrous sulfate  325 (65 FE) MG EC tablet Take 1 tablet (325 mg total) by mouth 3 (three) times a week.   Insulin  Aspart, w/Niacinamide, (FIASP ) 100 UNIT/ML SOLN USE AS DIRECTED WITH INSULIN  PUMP WITH OMNIPOD AND SLIDING SCALE **MAXIMUM OF 100 UNITS PER 72 hours**   Insulin  Disposable Pump (OMNIPOD DASH PODS, GEN 4,) MISC  REPLACE POD EVERY 72 HOURS AS DIRECTED   metFORMIN  (GLUCOPHAGE ) 1000 MG tablet Take 1 tablet (1,000 mg total) by mouth 2 (two) times daily.   ondansetron  (ZOFRAN ) 8 MG tablet TAKE ONE TABLET BY MOUTH TWICE DAILY AS NEEDED FOR NAUSEA   patiromer (VELTASSA) 8.4 g packet    Suvorexant  (BELSOMRA ) 15 MG TABS Take 1 tablet (15 mg total) by mouth at bedtime as needed.   tirzepatide  (MOUNJARO ) 12.5 MG/0.5ML Pen INJECT 1 DOSE SUBCUTANEOUSLY ONCE A WEEK   zaleplon  (SONATA ) 10 MG capsule Take 1 capsule (10 mg total) by mouth at bedtime as needed for sleep.   [DISCONTINUED] Diethylpropion  HCl CR 75 MG TB24 TAKE ONE TABLET EVERY DAY BEFORE BREAKFAST   [DISCONTINUED] omeprazole  (PRILOSEC) 20 MG capsule Take 1 capsule (20 mg total) by mouth daily.   [DISCONTINUED] predniSONE  (STERAPRED UNI-PAK 21 TAB) 10 MG (21) TBPK tablet Use as directed for 6 days   Diethylpropion  HCl CR 75 MG TB24 TAKE ONE TABLET EVERY DAY BEFORE BREAKFAST   omeprazole  (PRILOSEC) 20 MG capsule Take 1 capsule (20 mg total) by mouth daily.   No facility-administered encounter medications on file as of 10/17/2023.    Surgical History: Past Surgical History:  Procedure Laterality Date   BREAST BIOPSY Right 2012   cleaned out fistula, not a biopsy   CHOLECYSTECTOMY     COLONOSCOPY WITH PROPOFOL  N/A 07/07/2018   Procedure: COLONOSCOPY WITH PROPOFOL ;  Surgeon: Gaylyn Gladis PENNER, MD;  Location: [facility] ENDOSCOPY;  Service: Endoscopy;  Laterality: N/A;   COLONOSCOPY WITH PROPOFOL  N/A 07/27/2021  Procedure: COLONOSCOPY WITH PROPOFOL ;  Surgeon: Onita Elspeth Sharper, DO;  Location: [facility] ENDOSCOPY;  Service: Gastroenterology;  Laterality: N/A;   ESOPHAGOGASTRODUODENOSCOPY (EGD) WITH PROPOFOL  N/A 07/27/2021   Procedure: ESOPHAGOGASTRODUODENOSCOPY (EGD) WITH PROPOFOL ;  Surgeon: Onita Elspeth Sharper, DO;  Location: [facility] ENDOSCOPY;  Service: Gastroenterology;  Laterality: N/A;   SHOULDER ARTHROSCOPY      Medical History: Past Medical History:   Diagnosis Date   Diabetes mellitus without complication (HCC)    GERD (gastroesophageal reflux disease)    Hyperlipidemia    Hypertension    IDA (iron  deficiency anemia) 01/27/2021    Family History: Family History  Problem Relation Age of Onset   Lung cancer Mother    Colon cancer Father    Breast cancer Neg Hx     Social History   Socioeconomic History   Marital status: Married    Spouse name: Not on file   Number of children: Not on file   Years of education: Not on file   Highest education level: Not on file  Occupational History   Not on file  Tobacco Use   Smoking status: Never   Smokeless tobacco: Never  Vaping Use   Vaping status: Never Used  Substance and Sexual Activity   Alcohol use: Yes    Comment: socially   Drug use: No   Sexual activity: Not on file  Other Topics Concern   Not on file  Social History Narrative   Not on file   Social Drivers of Health   Financial Resource Strain: Not on file  Food Insecurity: Not on file  Transportation Needs: Not on file  Physical Activity: Not on file  Stress: Not on file  Social Connections: Not on file  Intimate Partner Violence: Not on file      Review of Systems  Constitutional:  Negative for chills, fatigue and unexpected weight change.  HENT:  Negative for congestion, rhinorrhea, sneezing and sore throat.   Eyes:  Negative for redness.  Respiratory: Negative.  Negative for cough, chest tightness, shortness of breath and wheezing.   Cardiovascular: Negative.  Negative for chest pain and palpitations.  Gastrointestinal:  Negative for abdominal pain, constipation, diarrhea, nausea and vomiting.  Genitourinary:  Negative for dysuria and frequency.  Musculoskeletal:  Negative for arthralgias, back pain, joint swelling and neck pain.  Skin:  Negative for rash.  Neurological: Negative.  Negative for tremors and numbness.  Hematological:  Negative for adenopathy. Does not bruise/bleed easily.   Psychiatric/Behavioral:  Negative for behavioral problems (Depression), sleep disturbance and suicidal ideas. The patient is not nervous/anxious.     Vital Signs: BP 134/68   Pulse 88   Temp 97.9 F (36.6 C)   Resp 16   Ht 5' 4 (1.626 m)   Wt 147 lb 3.2 oz (66.8 kg)   SpO2 97%   BMI 25.27 kg/m    Physical Exam Vitals reviewed.  Constitutional:      General: She is not in acute distress.    Appearance: Normal appearance. She is not ill-appearing.  HENT:     Head: Normocephalic and atraumatic.  Eyes:     Pupils: Pupils are equal, round, and reactive to light.  Cardiovascular:     Rate and Rhythm: Normal rate and regular rhythm.  Pulmonary:     Effort: Pulmonary effort is normal. No respiratory distress.  Neurological:     Mental Status: She is alert and oriented to person, place, and time.  Psychiatric:  Mood and Affect: Mood normal.        Behavior: Behavior normal.        Assessment/Plan: 1. Type 2 diabetes mellitus with other specified complication, with long-term current use of insulin  (HCC) (Primary) A1c is stable, continue  metformin , mounjaro  and using insulin  pump.  - POCT glycosylated hemoglobin (Hb A1C)  2. Hypertension associated with type 2 diabetes mellitus (HCC) Stable, continue bisoprolol  as prescribed.   3. Hyperlipidemia associated with type 2 diabetes mellitus (HCC) Continue atorvastatin  as prescribed.   4. Overweight with body mass index (BMI) of 25 to 25.9 in adult Continue diethylpropion  as prescribed.  - Diethylpropion  HCl CR 75 MG TB24; TAKE ONE TABLET EVERY DAY BEFORE BREAKFAST  Dispense: 30 tablet; Refill: 0  5. Primary insomnia Ambien  discontinued, will try zaleplon  as prescribed.  - zaleplon  (SONATA ) 10 MG capsule; Take 1 capsule (10 mg total) by mouth at bedtime as needed for sleep.  Dispense: 30 capsule; Refill: 2  6. Encounter for medication review Medication list reviewed, updated, and refills ordered  - omeprazole   (PRILOSEC) 20 MG capsule; Take 1 capsule (20 mg total) by mouth daily.  Dispense: 90 capsule; Refill: 1   General Counseling: danny zimny understanding of the findings of todays visit and agrees with plan of treatment. I have discussed any further diagnostic evaluation that may be needed or ordered today. We also reviewed her medications today. she has been encouraged to call the office with any questions or concerns that should arise related to todays visit.    Orders Placed This Encounter  Procedures   POCT glycosylated hemoglobin (Hb A1C)    Meds ordered this encounter  Medications   zaleplon  (SONATA ) 10 MG capsule    Sig: Take 1 capsule (10 mg total) by mouth at bedtime as needed for sleep.    Dispense:  30 capsule    Refill:  2    Discontinue belsomra  and fill new script today.   omeprazole  (PRILOSEC) 20 MG capsule    Sig: Take 1 capsule (20 mg total) by mouth daily.    Dispense:  90 capsule    Refill:  1    For next fill   Diethylpropion  HCl CR 75 MG TB24    Sig: TAKE ONE TABLET EVERY DAY BEFORE BREAKFAST    Dispense:  30 tablet    Refill:  0    FOR FUTURE REFILL PLEASE, keep on file    Return in about 3 months (around 01/10/2024) for F/U, Vishal Sandlin PCP, med refills sleep med.   Total time spent:30 Minutes Time spent includes review of chart, medications, test results, and follow up plan with the patient.   [place] Controlled Substance Database was reviewed by me.  This patient was seen by Mardy Maxin, FNP-C in collaboration with Dr. Sigrid Bathe as a part of collaborative care agreement.   Candon Caras R. Maxin, MSN, FNP-C Internal medicine

## 2023-11-07 ENCOUNTER — Encounter: Payer: Self-pay | Admitting: Nurse Practitioner

## 2023-11-14 ENCOUNTER — Other Ambulatory Visit: Payer: Self-pay | Admitting: Nurse Practitioner

## 2023-11-14 DIAGNOSIS — E1165 Type 2 diabetes mellitus with hyperglycemia: Secondary | ICD-10-CM

## 2024-01-04 ENCOUNTER — Encounter: Payer: Self-pay | Admitting: Oncology

## 2024-01-11 ENCOUNTER — Encounter: Payer: Self-pay | Admitting: Nurse Practitioner

## 2024-01-11 ENCOUNTER — Ambulatory Visit: Admitting: Nurse Practitioner

## 2024-01-11 VITALS — BP 118/64 | HR 95 | Temp 96.4°F | Resp 16 | Ht 64.0 in | Wt 146.4 lb

## 2024-01-11 DIAGNOSIS — D229 Melanocytic nevi, unspecified: Secondary | ICD-10-CM

## 2024-01-11 DIAGNOSIS — E1169 Type 2 diabetes mellitus with other specified complication: Secondary | ICD-10-CM | POA: Diagnosis not present

## 2024-01-11 DIAGNOSIS — E663 Overweight: Secondary | ICD-10-CM | POA: Diagnosis not present

## 2024-01-11 DIAGNOSIS — E1159 Type 2 diabetes mellitus with other circulatory complications: Secondary | ICD-10-CM

## 2024-01-11 DIAGNOSIS — Z794 Long term (current) use of insulin: Secondary | ICD-10-CM

## 2024-01-11 DIAGNOSIS — Z6825 Body mass index (BMI) 25.0-25.9, adult: Secondary | ICD-10-CM

## 2024-01-11 DIAGNOSIS — I152 Hypertension secondary to endocrine disorders: Secondary | ICD-10-CM

## 2024-01-11 DIAGNOSIS — F5101 Primary insomnia: Secondary | ICD-10-CM

## 2024-01-11 MED ORDER — DIETHYLPROPION HCL ER 75 MG PO TB24: ORAL_TABLET | ORAL | 2 refills | Status: DC

## 2024-01-11 MED ORDER — DEXCOM G6 SENSOR MISC: 1 refills | Status: DC

## 2024-01-11 MED ORDER — ZALEPLON 10 MG PO CAPS: 10.0000 mg | ORAL_CAPSULE | Freq: Every evening | ORAL | 2 refills | Status: DC | PRN

## 2024-01-11 MED ORDER — INSULIN LISPRO 100 UNIT/ML IJ SOLN: INTRAMUSCULAR | 3 refills | Status: AC

## 2024-01-11 NOTE — Progress Notes (Signed)
 [facility] [address] [place], [place] [address]  Internal MEDICINE  Office Visit Note  Patient Name: Chelsea Rivera  [id number]  [id number]  Date of Service: 01/11/2024  Chief Complaint  Patient presents with   Diabetes   Hyperlipidemia   Hypertension   Gastroesophageal Reflux   Follow-up    HPI Yancy presents for a follow-up visit for a changing mole, diabetes, and insomnia. Changing mole on left leg, has appt with dermatology in January Diabetes -- using about 12.2 units per day via omnipod and currently taking mounjaro  12.5 mg weekly. Average glucose and estimated A1c via dexcom reports is in the low 6s. She has had to adjust her basal insulin  dose slightly.  Insomnia -- zaleplon  is helping with sleep and is effective for her. Wants to continue this medication. Appetite control and weight control, takes diethylpropion  to help control her appetite.     Current Medication: Outpatient Encounter Medications as of 01/11/2024  Medication Sig   insulin  lispro (HUMALOG ) 100 UNIT/ML injection USE AS DIRECTED WITH INSULIN  PUMP WITH OMNIPOD AND SLIDING SCALE **MAXIMUM OF 100 UNITS PER 72 hours**   aspirin EC 81 MG tablet Take 81 mg by mouth daily.   atorvastatin  (LIPITOR) 10 MG tablet TAKE 1/2 TABLET EVERYDAY FOR HIGH CHOLESTEROL   bisoprolol  (ZEBETA ) 5 MG tablet Take 1 tablet (5 mg total) by mouth at bedtime.   clotrimazole -betamethasone  (LOTRISONE ) cream Apply 1 application topically daily.   Continuous Blood Gluc Receiver (DEXCOM G6 RECEIVER) DEVI Use as directed for continuous glucose monitoring - E11.65   Continuous Glucose Sensor (DEXCOM G6 SENSOR) MISC TO BE USED AS DIRECTED FOR CONTINUOUS GLUCOSE MONITORING.   Continuous Glucose Transmitter (DEXCOM G6 TRANSMITTER) MISC USE AS DIRECTED FOR CONTINUOUS GLUCOSE MONITORING   dexamethasone  (DECADRON ) 0.5 MG/5ML solution Take 5 mLs (0.5 mg total) by mouth daily. SWISH AND SPIT   Diethylpropion  HCl CR 75 MG TB24 TAKE ONE  TABLET EVERY DAY BEFORE BREAKFAST   ferrous sulfate  325 (65 FE) MG EC tablet Take 1 tablet (325 mg total) by mouth 3 (three) times a week.   Insulin  Disposable Pump (OMNIPOD DASH PODS, GEN 4,) MISC REPLACE POD EVERY 72 HOURS AS DIRECTED   metFORMIN  (GLUCOPHAGE ) 1000 MG tablet Take 1 tablet (1,000 mg total) by mouth 2 (two) times daily.   omeprazole  (PRILOSEC) 20 MG capsule Take 1 capsule (20 mg total) by mouth daily.   ondansetron  (ZOFRAN ) 8 MG tablet TAKE ONE TABLET BY MOUTH TWICE DAILY AS NEEDED FOR NAUSEA   patiromer (VELTASSA) 8.4 g packet    tirzepatide  (MOUNJARO ) 12.5 MG/0.5ML Pen INJECT 1 DOSE SUBCUTANEOUSLY ONCE A WEEK   zaleplon  (SONATA ) 10 MG capsule Take 1 capsule (10 mg total) by mouth at bedtime as needed for sleep.   [DISCONTINUED] Continuous Glucose Sensor (DEXCOM G6 SENSOR) MISC TO BE USED AS DIRECTED FOR CONTINUOUS GLUCOSE MONITORING.   [DISCONTINUED] Diethylpropion  HCl CR 75 MG TB24 TAKE ONE TABLET EVERY DAY BEFORE BREAKFAST   [DISCONTINUED] Insulin  Aspart, w/Niacinamide, (FIASP ) 100 UNIT/ML SOLN USE AS DIRECTED WITH INSULIN  PUMP WITH OMNIPOD AND SLIDING SCALE **MAXIMUM OF 100 UNITS PER 72 hours**   [DISCONTINUED] zaleplon  (SONATA ) 10 MG capsule Take 1 capsule (10 mg total) by mouth at bedtime as needed for sleep.   No facility-administered encounter medications on file as of 01/11/2024.    Surgical History: Past Surgical History:  Procedure Laterality Date   BREAST BIOPSY Right 2012   cleaned out fistula, not a biopsy   CHOLECYSTECTOMY  COLONOSCOPY WITH PROPOFOL  N/A 07/07/2018   Procedure: COLONOSCOPY WITH PROPOFOL ;  Surgeon: Gaylyn Gladis PENNER, MD;  Location: [facility] ENDOSCOPY;  Service: Endoscopy;  Laterality: N/A;   COLONOSCOPY WITH PROPOFOL  N/A 07/27/2021   Procedure: COLONOSCOPY WITH PROPOFOL ;  Surgeon: Onita Elspeth Sharper, DO;  Location: [facility] ENDOSCOPY;  Service: Gastroenterology;  Laterality: N/A;   ESOPHAGOGASTRODUODENOSCOPY (EGD) WITH PROPOFOL  N/A 07/27/2021    Procedure: ESOPHAGOGASTRODUODENOSCOPY (EGD) WITH PROPOFOL ;  Surgeon: Onita Elspeth Sharper, DO;  Location: [facility] ENDOSCOPY;  Service: Gastroenterology;  Laterality: N/A;   SHOULDER ARTHROSCOPY      Medical History: Past Medical History:  Diagnosis Date   Diabetes mellitus without complication (HCC)    GERD (gastroesophageal reflux disease)    Hyperlipidemia    Hypertension    IDA (iron  deficiency anemia) 01/27/2021    Family History: Family History  Problem Relation Age of Onset   Lung cancer Mother    Colon cancer Father    Breast cancer Neg Hx     Social History   Socioeconomic History   Marital status: Married    Spouse name: Not on file   Number of children: Not on file   Years of education: Not on file   Highest education level: Not on file  Occupational History   Not on file  Tobacco Use   Smoking status: Never   Smokeless tobacco: Never  Vaping Use   Vaping status: Never Used  Substance and Sexual Activity   Alcohol use: Yes    Comment: socially   Drug use: No   Sexual activity: Not on file  Other Topics Concern   Not on file  Social History Narrative   Not on file   Social Drivers of Health   Financial Resource Strain: Not on file  Food Insecurity: Not on file  Transportation Needs: Not on file  Physical Activity: Not on file  Stress: Not on file  Social Connections: Not on file  Intimate Partner Violence: Not on file      Review of Systems  Constitutional:  Negative for chills, fatigue and unexpected weight change.  HENT:  Negative for congestion, rhinorrhea, sneezing and sore throat.   Eyes:  Negative for redness.  Respiratory: Negative.  Negative for cough, chest tightness, shortness of breath and wheezing.   Cardiovascular: Negative.  Negative for chest pain and palpitations.  Gastrointestinal:  Negative for abdominal pain, constipation, diarrhea, nausea and vomiting.  Genitourinary:  Negative for dysuria and frequency.  Musculoskeletal:   Negative for arthralgias, back pain, joint swelling and neck pain.  Skin:  Negative for rash.  Neurological: Negative.  Negative for tremors and numbness.  Hematological:  Negative for adenopathy. Does not bruise/bleed easily.  Psychiatric/Behavioral:  Negative for behavioral problems (Depression), sleep disturbance and suicidal ideas. The patient is not nervous/anxious.     Vital Signs: BP 118/64   Pulse 95   Temp (!) 96.4 F (35.8 C)   Resp 16   Ht 5' 4 (1.626 m)   Wt 146 lb 6.4 oz (66.4 kg)   SpO2 99%   BMI 25.13 kg/m    Physical Exam Vitals reviewed.  Constitutional:      General: She is not in acute distress.    Appearance: Normal appearance. She is not ill-appearing.  HENT:     Head: Normocephalic and atraumatic.  Eyes:     Pupils: Pupils are equal, round, and reactive to light.  Cardiovascular:     Rate and Rhythm: Normal rate and regular rhythm.  Pulmonary:  Effort: Pulmonary effort is normal. No respiratory distress.  Neurological:     Mental Status: She is alert and oriented to person, place, and time.  Psychiatric:        Mood and Affect: Mood normal.        Behavior: Behavior normal.        Assessment/Plan: 1. Type 2 diabetes mellitus with other specified complication, with long-term current use of insulin  (HCC) (Primary) Insulin  changed to lispro for insurance coverage changes. Continue omnipod use as directed.  - insulin  lispro (HUMALOG ) 100 UNIT/ML injection; USE AS DIRECTED WITH INSULIN  PUMP WITH OMNIPOD AND SLIDING SCALE **MAXIMUM OF 100 UNITS PER 72 hours**  Dispense: 30 mL; Refill: 3 - Continuous Glucose Sensor (DEXCOM G6 SENSOR) MISC; TO BE USED AS DIRECTED FOR CONTINUOUS GLUCOSE MONITORING.  Dispense: 9 each; Refill: 1  2. Hypertension associated with type 2 diabetes mellitus (HCC) Continue medications as prescribed.  3. Overweight with body mass index (BMI) of 25 to 25.9 in adult Continue diethylpropion  as prescribed. Follow up in 3  months for additional refills.  - Diethylpropion  HCl CR 75 MG TB24; TAKE ONE TABLET EVERY DAY BEFORE BREAKFAST  Dispense: 30 tablet; Refill: 2  4. Change in mole Has upcoming appt with dermatology in January.   5. Primary insomnia Continue zaleplon  as prescribed, follow up in 3 months for additional refills. - zaleplon  (SONATA ) 10 MG capsule; Take 1 capsule (10 mg total) by mouth at bedtime as needed for sleep.  Dispense: 30 capsule; Refill: 2   General Counseling: virgen belland understanding of the findings of todays visit and agrees with plan of treatment. I have discussed any further diagnostic evaluation that may be needed or ordered today. We also reviewed her medications today. she has been encouraged to call the office with any questions or concerns that should arise related to todays visit.    No orders of the defined types were placed in this encounter.   Meds ordered this encounter  Medications   insulin  lispro (HUMALOG ) 100 UNIT/ML injection    Sig: USE AS DIRECTED WITH INSULIN  PUMP WITH OMNIPOD AND SLIDING SCALE **MAXIMUM OF 100 UNITS PER 72 hours**    Dispense:  30 mL    Refill:  3    Dx code E11.65, discontinue fiasp  and fill new script today due to insurance preferences   Continuous Glucose Sensor (DEXCOM G6 SENSOR) MISC    Sig: TO BE USED AS DIRECTED FOR CONTINUOUS GLUCOSE MONITORING.    Dispense:  9 each    Refill:  1    Please fill as 3 month supply, [place]   zaleplon  (SONATA ) 10 MG capsule    Sig: Take 1 capsule (10 mg total) by mouth at bedtime as needed for sleep.    Dispense:  30 capsule    Refill:  2    For future refills   Diethylpropion  HCl CR 75 MG TB24    Sig: TAKE ONE TABLET EVERY DAY BEFORE BREAKFAST    Dispense:  30 tablet    Refill:  2    FOR FUTURE REFILL PLEASE, keep on file    Return for previously scheduled, AWV, Ranada Vigorito PCP in december.   Total time spent:30 Minutes Time spent includes review of  chart, medications, test results, and follow up plan with the patient.   [place] Controlled Substance Database was reviewed by me.  This patient was seen by Mardy Maxin, FNP-C in collaboration with Dr. Sigrid Bathe as a  part of collaborative care agreement.   Aftin Lye R. Liana, MSN, FNP-C Internal medicine

## 2024-01-13 ENCOUNTER — Encounter: Payer: Self-pay | Admitting: Nurse Practitioner

## 2024-01-13 DIAGNOSIS — E1169 Type 2 diabetes mellitus with other specified complication: Secondary | ICD-10-CM

## 2024-01-16 ENCOUNTER — Other Ambulatory Visit: Payer: Self-pay

## 2024-01-16 DIAGNOSIS — E1169 Type 2 diabetes mellitus with other specified complication: Secondary | ICD-10-CM

## 2024-01-17 MED ORDER — DEXCOM G7 SENSOR MISC
11 refills | Status: AC
Start: 2024-01-17 — End: ?

## 2024-02-02 ENCOUNTER — Encounter: Payer: Self-pay | Admitting: Nurse Practitioner

## 2024-02-02 NOTE — Telephone Encounter (Signed)
 Lmom to call us back

## 2024-02-24 ENCOUNTER — Other Ambulatory Visit: Payer: Self-pay | Admitting: Nurse Practitioner

## 2024-02-24 DIAGNOSIS — E1122 Type 2 diabetes mellitus with diabetic chronic kidney disease: Secondary | ICD-10-CM

## 2024-02-29 ENCOUNTER — Encounter: Payer: Self-pay | Admitting: Nurse Practitioner

## 2024-02-29 DIAGNOSIS — Z79899 Other long term (current) drug therapy: Secondary | ICD-10-CM

## 2024-02-29 DIAGNOSIS — F5101 Primary insomnia: Secondary | ICD-10-CM

## 2024-03-02 ENCOUNTER — Telehealth: Payer: Self-pay | Admitting: Nurse Practitioner

## 2024-03-02 MED ORDER — ZALEPLON 10 MG PO CAPS: 20.0000 mg | ORAL_CAPSULE | Freq: Every evening | ORAL | 1 refills | Status: DC | PRN

## 2024-03-02 NOTE — Telephone Encounter (Signed)
 Notified Beth [employer] Sarah w/ [facility] of ONO order-Toni

## 2024-03-26 ENCOUNTER — Telehealth: Payer: Self-pay

## 2024-03-26 NOTE — Telephone Encounter (Signed)
 Faxed us  [facility] for [employer] [phone]

## 2024-03-28 ENCOUNTER — Telehealth: Payer: Self-pay | Admitting: Nurse Practitioner

## 2024-03-28 NOTE — Telephone Encounter (Signed)
 Received ONO results. Gave to Alyssa-Toni

## 2024-04-10 ENCOUNTER — Encounter: Payer: Managed Care, Other (non HMO) | Admitting: Nurse Practitioner

## 2024-04-13 ENCOUNTER — Other Ambulatory Visit (HOSPITAL_COMMUNITY): Payer: Self-pay

## 2024-04-13 ENCOUNTER — Encounter: Payer: Self-pay | Admitting: Oncology

## 2024-04-20 ENCOUNTER — Ambulatory Visit: Admitting: Nurse Practitioner

## 2024-04-20 ENCOUNTER — Encounter: Payer: Self-pay | Admitting: Nurse Practitioner

## 2024-04-20 VITALS — BP 120/68 | HR 78 | Temp 96.0°F | Resp 16 | Ht 64.0 in | Wt 152.0 lb

## 2024-04-20 DIAGNOSIS — N182 Chronic kidney disease, stage 2 (mild): Secondary | ICD-10-CM | POA: Insufficient documentation

## 2024-04-20 DIAGNOSIS — E785 Hyperlipidemia, unspecified: Secondary | ICD-10-CM

## 2024-04-20 DIAGNOSIS — Z794 Long term (current) use of insulin: Secondary | ICD-10-CM | POA: Diagnosis not present

## 2024-04-20 DIAGNOSIS — Z0001 Encounter for general adult medical examination with abnormal findings: Secondary | ICD-10-CM

## 2024-04-20 DIAGNOSIS — E2839 Other primary ovarian failure: Secondary | ICD-10-CM

## 2024-04-20 DIAGNOSIS — F5101 Primary insomnia: Secondary | ICD-10-CM | POA: Diagnosis not present

## 2024-04-20 DIAGNOSIS — R632 Polyphagia: Secondary | ICD-10-CM | POA: Diagnosis not present

## 2024-04-20 DIAGNOSIS — Z6826 Body mass index (BMI) 26.0-26.9, adult: Secondary | ICD-10-CM | POA: Diagnosis not present

## 2024-04-20 DIAGNOSIS — E1122 Type 2 diabetes mellitus with diabetic chronic kidney disease: Secondary | ICD-10-CM

## 2024-04-20 DIAGNOSIS — I152 Hypertension secondary to endocrine disorders: Secondary | ICD-10-CM

## 2024-04-20 DIAGNOSIS — E1169 Type 2 diabetes mellitus with other specified complication: Secondary | ICD-10-CM

## 2024-04-20 DIAGNOSIS — E663 Overweight: Secondary | ICD-10-CM

## 2024-04-20 DIAGNOSIS — E1159 Type 2 diabetes mellitus with other circulatory complications: Secondary | ICD-10-CM

## 2024-04-20 MED ORDER — ATORVASTATIN CALCIUM 10 MG PO TABS: ORAL_TABLET | ORAL | 3 refills | Status: AC

## 2024-04-20 MED ORDER — ZALEPLON 10 MG PO CAPS: 10.0000 mg | ORAL_CAPSULE | Freq: Every evening | ORAL | 2 refills | Status: AC | PRN

## 2024-04-20 MED ORDER — METFORMIN HCL 1000 MG PO TABS: 1000.0000 mg | ORAL_TABLET | Freq: Two times a day (BID) | ORAL | 3 refills | Status: AC

## 2024-04-20 MED ORDER — DIETHYLPROPION HCL ER 75 MG PO TB24: ORAL_TABLET | ORAL | 2 refills | Status: DC

## 2024-04-20 MED ORDER — BISOPROLOL FUMARATE 5 MG PO TABS: 5.0000 mg | ORAL_TABLET | Freq: Every day | ORAL | 3 refills | Status: AC

## 2024-04-20 MED ORDER — MOUNJARO 12.5 MG/0.5ML ~~LOC~~ SOAJ: SUBCUTANEOUS | 0 refills | Status: AC

## 2024-04-20 MED ORDER — PHENTERMINE HCL 37.5 MG PO TABS: 37.5000 mg | ORAL_TABLET | Freq: Every day | ORAL | 2 refills | Status: AC

## 2024-04-20 NOTE — Progress Notes (Signed)
 [facility] [address] [place], [place] [address]  Internal MEDICINE  Office Visit Note  Patient Name: Chelsea Rivera  [id number]  [id number]  Date of Service: 04/20/2024  Chief Complaint  Patient presents with   Diabetes   Gastroesophageal Reflux   Hyperlipidemia   Hypertension   Annual Exam    HPI Cloma presents for her initial medicare annual well visit.  Well-appearing 63 y.o. female with type 2 diabetes, hypertension, high cholesterol, stage 2 CKD, insomnia and history of anemia.  Routine CRC screening: due in 2027, her father passed from [facility]  Routine mammogram: due in may 2026 DEXA scan: due now  Pap smear: discontinued, aged out  Eye exam: had cataract surgery in December last year, doing well, no longer needs to wear glasses. Sees eye doctor regularly.  foot exam: done  Labs: routine labs are up to date, done with nephrology  New or worsening pain: none  Other concerns: none       04/20/2024    9:01 AM  MMSE - Mini Mental State Exam  Orientation to time 5  Orientation to Place 5  Registration 3  Attention/ Calculation 5  Recall 3  Language- name 2 objects 2  Language- repeat 1  Language- follow 3 step command 3  Language- read & follow direction 1  Write a sentence 1  Copy design 1  Total score 30     Functional Status Survey: Is the patient deaf or have difficulty hearing?: No Does the patient have difficulty seeing, even when wearing glasses/contacts?: No Does the patient have difficulty concentrating, remembering, or making decisions?: No Does the patient have difficulty walking or climbing stairs?: No Does the patient have difficulty dressing or bathing?: No Does the patient have difficulty doing errands alone such as visiting a doctor's office or shopping?: No     03/26/2022    8:28 AM 07/05/2022    8:45 AM 10/18/2022    8:08 AM 04/05/2023   10:32 AM 04/20/2024    8:15 AM  Fall Risk  Falls in the past year? 0 0 0 0 0   Was there an injury with Fall? 0  0  0   0  Fall Risk Category Calculator 0 0 0  0  Fall Risk Category (Retired) Low       (RETIRED) Patient Fall Risk Level Low fall risk       Patient at Risk for Falls Due to No Fall Risks No Fall Risks No Fall Risks    Fall risk Follow up Falls evaluation completed  Falls evaluation completed Falls evaluation completed  Falls evaluation completed     Data saved with a previous flowsheet row definition       04/20/2024    8:15 AM  Depression screen PHQ 2/9  Decreased Interest 0  Down, Depressed, Hopeless 0  PHQ - 2 Score 0        Current Medication: Outpatient Encounter Medications as of 04/20/2024  Medication Sig   phentermine  (ADIPEX-P ) 37.5 MG tablet Take 1 tablet (37.5 mg total) by mouth daily before breakfast.   aspirin EC 81 MG tablet Take 81 mg by mouth daily.   atorvastatin  (LIPITOR) 10 MG tablet TAKE 1/2 TABLET EVERYDAY FOR HIGH CHOLESTEROL   bisoprolol  (ZEBETA ) 5 MG tablet Take 1 tablet (5 mg total) by mouth at bedtime.   Continuous Glucose Receiver (DEXCOM G7 RECEIVER) DEVI by Does not apply route.   Continuous Glucose Sensor (DEXCOM G7 SENSOR) MISC Apply 1  sensor to the skin every 10 days for CGM for type 2 diabetes E11.65   ferrous sulfate  325 (65 FE) MG EC tablet Take 1 tablet (325 mg total) by mouth 3 (three) times a week.   Insulin  Disposable Pump (OMNIPOD DASH PODS, GEN 4,) MISC REPLACE POD EVERY 72 HOURS AS DIRECTED   insulin  lispro (HUMALOG ) 100 UNIT/ML injection USE AS DIRECTED WITH INSULIN  PUMP WITH OMNIPOD AND SLIDING SCALE **MAXIMUM OF 100 UNITS PER 72 hours**   metFORMIN  (GLUCOPHAGE ) 1000 MG tablet Take 1 tablet (1,000 mg total) by mouth 2 (two) times daily.   ondansetron  (ZOFRAN ) 8 MG tablet TAKE ONE TABLET BY MOUTH TWICE DAILY AS NEEDED FOR NAUSEA   tirzepatide  (MOUNJARO ) 12.5 MG/0.5ML Pen INJECT ONE SYRINGEFUL (12.5MG ) SUBCUTANEOUSLY ONCE WEEKLY   zaleplon  (SONATA ) 10 MG capsule Take 1 capsule (10 mg total) by mouth  at bedtime as needed for sleep.   [DISCONTINUED] atorvastatin  (LIPITOR) 10 MG tablet TAKE 1/2 TABLET EVERYDAY FOR HIGH CHOLESTEROL   [DISCONTINUED] bisoprolol  (ZEBETA ) 5 MG tablet Take 1 tablet (5 mg total) by mouth at bedtime.   [DISCONTINUED] clotrimazole -betamethasone  (LOTRISONE ) cream Apply 1 application topically daily. (Patient not taking: Reported on 04/20/2024)   [DISCONTINUED] dexamethasone  (DECADRON ) 0.5 MG/5ML solution Take 5 mLs (0.5 mg total) by mouth daily. SWISH AND SPIT (Patient not taking: Reported on 04/20/2024)   [DISCONTINUED] Diethylpropion  HCl CR 75 MG TB24 TAKE ONE TABLET EVERY DAY BEFORE BREAKFAST   [DISCONTINUED] Diethylpropion  HCl CR 75 MG TB24 TAKE ONE TABLET EVERY DAY BEFORE BREAKFAST   [DISCONTINUED] metFORMIN  (GLUCOPHAGE ) 1000 MG tablet Take 1 tablet (1,000 mg total) by mouth 2 (two) times daily.   [DISCONTINUED] omeprazole  (PRILOSEC) 20 MG capsule Take 1 capsule (20 mg total) by mouth daily. (Patient not taking: Reported on 04/20/2024)   [DISCONTINUED] patiromer (VELTASSA) 8.4 g packet  (Patient not taking: Reported on 04/20/2024)   [DISCONTINUED] tirzepatide  (MOUNJARO ) 12.5 MG/0.5ML Pen INJECT ONE SYRINGEFUL (12.5MG ) SUBCUTANEOUSLY ONCE WEEKLY   [DISCONTINUED] zaleplon  (SONATA ) 10 MG capsule Take 2 capsules (20 mg total) by mouth at bedtime as needed for sleep.   No facility-administered encounter medications on file as of 04/20/2024.    Surgical History: Past Surgical History:  Procedure Laterality Date   BREAST BIOPSY Right 2012   cleaned out fistula, not a biopsy   CHOLECYSTECTOMY     COLONOSCOPY WITH PROPOFOL  N/A 07/07/2018   Procedure: COLONOSCOPY WITH PROPOFOL ;  Surgeon: Gaylyn Gladis PENNER, MD;  Location: [facility] ENDOSCOPY;  Service: Endoscopy;  Laterality: N/A;   COLONOSCOPY WITH PROPOFOL  N/A 07/27/2021   Procedure: COLONOSCOPY WITH PROPOFOL ;  Surgeon: Onita Elspeth Sharper, DO;  Location: [facility] ENDOSCOPY;  Service: Gastroenterology;  Laterality: N/A;    ESOPHAGOGASTRODUODENOSCOPY (EGD) WITH PROPOFOL  N/A 07/27/2021   Procedure: ESOPHAGOGASTRODUODENOSCOPY (EGD) WITH PROPOFOL ;  Surgeon: Onita Elspeth Sharper, DO;  Location: [facility] ENDOSCOPY;  Service: Gastroenterology;  Laterality: N/A;   SHOULDER ARTHROSCOPY      Medical History: Past Medical History:  Diagnosis Date   Diabetes mellitus without complication (HCC)    GERD (gastroesophageal reflux disease)    Hyperlipidemia    Hypertension    IDA (iron  deficiency anemia) 01/27/2021    Family History: Family History  Problem Relation Age of Onset   Lung cancer Mother    Colon cancer Father    Breast cancer Neg Hx     Social History   Socioeconomic History   Marital status: Married    Spouse name: Not on file   Number of children: Not on file  Years of education: Not on file   Highest education level: Not on file  Occupational History   Not on file  Tobacco Use   Smoking status: Never   Smokeless tobacco: Never  Vaping Use   Vaping status: Never Used  Substance and Sexual Activity   Alcohol use: Yes    Comment: socially   Drug use: No   Sexual activity: Not on file  Other Topics Concern   Not on file  Social History Narrative   Not on file   Social Drivers of Health   Tobacco Use: Low Risk (04/20/2024)   Patient History    Smoking Tobacco Use: Never    Smokeless Tobacco Use: Never    Passive Exposure: Not on file  Financial Resource Strain: Not on file  Food Insecurity: No Food Insecurity (04/20/2024)   Epic    Worried About Programme Researcher, Broadcasting/film/video in the Last Year: Never true    Ran Out of Food in the Last Year: Never true  Transportation Needs: No Transportation Needs (04/20/2024)   Epic    Lack of Transportation (Medical): No    Lack of Transportation (Non-Medical): No  Physical Activity: Not on file  Stress: No Stress Concern Present (04/20/2024)   [employer] of Occupational Health - Occupational Stress Questionnaire    Feeling of Stress: Only a  little  Social Connections: Not on file  Intimate Partner Violence: Not on file  Depression [phone]): Low Risk (04/20/2024)   Depression (PHQ2-9)    PHQ-2 Score: 0  Alcohol Screen: Low Risk (04/05/2023)   Alcohol Screen    Last Alcohol Screening Score (AUDIT): 2  Housing: Unknown (04/20/2024)   Epic    Unable to Pay for Housing in the Last Year: No    Number of Times Moved in the Last Year: Not on file    Homeless in the Last Year: No  Utilities: Not At Risk (04/20/2024)   Epic    Threatened with loss of utilities: No  Health Literacy: Adequate Health Literacy (04/20/2024)   B1300 Health Literacy    Frequency of need for help with medical instructions: Never      Review of Systems  Constitutional:  Negative for activity change, appetite change, chills, fatigue, fever and unexpected weight change.  HENT: Negative.  Negative for congestion, ear pain, rhinorrhea, sore throat and trouble swallowing.   Eyes: Negative.   Respiratory: Negative.  Negative for cough, chest tightness, shortness of breath and wheezing.   Cardiovascular: Negative.  Negative for chest pain.  Gastrointestinal: Negative.  Negative for abdominal pain, blood in stool, constipation, diarrhea, nausea and vomiting.  Endocrine: Negative.   Genitourinary: Negative.  Negative for difficulty urinating, dysuria, frequency, hematuria and urgency.  Musculoskeletal: Negative.  Negative for arthralgias, back pain, joint swelling, myalgias and neck pain.  Skin: Negative.  Negative for rash and wound.  Allergic/Immunologic: Negative.  Negative for immunocompromised state.  Neurological: Negative.  Negative for dizziness, seizures, numbness and headaches.  Hematological: Negative.   Psychiatric/Behavioral: Negative.  Negative for behavioral problems, self-injury and suicidal ideas. The patient is not nervous/anxious.     Vital Signs: BP 120/68   Pulse 78   Temp (!) 96 F (35.6 C)   Resp 16   Ht 5' 4 (1.626 m)   Wt  152 lb (68.9 kg)   SpO2 97%   BMI 26.09 kg/m    Physical Exam Vitals reviewed.  Constitutional:      General: She is awake. She is not in  acute distress.    Appearance: Normal appearance. She is well-developed and well-groomed. She is obese. She is not ill-appearing or diaphoretic.  HENT:     Head: Normocephalic and atraumatic.     Right Ear: Tympanic membrane, ear canal and external ear normal.     Left Ear: Tympanic membrane, ear canal and external ear normal.     Nose: Nose normal.     Mouth/Throat:     Lips: Pink.     Mouth: Mucous membranes are moist.     Pharynx: Oropharynx is clear. Uvula midline. No oropharyngeal exudate or posterior oropharyngeal erythema.  Eyes:     General: Lids are normal. Vision grossly intact. Gaze aligned appropriately. No scleral icterus.       Right eye: No discharge.        Left eye: No discharge.     Conjunctiva/sclera: Conjunctivae normal.     Pupils: Pupils are equal, round, and reactive to light.     Funduscopic exam:    Right eye: Red reflex present.        Left eye: Red reflex present. Neck:     Thyroid: No thyromegaly.     Vascular: No carotid bruit or JVD.     Trachea: Trachea and phonation normal. No tracheal deviation.  Cardiovascular:     Rate and Rhythm: Normal rate and regular rhythm.     Pulses: Normal pulses.     Heart sounds: Normal heart sounds, S1 normal and S2 normal. No murmur heard.    No friction rub. No gallop.  Pulmonary:     Effort: Pulmonary effort is normal. No accessory muscle usage or respiratory distress.     Breath sounds: Normal breath sounds and air entry. No stridor. No wheezing, rhonchi or rales.  Chest:     Chest wall: No tenderness.     Comments: Declined breast exam, will get mammogram Abdominal:     General: Bowel sounds are normal. There is no distension.     Palpations: Abdomen is soft. There is no shifting dullness, fluid wave, mass or pulsatile mass.     Tenderness: There is no abdominal  tenderness. There is no guarding or rebound.  Musculoskeletal:        General: No tenderness or deformity. Normal range of motion.     Cervical back: Normal range of motion and neck supple.     Right lower leg: No edema.     Left lower leg: No edema.  Lymphadenopathy:     Cervical: No cervical adenopathy.  Skin:    General: Skin is warm and dry.     Capillary Refill: Capillary refill takes less than 2 seconds.     Coloration: Skin is not pale.     Findings: No erythema or rash.  Neurological:     Mental Status: She is alert and oriented to person, place, and time.     Cranial Nerves: No cranial nerve deficit.     Motor: No abnormal muscle tone.     Coordination: Coordination normal.     Gait: Gait normal.     Deep Tendon Reflexes: Reflexes are normal and symmetric.  Psychiatric:        Mood and Affect: Mood and affect normal.        Behavior: Behavior normal. Behavior is cooperative.        Thought Content: Thought content normal.        Judgment: Judgment normal.    Title   Diabetic Foot Exam - detailed  Visual Foot Exam completed.: Yes  Is there a history of foot ulcer?: No Is there a foot ulcer now?: No Is there swelling?: No Is there elevated skin temperature?: No Is there abnormal foot shape?: No Is there a claw toe deformity?: No Are the toenails long?: No Are the toenails thick?: No Are the toenails ingrown?: No Is the skin thin, fragile, shiny and hairless?: No Normal Range of Motion?: Yes Is there foot or ankle muscle weakness?: No Do you have pain in calf while walking?: No Are the shoes appropriate in style and fit?: Yes Can the patient see the bottom of their feet?: Yes Pulse Foot Exam completed.: Yes   Right Posterior Tibialis: Present Left posterior Tibialis: Present   Right Dorsalis Pedis: Present Left Dorsalis Pedis: Present     Semmes-Weinstein Monofilament Test + means has sensation and - means no sensation  R Foot Test Control: Pos L  Foot Test Control: Pos   R Site 1-Great Toe: Pos L Site 1-Great Toe: Pos   R Site 4: Pos L Site 4: Pos   R site 5: Pos L Site 5: Pos  R Site 6: Pos L Site 6: Pos     Image components are not supported.   Image components are not supported. Image components are not supported.  [place] Comments        Assessment/Plan: 1. Encounter for Medicare annual examination with abnormal findings (Primary) This is her first initial medicare AWV. Age-appropriate preventive screenings and vaccinations discussed. Routine labs for health maintenance are up to date for now. PHM updated.    2. Type 2 diabetes mellitus with stage 2 chronic kidney disease, with long-term current use of insulin  (HCC) Continue mounjaro  and metformin  as prescribed. Repeat A1c in 3 months  - tirzepatide  (MOUNJARO ) 12.5 MG/0.5ML Pen; INJECT ONE SYRINGEFUL (12.5MG ) SUBCUTANEOUSLY ONCE WEEKLY  Dispense: 12 mL; Refill: 0 - metFORMIN  (GLUCOPHAGE ) 1000 MG tablet; Take 1 tablet (1,000 mg total) by mouth 2 (two) times daily.  Dispense: 180 tablet; Refill: 3  3. Hypertension associated with type 2 diabetes mellitus (HCC) Stable, Continue bisprolol as prescribed  - bisoprolol  (ZEBETA ) 5 MG tablet; Take 1 tablet (5 mg total) by mouth at bedtime.  Dispense: 90 tablet; Refill: 3  4. Hyperlipidemia associated with type 2 diabetes mellitus (HCC) Continue atorvastatin  as prescribed.  - atorvastatin  (LIPITOR) 10 MG tablet; TAKE 1/2 TABLET EVERYDAY FOR HIGH CHOLESTEROL  Dispense: 45 tablet; Refill: 3  5. CKD (chronic kidney disease) stage 2, GFR 60-89 ml/min Followed [facility] Dr. Douglas [facility].   6. Ovarian failure due to menopause Dexa scan ordered  - DG Bone Density; Future  7. Increased appetite Discontinue diethylpropion  and start phentermine  as prescribed.  - phentermine  (ADIPEX-P ) 37.5 MG tablet; Take 1 tablet (37.5 mg total) by mouth daily before breakfast.  Dispense: 30 tablet; Refill: 2  8.  Overweight with body mass index (BMI) of 26 to 26.9 in adult Discontinue diethylpropion  and start phentermine  as prescribed.   9. Primary insomnia Zaleplon  dose decreased to 10 mg at bedtime. Follow up in 3 months for refills - zaleplon  (SONATA ) 10 MG capsule; Take 1 capsule (10 mg total) by mouth at bedtime as needed for sleep.  Dispense: 30 capsule; Refill: 2      General Counseling: akanksha bellmore understanding of the findings of todays visit and agrees with plan of treatment. I have discussed any further diagnostic evaluation that may be needed or ordered today. We also reviewed her  medications today. she has been encouraged to call the office with any questions or concerns that should arise related to todays visit.    Orders Placed This Encounter  Procedures   DG Bone Density    Meds ordered this encounter  Medications   zaleplon  (SONATA ) 10 MG capsule    Sig: Take 1 capsule (10 mg total) by mouth at bedtime as needed for sleep.    Dispense:  30 capsule    Refill:  2    Note decreased dose, for future refills   tirzepatide  (MOUNJARO ) 12.5 MG/0.5ML Pen    Sig: INJECT ONE SYRINGEFUL (12.5MG ) SUBCUTANEOUSLY ONCE WEEKLY    Dispense:  12 mL    Refill:  0   DISCONTD: Diethylpropion  HCl CR 75 MG TB24    Sig: TAKE ONE TABLET EVERY DAY BEFORE BREAKFAST    Dispense:  30 tablet    Refill:  2    FOR FUTURE REFILL PLEASE, keep on file   atorvastatin  (LIPITOR) 10 MG tablet    Sig: TAKE 1/2 TABLET EVERYDAY FOR HIGH CHOLESTEROL    Dispense:  45 tablet    Refill:  3    For next fill   bisoprolol  (ZEBETA ) 5 MG tablet    Sig: Take 1 tablet (5 mg total) by mouth at bedtime.    Dispense:  90 tablet    Refill:  3    For next fill   metFORMIN  (GLUCOPHAGE ) 1000 MG tablet    Sig: Take 1 tablet (1,000 mg total) by mouth 2 (two) times daily.    Dispense:  180 tablet    Refill:  3    For future refills   phentermine  (ADIPEX-P ) 37.5 MG tablet    Sig: Take 1 tablet (37.5 mg total) by  mouth daily before breakfast.    Dispense:  30 tablet    Refill:  2    Discontinue diethylpropion  and fill new script today    Return in about 3 months (around 07/12/2024) for F/U, Recheck A1C, Prarthana Parlin PCP sonata  and phentermine  refills. .   Total time spent:30 Minutes Time spent includes review of chart, medications, test results, and follow up plan with the patient.   [place] Controlled Substance Database was reviewed by me.  This patient was seen by Mardy Maxin, FNP-C in collaboration with Dr. Sigrid Bathe as a part of collaborative care agreement.  Armari Fussell R. Maxin, MSN, FNP-C Internal medicine

## 2024-05-13 ENCOUNTER — Encounter: Payer: Self-pay | Admitting: Oncology

## 2024-05-17 ENCOUNTER — Encounter: Payer: Self-pay | Admitting: Nurse Practitioner

## 2024-05-24 ENCOUNTER — Telehealth: Payer: Self-pay

## 2024-05-24 NOTE — Telephone Encounter (Signed)
 Completed P.A. for patient's Dexcom G7 Receiver.

## 2024-07-20 ENCOUNTER — Ambulatory Visit: Admitting: Nurse Practitioner

## 2025-04-23 ENCOUNTER — Encounter: Admitting: Nurse Practitioner
